# Patient Record
Sex: Female | Born: 1995 | Race: Black or African American | Hispanic: No | Marital: Single | Smoking: Never smoker
Health system: Southern US, Community
[De-identification: ages and names within clinical notes are randomized; demographics above are authoritative.]

## PROBLEM LIST (undated history)

## (undated) DIAGNOSIS — Z3201 Encounter for pregnancy test, result positive: Secondary | ICD-10-CM

## (undated) DIAGNOSIS — E282 Polycystic ovarian syndrome: Secondary | ICD-10-CM

## (undated) DIAGNOSIS — A6000 Herpesviral infection of urogenital system, unspecified: Secondary | ICD-10-CM

## (undated) DIAGNOSIS — R102 Pelvic and perineal pain: Secondary | ICD-10-CM

## (undated) DIAGNOSIS — Z3009 Encounter for other general counseling and advice on contraception: Principal | ICD-10-CM

## (undated) DIAGNOSIS — K219 Gastro-esophageal reflux disease without esophagitis: Secondary | ICD-10-CM

## (undated) DIAGNOSIS — R109 Unspecified abdominal pain: Secondary | ICD-10-CM

## (undated) DIAGNOSIS — N912 Amenorrhea, unspecified: Principal | ICD-10-CM

## (undated) DIAGNOSIS — A749 Chlamydial infection, unspecified: Secondary | ICD-10-CM

## (undated) HISTORY — DX: Unspecified abdominal pain: R10.9

## (undated) HISTORY — DX: Amenorrhea, unspecified: N91.2

## (undated) HISTORY — DX: Encounter for pregnancy test, result positive: Z32.01

## (undated) HISTORY — DX: Pelvic and perineal pain: R10.2

## (undated) HISTORY — DX: Gastro-esophageal reflux disease without esophagitis: K21.9

## (undated) HISTORY — PX: DILATION AND CURETTAGE OF UTERUS: SHX78

## (undated) HISTORY — DX: Encounter for other general counseling and advice on contraception: Z30.09

## (undated) HISTORY — DX: Polycystic ovarian syndrome: E28.2

---

## 2013-10-17 ENCOUNTER — Emergency Department (HOSPITAL_COMMUNITY)
Admission: EM | Admit: 2013-10-17 | Discharge: 2013-10-17 | Disposition: A | Discharge status: Home / Self Care | Payer: Medicaid Other | Attending: Emergency Medicine | Admitting: Emergency Medicine

## 2013-10-17 ENCOUNTER — Encounter (HOSPITAL_COMMUNITY): Payer: Self-pay | Admitting: Emergency Medicine

## 2013-10-17 DIAGNOSIS — N9089 Other specified noninflammatory disorders of vulva and perineum: Secondary | ICD-10-CM | POA: Insufficient documentation

## 2013-10-17 DIAGNOSIS — N907 Vulvar cyst: Secondary | ICD-10-CM

## 2013-10-17 DIAGNOSIS — F17200 Nicotine dependence, unspecified, uncomplicated: Secondary | ICD-10-CM | POA: Insufficient documentation

## 2013-10-17 DIAGNOSIS — Z8619 Personal history of other infectious and parasitic diseases: Secondary | ICD-10-CM | POA: Insufficient documentation

## 2013-10-17 DIAGNOSIS — Z9889 Other specified postprocedural states: Secondary | ICD-10-CM | POA: Insufficient documentation

## 2013-10-17 HISTORY — DX: Chlamydial infection, unspecified: A74.9

## 2013-10-17 LAB — WET PREP, GENITAL
Clue Cells Wet Prep HPF POC: NONE SEEN
Trich, Wet Prep: NONE SEEN
WBC WET PREP: NONE SEEN
Yeast Wet Prep HPF POC: NONE SEEN

## 2013-10-17 MED ORDER — CEPHALEXIN 500 MG PO CAPS: 500.0000 mg | ORAL_CAPSULE | Freq: Three times a day (TID) | ORAL | Status: DC

## 2013-10-17 NOTE — Discharge Instructions (Signed)
Your exam today shows that you have a small cystic area of the right labia. We are starting you on antibiotics to cover for possible infection. You will need to follow up with Dr. Emelda FearFerguson for your pap smear and yearly physical and recheck of the cystic area. Return here as needed for problems.

## 2013-10-17 NOTE — ED Notes (Signed)
Pt noticed "sore" on right inner labia yesterday. No pain unlesspalpated. Denies drainage. Admits to unprotected intercourse

## 2013-10-17 NOTE — ED Notes (Signed)
Pt with "bump on rt labia/vagina". States she had unprotected sex last week. Painful only to touch.

## 2013-10-17 NOTE — ED Provider Notes (Signed)
CSN: [id number]     Arrival date & time 10/17/13  1025 History  This chart was scribed for non-physician practitioner, Kerrie BuffaloHope Fatumata Kashani, FNP,working with Joya Gaskinsonald W Wickline, MD, by Karle PlumberJennifer Tensley, ED Scribe.  This patient was seen in room APFT23/APFT23 and the patient's care was started at 11:36 AM.  Chief Complaint  Patient presents with  . Abscess   The history is provided by the patient. No language interpreter was used.   HPI Comments:  Courtney Zhang is a 18 y.o. female who presents to the Emergency Department complaining of an abscess on the inside of her right labia onset yesterday. She reports associated pain with palpation of the area. Pt reports that she feels as if she has to urinate but cannot. She also reports a slight vaginal discharge. She states her last pap smear was 2 years ago and was diagnosed with chlamydia in which she was treated. She reports one pregnancy which resulted in a still birth at this same time. Pt reports a new sexual partner within the past month and states they have had unprotected sex. She denies dysuria, fever, chills, abdominal pain, back pain, nausea, vomiting, HA, dizziness, vaginal bleeding, otalgia, visual changes, swelling of hands or feet. She states her LMP was 09/25/13.   Past Medical History  Diagnosis Date  . Chlamydia    Past Surgical History  Procedure Laterality Date  . Dilation and curettage of uterus     History reviewed. No pertinent family history. History  Substance Use Topics  . Smoking status: Current Every Day Smoker    Types: Cigars  . Smokeless tobacco: Not on file  . Alcohol Use: No   OB History   Grav Para Term Preterm Abortions TAB SAB Ect Mult Living   1 0             Review of Systems  Constitutional: Negative for fever and chills.  HENT: Negative for ear pain.   Eyes: Negative for visual disturbance.  Gastrointestinal: Negative for nausea, vomiting and abdominal pain.  Genitourinary: Positive for urgency, vaginal  discharge and genital sores (abscess on inner labia). Negative for dysuria and vaginal bleeding.  Musculoskeletal: Negative for back pain.  Neurological: Negative for dizziness and headaches.  All other systems reviewed and are negative.   Allergies  Review of patient's allergies indicates no known allergies.  Home Medications   Prior to Admission medications   Not on File   Triage Vitals: BP 135/78  Pulse 80  Temp(Src) 97.9 F (36.6 C) (Oral)  Resp 19  SpO2 100%  LMP 09/23/2013 Physical Exam  Nursing note and vitals reviewed. Constitutional: She is oriented to person, place, and time. She appears well-developed and well-nourished.  Eyes: EOM are normal.  Neck: Neck supple.  Pulmonary/Chest: Effort normal.  Abdominal: Soft. There is no tenderness.  Genitourinary:  A BB sized cystic area in right labia. External genitalia without lesions. White discharge in vaginal vault. No CMT. No adnexal tenderness. No mass palpated. Uterus without palpated enlargement.  Musculoskeletal: Normal range of motion.  Neurological: She is alert and oriented to person, place, and time. No cranial nerve deficit.  Skin: Skin is warm and dry.    ED Course  Procedures (including critical care time) DIAGNOSTIC STUDIES: Oxygen Saturation is 100% on RA, normal by my interpretation.   COORDINATION OF CARE: 11:45 AM- Will perform pelvic exam. Pt verbalizes understanding and agrees to plan.  Medications - No data to display Results for orders placed during the hospital encounter  of 10/17/13 (from the past 24 hour(s))  WET PREP, GENITAL     Status: None   Collection Time    10/17/13 12:15 PM      Result Value Ref Range   Yeast Wet Prep HPF POC NONE SEEN  NONE SEEN   Trich, Wet Prep NONE SEEN  NONE SEEN   Clue Cells Wet Prep HPF POC NONE SEEN  NONE SEEN   WBC, Wet Prep HPF POC NONE SEEN  NONE SEEN     MDM  18 y.o. female with tenderness and swelling to the right labia x 24 hours. Will start  antibiotics and she will use sitz baths. She will follow up with Dr. Emelda FearFerguson for her pap smear and re check of the area. She will return here as needed for problems. I have reviewed this patient's vital signs, nurses notes, appropriate labs and discussed findings and plan of care with the patient. She voices understanding and agrees to plan.    Medication List         cephALEXin 500 MG capsule  Commonly known as:  KEFLEX  Take 1 capsule (500 mg total) by mouth 3 (three) times daily.        I personally performed the services described in this documentation, which was scribed in my presence. The recorded information has been reviewed and is accurate.    [facility] Orlene OchM Jovanni Eckhart, [place]NP 10/17/13 [phone]

## 2013-10-18 LAB — GC/CHLAMYDIA PROBE AMP
CT PROBE, AMP APTIMA: NEGATIVE
GC PROBE AMP APTIMA: NEGATIVE

## 2013-10-18 NOTE — ED Provider Notes (Signed)
Medical screening examination/treatment/procedure(s) were performed by non-physician practitioner and as supervising physician I was immediately available for consultation/collaboration.   EKG Interpretation None        Eliu Batch W Micholas Drumwright, MD 10/18/13 1243 

## 2013-10-27 ENCOUNTER — Encounter: Payer: Self-pay | Admitting: Adult Health

## 2013-11-07 ENCOUNTER — Encounter: Payer: Self-pay | Admitting: Adult Health

## 2013-11-07 ENCOUNTER — Ambulatory Visit (INDEPENDENT_AMBULATORY_CARE_PROVIDER_SITE_OTHER): Payer: Medicaid Other | Admitting: Adult Health

## 2013-11-07 VITALS — BP 120/86 | Ht 68.0 in | Wt 250.0 lb

## 2013-11-07 DIAGNOSIS — Z3009 Encounter for other general counseling and advice on contraception: Secondary | ICD-10-CM

## 2013-11-07 HISTORY — DX: Encounter for other general counseling and advice on contraception: Z30.09

## 2013-11-07 NOTE — Patient Instructions (Signed)
Etonogestrel implant What is this medicine? ETONOGESTREL (et oh noe JES trel) is a contraceptive (birth control) device. It is used to prevent pregnancy. It can be used for up to 3 years. This medicine may be used for other purposes; ask your health care provider or pharmacist if you have questions. COMMON BRAND NAME(S): Implanon, Nexplanon  What should I tell my health care provider before I take this medicine? They need to know if you have any of these conditions: -abnormal vaginal bleeding -blood vessel disease or blood clots -cancer of the breast, cervix, or liver -depression -diabetes -gallbladder disease -headaches -heart disease or recent heart attack -high blood pressure -high cholesterol -kidney disease -liver disease -renal disease -seizures -tobacco smoker -an unusual or allergic reaction to etonogestrel, other hormones, anesthetics or antiseptics, medicines, foods, dyes, or preservatives -pregnant or trying to get pregnant -breast-feeding How should I use this medicine? This device is inserted just under the skin on the inner side of your upper arm by a health care professional. Talk to your pediatrician regarding the use of this medicine in children. Special care may be needed. Overdosage: If you think you've taken too much of this medicine contact a poison control center or emergency room at once. Overdosage: If you think you have taken too much of this medicine contact a poison control center or emergency room at once. NOTE: This medicine is only for you. Do not share this medicine with others. What if I miss a dose? This does not apply. What may interact with this medicine? Do not take this medicine with any of the following medications: -amprenavir -bosentan -fosamprenavir This medicine may also interact with the following medications: -barbiturate medicines for inducing sleep or treating seizures -certain medicines for fungal infections like ketoconazole and  itraconazole -griseofulvin -medicines to treat seizures like carbamazepine, felbamate, oxcarbazepine, phenytoin, topiramate -modafinil -phenylbutazone -rifampin -some medicines to treat HIV infection like atazanavir, indinavir, lopinavir, nelfinavir, tipranavir, ritonavir -St. John's wort This list may not describe all possible interactions. Give your health care provider a list of all the medicines, herbs, non-prescription drugs, or dietary supplements you use. Also tell them if you smoke, drink alcohol, or use illegal drugs. Some items may interact with your medicine. What should I watch for while using this medicine? This product does not protect you against HIV infection (AIDS) or other sexually transmitted diseases. You should be able to feel the implant by pressing your fingertips over the skin where it was inserted. Tell your doctor if you cannot feel the implant. What side effects may I notice from receiving this medicine? Side effects that you should report to your doctor or health care professional as soon as possible: -allergic reactions like skin rash, itching or hives, swelling of the face, lips, or tongue -breast lumps -changes in vision -confusion, trouble speaking or understanding -dark urine -depressed mood -general ill feeling or flu-like symptoms -light-colored stools -loss of appetite, nausea -right upper belly pain -severe headaches -severe pain, swelling, or tenderness in the abdomen -shortness of breath, chest pain, swelling in a leg -signs of pregnancy -sudden numbness or weakness of the face, arm or leg -trouble walking, dizziness, loss of balance or coordination -unusual vaginal bleeding, discharge -unusually weak or tired -yellowing of the eyes or skin Side effects that usually do not require medical attention (Report these to your doctor or health care professional if they continue or are bothersome.): -acne -breast pain -changes in  weight -cough -fever or chills -headache -irregular menstrual bleeding -itching, burning,   and vaginal discharge -pain or difficulty passing urine -sore throat This list may not describe all possible side effects. Call your doctor for medical advice about side effects. You may report side effects to FDA at 1-800-FDA-1088. Where should I keep my medicine? This drug is given in a hospital or clinic and will not be stored at home. NOTE: This sheet is a summary. It may not cover all possible information. If you have questions about this medicine, talk to your doctor, pharmacist, or health care provider.  2014, Elsevier/Gold Standard. (2011-12-01 15:37:45) No sex x 2 weeks before nexplanon Return in 4 weeks for stat [facility] in am and insertion in pm

## 2013-11-07 NOTE — Progress Notes (Signed)
Subjective:     Patient ID: Courtney Zhang, female   DOB: [birth year], 18 y.o.   MRN: [id number]  HPI  Courtney Zhang is a 18 year old black female in to discuss birth control, she is G1P0A1, had at 16 weeks and had D&C.She is not currently having sex, just moved to area.  Review of Systems See HPI Reviewed past medical,surgical, social and family history. Reviewed medications and allergies.     Objective:   Physical Exam BP 120/86  Ht 5\' 8"  (1.727 m)  Wt 250 lb (113.399 kg)  BMI 38.02 kg/m2  LMP 10/28/2013   Talk only, wants birth control, but not sure which one,discussed OCs, depo, ring, nexplanon and IUD and she wants nexplanon.Discussed always using condoms for STD prevention, she said she had chlamydia when she was pregnant.   Assessment:     Contraceptive counseling    Plan:    Order nexplanon Return in 4 weeks for stat [facility] in am and insertion in pm No sex x 2 weeks before insertion if not on period   Review handout on nexplanon

## 2013-12-05 ENCOUNTER — Encounter: Payer: Medicaid Other | Admitting: Adult Health

## 2013-12-05 ENCOUNTER — Other Ambulatory Visit: Payer: Medicaid Other

## 2013-12-08 ENCOUNTER — Encounter: Payer: Medicaid Other | Admitting: Adult Health

## 2013-12-08 ENCOUNTER — Other Ambulatory Visit: Payer: Medicaid Other

## 2014-01-02 ENCOUNTER — Encounter: Payer: Medicaid Other | Admitting: Adult Health

## 2014-01-02 ENCOUNTER — Other Ambulatory Visit: Payer: Medicaid Other

## 2014-02-02 ENCOUNTER — Emergency Department (HOSPITAL_COMMUNITY)
Admission: EM | Admit: 2014-02-02 | Discharge: 2014-02-02 | Disposition: A | Discharge status: Home / Self Care | Payer: Medicaid Other | Attending: Emergency Medicine | Admitting: Emergency Medicine

## 2014-02-02 ENCOUNTER — Encounter (HOSPITAL_COMMUNITY): Payer: Self-pay | Admitting: Emergency Medicine

## 2014-02-02 DIAGNOSIS — Z3200 Encounter for pregnancy test, result unknown: Secondary | ICD-10-CM | POA: Diagnosis present

## 2014-02-02 DIAGNOSIS — Z8619 Personal history of other infectious and parasitic diseases: Secondary | ICD-10-CM | POA: Insufficient documentation

## 2014-02-02 DIAGNOSIS — Z3202 Encounter for pregnancy test, result negative: Secondary | ICD-10-CM | POA: Diagnosis not present

## 2014-02-02 DIAGNOSIS — N926 Irregular menstruation, unspecified: Secondary | ICD-10-CM | POA: Insufficient documentation

## 2014-02-02 DIAGNOSIS — Z87891 Personal history of nicotine dependence: Secondary | ICD-10-CM | POA: Diagnosis not present

## 2014-02-02 DIAGNOSIS — N390 Urinary tract infection, site not specified: Secondary | ICD-10-CM | POA: Diagnosis not present

## 2014-02-02 DIAGNOSIS — R42 Dizziness and giddiness: Secondary | ICD-10-CM | POA: Insufficient documentation

## 2014-02-02 DIAGNOSIS — N939 Abnormal uterine and vaginal bleeding, unspecified: Secondary | ICD-10-CM | POA: Insufficient documentation

## 2014-02-02 LAB — URINALYSIS, ROUTINE W REFLEX MICROSCOPIC
Bilirubin Urine: NEGATIVE
Glucose, UA: NEGATIVE mg/dL
HGB URINE DIPSTICK: NEGATIVE
Ketones, ur: NEGATIVE mg/dL
Nitrite: NEGATIVE
SPECIFIC GRAVITY, URINE: 1.02 (ref 1.005–1.030)
UROBILINOGEN UA: 1 mg/dL (ref 0.0–1.0)
pH: 7 (ref 5.0–8.0)

## 2014-02-02 LAB — URINE MICROSCOPIC-ADD ON

## 2014-02-02 LAB — POC URINE PREG, ED: Preg Test, Ur: NEGATIVE

## 2014-02-02 MED ORDER — CEPHALEXIN 500 MG PO CAPS: 500.0000 mg | ORAL_CAPSULE | Freq: Four times a day (QID) | ORAL | Status: DC

## 2014-02-02 MED ORDER — CEPHALEXIN 500 MG PO CAPS
500.0000 mg | ORAL_CAPSULE | Freq: Once | ORAL | Status: AC
  Administered 2014-02-02: 500 mg via ORAL
  Filled 2014-02-02: qty 1

## 2014-02-02 NOTE — Discharge Instructions (Signed)

## 2014-02-02 NOTE — ED Notes (Signed)
Pt ready for d/c when seen by me.  Alert, NAD 

## 2014-02-02 NOTE — ED Notes (Addendum)
Pt states she started feeling dizzy yesterday with nausea, pt states has had pain in her abdomen as well that comes and goes, denies pain at the moment. Pt has not had a period in 2 months, states she thinks she is pregnant.

## 2014-02-04 NOTE — ED Provider Notes (Signed)
CSN: [id number]     Arrival date & time 02/02/14  1548 History   First MD Initiated Contact with Patient 02/02/14 1730     Chief Complaint  Patient presents with  . Possible Pregnancy   Courtney Zhang is a 18 y.o. female who presents to the Emergency Department complaining of intermittent abdominal cramping for several days and also reports having developed dizziness and nausea one day prior to ED arrival.  She states that she has missed her period for two months and is concerned that she may be pregnant and is requesting a pregnancy test.  She denies pain at this time, recent fever, vomiting, vaginal discharge, urinary frequency, or back pain   (Consider location/radiation/quality/duration/timing/severity/associated sxs/prior Treatment) Patient is a 18 y.o. female presenting with pregnancy problem.  Possible Pregnancy  Primary symptoms include abdominal pain. Patient reports no vaginal bleeding and no vaginal discharge.  Associated symptoms include nausea.  Associated symptoms include no dysuria, no fever, no shortness of breath, no vomiting and no weakness.    Past Medical History  Diagnosis Date  . Chlamydia   . Contraceptive education 11/07/2013   Past Surgical History  Procedure Laterality Date  . Dilation and curettage of uterus     Family History  Problem Relation Age of Onset  . Asthma Brother   . Diabetes Maternal Grandmother   . Hypertension Maternal Grandmother   . Diabetes Maternal Grandfather   . Diabetes Paternal Grandmother   . Asthma Brother   . Asthma Brother   . Asthma Brother    History  Substance Use Topics  . Smoking status: Former Smoker    Types: Cigars  . Smokeless tobacco: Never Used  . Alcohol Use: No   OB History   Grav Para Term Preterm Abortions TAB SAB Ect Mult Living   1 0        0     Review of Systems  Constitutional: Negative for fever, chills and fatigue.  HENT: Negative for sore throat and trouble swallowing.   Respiratory:  Negative for cough, shortness of breath and wheezing.   Cardiovascular: Negative for chest pain and palpitations.  Gastrointestinal: Positive for nausea and abdominal pain. Negative for vomiting and blood in stool.  Endocrine: Negative for polydipsia and polyuria.  Genitourinary: Positive for menstrual problem. Negative for dysuria, urgency, hematuria, flank pain, vaginal bleeding, vaginal discharge, difficulty urinating and pelvic pain.  Musculoskeletal: Negative for arthralgias, back pain, myalgias, neck pain and neck stiffness.  Skin: Negative for rash.  Neurological: Positive for dizziness. Negative for weakness and numbness.  Hematological: Does not bruise/bleed easily.      Allergies  Review of patient's allergies indicates no known allergies.  Home Medications   Prior to Admission medications   Medication Sig Start Date End Date Taking? Authorizing Provider  cephALEXin (KEFLEX) 500 MG capsule Take 1 capsule (500 mg total) by mouth 4 (four) times daily. For 7 days 02/02/14   Kaisley Stiverson L. Romie Tay, PA-C   BP 125/89  Pulse 85  Temp(Src) 98.7 F (37.1 C) (Oral)  Resp 17  Ht  (1.727 m)  Wt 246 lb (111.585 kg)  BMI 37.41 kg/m2  SpO2 100%  LMP 11/26/2013 Physical Exam  Nursing note and vitals reviewed. Constitutional: She is oriented to person, place, and time. She appears well-developed and well-nourished. No distress.  HENT:  Head: Normocephalic and atraumatic.  Neck: Normal range of motion. Neck supple.  Cardiovascular: Normal rate, regular rhythm, normal heart sounds and intact distal pulses.  No murmur heard. Pulmonary/Chest: Effort normal and breath sounds normal. No respiratory distress.  Abdominal: Soft. She exhibits no distension and no mass. There is no tenderness. There is no rebound and no guarding.  Musculoskeletal: Normal range of motion. She exhibits no tenderness.  Lymphadenopathy:    She has no cervical adenopathy.  Neurological: She is alert and  oriented to person, place, and time. She exhibits normal muscle tone. Coordination normal.  Skin: Skin is warm and dry. No rash noted.  Psychiatric: She has a normal mood and affect. Her behavior is normal. Thought content normal.    ED Course  Procedures (including critical care time) Labs Review Labs Reviewed  URINALYSIS, ROUTINE W REFLEX MICROSCOPIC - Abnormal; Notable for the following:    Protein, ur TRACE (*)    Leukocytes, UA TRACE (*)    All other components within normal limits  URINE MICROSCOPIC-ADD ON - Abnormal; Notable for the following:    Squamous Epithelial / LPF MANY (*)    Bacteria, UA FEW (*)    All other components within normal limits  URINE CULTURE  POC URINE PREG, ED    Imaging Review No results found.   EKG Interpretation None       Urine culture pending MDM   Final diagnoses:  Urinary tract infection, acute     Pt is well appearing, non-toxic.  No CVA tenderness, fever or vomiting to suggest pyelonephritis.  No abdominal pain on exam.  Pt came to ED for pregnancy test .  Sx's are likely related to the UTI.  She agrees to antibiotic, plenty of fluids and close PMD f/u and also given restrict return precautions.  She appears stable for d/c   Nayan Proch L. Trisha Mangle, PA-C 02/04/14 2232

## 2014-02-05 LAB — URINE CULTURE

## 2014-02-06 ENCOUNTER — Encounter (HOSPITAL_COMMUNITY): Payer: Self-pay | Admitting: Emergency Medicine

## 2014-02-06 ENCOUNTER — Emergency Department (HOSPITAL_COMMUNITY)
Admission: EM | Admit: 2014-02-06 | Discharge: 2014-02-06 | Disposition: A | Discharge status: Home / Self Care | Payer: Medicaid Other | Attending: Emergency Medicine | Admitting: Emergency Medicine

## 2014-02-06 DIAGNOSIS — R079 Chest pain, unspecified: Secondary | ICD-10-CM | POA: Insufficient documentation

## 2014-02-06 DIAGNOSIS — R1110 Vomiting, unspecified: Secondary | ICD-10-CM | POA: Diagnosis present

## 2014-02-06 DIAGNOSIS — Z3202 Encounter for pregnancy test, result negative: Secondary | ICD-10-CM | POA: Diagnosis not present

## 2014-02-06 DIAGNOSIS — B9789 Other viral agents as the cause of diseases classified elsewhere: Secondary | ICD-10-CM | POA: Diagnosis not present

## 2014-02-06 DIAGNOSIS — B349 Viral infection, unspecified: Secondary | ICD-10-CM

## 2014-02-06 DIAGNOSIS — Z87891 Personal history of nicotine dependence: Secondary | ICD-10-CM | POA: Diagnosis not present

## 2014-02-06 LAB — URINALYSIS, ROUTINE W REFLEX MICROSCOPIC
Bilirubin Urine: NEGATIVE
Glucose, UA: NEGATIVE mg/dL
Hgb urine dipstick: NEGATIVE
Ketones, ur: NEGATIVE mg/dL
Leukocytes, UA: NEGATIVE
NITRITE: NEGATIVE
Protein, ur: NEGATIVE mg/dL
Specific Gravity, Urine: 1.015 (ref 1.005–1.030)
UROBILINOGEN UA: 1 mg/dL (ref 0.0–1.0)
pH: 6.5 (ref 5.0–8.0)

## 2014-02-06 LAB — PREGNANCY, URINE: PREG TEST UR: NEGATIVE

## 2014-02-06 MED ORDER — IBUPROFEN 400 MG PO TABS
600.0000 mg | ORAL_TABLET | Freq: Once | ORAL | Status: AC
  Administered 2014-02-06: 600 mg via ORAL
  Filled 2014-02-06: qty 2

## 2014-02-06 MED ORDER — ONDANSETRON 4 MG PO TBDP
4.0000 mg | ORAL_TABLET | Freq: Once | ORAL | Status: AC
  Administered 2014-02-06: 4 mg via ORAL
  Filled 2014-02-06: qty 1

## 2014-02-06 MED ORDER — ONDANSETRON HCL 4 MG PO TABS: 4.0000 mg | ORAL_TABLET | Freq: Four times a day (QID) | ORAL | Status: DC | PRN

## 2014-02-06 NOTE — ED Provider Notes (Addendum)
TIME SEEN: 6:50 PM  CHIEF COMPLAINT: Cough, congestion, chest pain with coughing, vomiting, body aches  HPI: Pt is a 18 y.o. F with no significant past medical history who presents to the emergency department with complaints of 3 days of cough, congestion, myalgias, vomiting. She is having some chest pain with coughing. No shortness of breath. No sick contacts. No diarrhea. No abdominal pain. Denies any documented fevers or chills. States her last period was June 20 but she does have an irregular menstrual cycle. Denies dysuria or hematuria. No recent travel.  ROS: See HPI Constitutional: no fever  Eyes: no drainage  ENT: no runny nose   Cardiovascular:  no chest pain  Resp: no SOB  GI: no vomiting GU: no dysuria Integumentary: no rash  Allergy: no hives  Musculoskeletal: no leg swelling  Neurological: no slurred speech ROS otherwise negative  PAST MEDICAL HISTORY/PAST SURGICAL HISTORY:  Past Medical History  Diagnosis Date  . Chlamydia   . Contraceptive education 11/07/2013    MEDICATIONS:  Prior to Admission medications   Medication Sig Start Date End Date Taking? Authorizing Provider  cephALEXin (KEFLEX) 500 MG capsule Take 1 capsule (500 mg total) by mouth 4 (four) times daily. For 7 days 02/02/14  Yes Tammy L. Triplett, PA-C    ALLERGIES:  No Known Allergies  SOCIAL HISTORY:  History  Substance Use Topics  . Smoking status: Former Smoker    Types: Cigars  . Smokeless tobacco: Never Used  . Alcohol Use: No    FAMILY HISTORY: Family History  Problem Relation Age of Onset  . Asthma Brother   . Diabetes Maternal Grandmother   . Hypertension Maternal Grandmother   . Diabetes Maternal Grandfather   . Diabetes Paternal Grandmother   . Asthma Brother   . Asthma Brother   . Asthma Brother     EXAM: BP 125/85  Pulse 88  Temp(Src) 99.3 F (37.4 C) (Oral)  Resp 20  Ht  (1.727 m)  Wt 246 lb (111.585 kg)  BMI 37.41 kg/m2  SpO2 100%  LMP  11/26/2013 CONSTITUTIONAL: Alert and oriented and responds appropriately to questions. Well-appearing; well-nourished, pleasant, smiling, nontoxic HEAD: Normocephalic EYES: Conjunctivae clear, PERRL ENT: normal nose; no rhinorrhea; moist mucous membranes; pharynx without lesions noted NECK: Supple, no meningismus, no LAD  CARD: RRR; S1 and S2 appreciated; no murmurs, no clicks, no rubs, no gallops RESP: Normal chest excursion without splinting or tachypnea; breath sounds clear and equal bilaterally; no wheezes, no rhonchi, no rales, no respiratory distress or hypoxia ABD/GI: Normal bowel sounds; non-distended; soft, non-tender, no rebound, no guarding BACK:  The back appears normal and is non-tender to palpation, there is no CVA tenderness EXT: Normal ROM in all joints; non-tender to palpation; no edema; normal capillary refill; no cyanosis    SKIN: Normal color for age and race; warm NEURO: Moves all extremities equally PSYCH: The patient's mood and manner are appropriate. Grooming and personal hygiene are appropriate.  MEDICAL DECISION MAKING: Patient here with likely viral syndrome. She is very well-appearing on exam. She is hemodynamically stable. She does not appear dehydrated. There is no respiratory distress or hypoxia. I do not feel she needs labs or imaging at this time as I do not feel he would change my management for her disposition. Will give ibuprofen and Zofran. I feel she is safe to be discharged home. Have discussed with her supportive care instructions and return precautions. She verbalizes understanding is comfortable with plan. Urine sent in triage is  no sign of infection. Urine pregnancy test negative.  Doubt pulmonary embolus as the cause of her chest pain she is PERC negative.  She has no risk factors for ACS. Doubt dissection as she is well-appearing with no back or abdominal pain, equal pulses in all of her extremities.         Layla Maw Sameka Bagent, DO 02/06/14  1859  Layla Maw Dennard Vezina, DO 02/06/14 1901

## 2014-02-06 NOTE — ED Provider Notes (Signed)
Medical screening examination/treatment/procedure(s) were performed by non-physician practitioner and as supervising physician I was immediately available for consultation/collaboration.   EKG Interpretation None        Lesleyanne Politte N Kimbely Whiteaker, DO 02/06/14 1540 

## 2014-02-06 NOTE — ED Notes (Signed)
Pt reports cough and congestion x 3 days.  Reports started vomiting yesterday.  C/O chest pain with deep breaths and coughing.

## 2014-02-06 NOTE — Discharge Instructions (Signed)
You may alternate between ibuprofen 800 mg every hours as needed for pain and Tylenol 1000 mg every 6 hours as needed for pain. With these medications are found over-the-counter.   Viral Infections A viral infection can be caused by different types of viruses.Most viral infections are not serious and resolve on their own. However, some infections may cause severe symptoms and may lead to further complications. SYMPTOMS Viruses can frequently cause:  Minor sore throat.  Aches and pains.  Headaches.  Runny nose.  Different types of rashes.  Watery eyes.  Tiredness.  Cough.  Loss of appetite.  Gastrointestinal infections, resulting in nausea, vomiting, and diarrhea. These symptoms do not respond to antibiotics because the infection is not caused by bacteria. However, you might catch a bacterial infection following the viral infection. This is sometimes called a "superinfection." Symptoms of such a bacterial infection may include:  Worsening sore throat with pus and difficulty swallowing.  Swollen neck glands.  Chills and a high or persistent fever.  Severe headache.  Tenderness over the sinuses.  Persistent overall ill feeling (malaise), muscle aches, and tiredness (fatigue).  Persistent cough.  Yellow, green, or brown mucus production with coughing. HOME CARE INSTRUCTIONS   Only take over-the-counter or prescription medicines for pain, discomfort, diarrhea, or fever as directed by your caregiver.  Drink enough water and fluids to keep your urine clear or pale yellow. Sports drinks can provide valuable electrolytes, sugars, and hydration.  Get plenty of rest and maintain proper nutrition. Soups and broths with crackers or rice are fine. SEEK IMMEDIATE MEDICAL CARE IF:   You have severe headaches, shortness of breath, chest pain, neck pain, or an unusual rash.  You have uncontrolled vomiting, diarrhea, or you are unable to keep down fluids.  You or your child has  an oral temperature above 102 F (38.9 C), not controlled by medicine.  Your baby is older than 3 months with a rectal temperature of 102 F (38.9 C) or higher.  Your baby is 8 months old or younger with a rectal temperature of 100.4 F (38 C) or higher. MAKE SURE YOU:   Understand these instructions.  Will watch your condition.  Will get help right away if you are not doing well or get worse. Document Released: 03/05/2005 Document Revised: 08/18/2011 Document Reviewed: 09/30/2010 [facility] Patient Information 2015 [place], [place]. This information is not intended to replace advice given to you by your health care provider. Make sure you discuss any questions you have with your health care provider.

## 2014-02-15 ENCOUNTER — Encounter (HOSPITAL_COMMUNITY): Payer: Self-pay | Admitting: Emergency Medicine

## 2014-02-15 ENCOUNTER — Emergency Department (HOSPITAL_COMMUNITY)
Admission: EM | Admit: 2014-02-15 | Discharge: 2014-02-15 | Disposition: A | Discharge status: Home / Self Care | Payer: Medicaid Other | Attending: Emergency Medicine | Admitting: Emergency Medicine

## 2014-02-15 DIAGNOSIS — N72 Inflammatory disease of cervix uteri: Secondary | ICD-10-CM | POA: Diagnosis not present

## 2014-02-15 DIAGNOSIS — B3731 Acute candidiasis of vulva and vagina: Secondary | ICD-10-CM | POA: Insufficient documentation

## 2014-02-15 DIAGNOSIS — B9689 Other specified bacterial agents as the cause of diseases classified elsewhere: Secondary | ICD-10-CM | POA: Insufficient documentation

## 2014-02-15 DIAGNOSIS — B373 Candidiasis of vulva and vagina: Secondary | ICD-10-CM | POA: Insufficient documentation

## 2014-02-15 DIAGNOSIS — Z792 Long term (current) use of antibiotics: Secondary | ICD-10-CM | POA: Insufficient documentation

## 2014-02-15 DIAGNOSIS — Z87891 Personal history of nicotine dependence: Secondary | ICD-10-CM | POA: Insufficient documentation

## 2014-02-15 DIAGNOSIS — A499 Bacterial infection, unspecified: Secondary | ICD-10-CM | POA: Insufficient documentation

## 2014-02-15 DIAGNOSIS — Z79899 Other long term (current) drug therapy: Secondary | ICD-10-CM | POA: Diagnosis not present

## 2014-02-15 DIAGNOSIS — N760 Acute vaginitis: Secondary | ICD-10-CM | POA: Insufficient documentation

## 2014-02-15 DIAGNOSIS — Z3202 Encounter for pregnancy test, result negative: Secondary | ICD-10-CM | POA: Diagnosis not present

## 2014-02-15 DIAGNOSIS — N898 Other specified noninflammatory disorders of vagina: Secondary | ICD-10-CM | POA: Diagnosis present

## 2014-02-15 LAB — URINALYSIS, ROUTINE W REFLEX MICROSCOPIC
BILIRUBIN URINE: NEGATIVE
Glucose, UA: NEGATIVE mg/dL
HGB URINE DIPSTICK: NEGATIVE
Ketones, ur: NEGATIVE mg/dL
Leukocytes, UA: NEGATIVE
Nitrite: NEGATIVE
PH: 6 (ref 5.0–8.0)
Protein, ur: NEGATIVE mg/dL
SPECIFIC GRAVITY, URINE: 1.015 (ref 1.005–1.030)
Urobilinogen, UA: 0.2 mg/dL (ref 0.0–1.0)

## 2014-02-15 LAB — WET PREP, GENITAL
Trich, Wet Prep: NONE SEEN
WBC, Wet Prep HPF POC: NONE SEEN
Yeast Wet Prep HPF POC: NONE SEEN

## 2014-02-15 LAB — POC URINE PREG, ED: PREG TEST UR: NEGATIVE

## 2014-02-15 MED ORDER — NYSTATIN-TRIAMCINOLONE 100000-0.1 UNIT/GM-% EX CREA
TOPICAL_CREAM | CUTANEOUS | Status: DC
Start: 2014-02-15 — End: 2014-07-14

## 2014-02-15 MED ORDER — AZITHROMYCIN 250 MG PO TABS
1000.0000 mg | ORAL_TABLET | Freq: Once | ORAL | Status: AC
  Administered 2014-02-15: 1000 mg via ORAL
  Filled 2014-02-15: qty 4

## 2014-02-15 MED ORDER — METRONIDAZOLE 500 MG PO TABS: 500.0000 mg | ORAL_TABLET | Freq: Two times a day (BID) | ORAL | Status: DC

## 2014-02-15 MED ORDER — FLUCONAZOLE 100 MG PO TABS
150.0000 mg | ORAL_TABLET | Freq: Once | ORAL | Status: AC
  Administered 2014-02-15: 150 mg via ORAL
  Filled 2014-02-15: qty 2

## 2014-02-15 NOTE — ED Provider Notes (Signed)
CSN: [id number]     Arrival date & time 02/15/14  1116 History   First MD Initiated Contact with Patient 02/15/14 1157     Chief Complaint  Patient presents with  . Vaginal Discharge     (Consider location/radiation/quality/duration/timing/severity/associated sxs/prior Treatment) Patient is a 18 y.o. female presenting with vaginal discharge. The history is provided by the patient.  Vaginal Discharge Quality:  White Severity:  Moderate Onset quality:  Gradual Duration:  1 day Timing:  Constant Progression:  Worsening Chronicity:  New Context: spontaneously   Relieved by:  Nothing Worsened by:  Nothing tried Ineffective treatments:  None tried Associated symptoms: abdominal pain, dyspareunia, dysuria and vaginal itching   Associated symptoms: no fever, no nausea, no urinary incontinence and no vomiting    Courtney Zhang is a 18 y.o. female who presents to the ED with vaginal discharge and spotting. She states that the spotting started 2 days ago followed by a white discharge yesterday. She reports pain with intercourse. LMP 11/26/13, no birth control. Last pap smear over 2 years ago and was normal. Hx of Chlamydia 2 years ago. Current sex partner x 2 months.  Past Medical History  Diagnosis Date  . Chlamydia   . Contraceptive education 11/07/2013   Past Surgical History  Procedure Laterality Date  . Dilation and curettage of uterus     Family History  Problem Relation Age of Onset  . Asthma Brother   . Diabetes Maternal Grandmother   . Hypertension Maternal Grandmother   . Diabetes Maternal Grandfather   . Diabetes Paternal Grandmother   . Asthma Brother   . Asthma Brother   . Asthma Brother    History  Substance Use Topics  . Smoking status: Former Smoker    Types: Cigars  . Smokeless tobacco: Never Used  . Alcohol Use: No   OB History   Grav Para Term Preterm Abortions TAB SAB Ect Mult Living   1 0        0     Review of Systems  Constitutional: Negative for  fever.  Gastrointestinal: Positive for abdominal pain. Negative for nausea and vomiting.  Genitourinary: Positive for dysuria, vaginal discharge and dyspareunia. Negative for bladder incontinence.      Allergies  Review of patient's allergies indicates no known allergies.  Home Medications   Prior to Admission medications   Medication Sig Start Date End Date Taking? Authorizing Provider  ondansetron (ZOFRAN) 4 MG tablet Take 1 tablet (4 mg total) by mouth every 6 (six) hours as needed for nausea or vomiting. 02/06/14  Yes Kristen N Ward, DO  cephALEXin (KEFLEX) 500 MG capsule Take 1 capsule (500 mg total) by mouth 4 (four) times daily. For 7 days 02/02/14   Tammy L. Triplett, PA-C  metroNIDAZOLE (FLAGYL) 500 MG tablet Take 1 tablet (500 mg total) by mouth 2 (two) times daily. 02/15/14   Lotta Frankenfield Orlene Och, NP  nystatin-triamcinolone (MYCOLOG II) cream Apply to affected area daily 02/15/14   Tamie Minteer Orlene Och, NP   BP 127/91  Pulse 97  Temp(Src) 98.6 F (37 C) (Oral)  Resp 16  Ht  (1.727 m)  Wt 240 lb (108.863 kg)  BMI 36.50 kg/m2  SpO2 100%  LMP 11/26/2013 Physical Exam  Nursing note and vitals reviewed. Constitutional: She is oriented to person, place, and time. She appears well-developed and well-nourished.  HENT:  Head: Normocephalic.  Eyes: Conjunctivae and EOM are normal.  Neck: Normal range of motion. Neck supple.  Cardiovascular: Normal rate.  Pulmonary/Chest: Effort normal.  Abdominal: Soft. There is no tenderness.  Genitourinary:  External genitalia with dry rash area to labia major and to inner aspect of thighs. Mucous discharge vaginal vault, cervix inflamed, positive CMT, no adnexal tenderness, uterus without palpable enlargement.   Musculoskeletal: Normal range of motion.  Neurological: She is alert and oriented to person, place, and time. No cranial nerve deficit.  Skin: Skin is warm and dry.  Psychiatric: She has a normal mood and affect. Her behavior is normal.    Results for orders placed during the hospital encounter of 02/15/14 (from the past 24 hour(s))  URINALYSIS, ROUTINE W REFLEX MICROSCOPIC     Status: None   Collection Time    02/15/14 12:25 PM      Result Value Ref Range   Color, Urine YELLOW  YELLOW   APPearance CLEAR  CLEAR   Specific Gravity, Urine 1.015  1.005 - 1.030   pH 6.0  5.0 - 8.0   Glucose, UA NEGATIVE  NEGATIVE mg/dL   Hgb urine dipstick NEGATIVE  NEGATIVE   Bilirubin Urine NEGATIVE  NEGATIVE   Ketones, ur NEGATIVE  NEGATIVE mg/dL   Protein, ur NEGATIVE  NEGATIVE mg/dL   Urobilinogen, UA 0.2  0.0 - 1.0 mg/dL   Nitrite NEGATIVE  NEGATIVE   Leukocytes, UA NEGATIVE  NEGATIVE  POC URINE PREG, ED     Status: None   Collection Time    02/15/14 12:44 PM      Result Value Ref Range   Preg Test, Ur NEGATIVE  NEGATIVE  WET PREP, GENITAL     Status: Abnormal   Collection Time    02/15/14 12:45 PM      Result Value Ref Range   Yeast Wet Prep HPF POC NONE SEEN  NONE SEEN   Trich, Wet Prep NONE SEEN  NONE SEEN   Clue Cells Wet Prep HPF POC MANY (*) NONE SEEN   WBC, Wet Prep HPF POC NONE SEEN  NONE SEEN    ED Course  Procedures  Labs, Diflucan 150 mg. PO, Zithromax 1 gram PO  MDM  18 y.o. female with vaginal discharge and itching. Will treat for BV and monilia. She will follow up with her GYN. I have reviewed this patient's vital signs, nurses notes, appropriate labs and discussed findings and plan of care with the patient. She voices understanding and agrees with plan.    Medication List    TAKE these medications       metroNIDAZOLE 500 MG tablet  Commonly known as:  FLAGYL  Take 1 tablet (500 mg total) by mouth 2 (two) times daily.     nystatin-triamcinolone cream  Commonly known as:  MYCOLOG II  Apply to affected area daily      ASK your doctor about these medications       cephALEXin 500 MG capsule  Commonly known as:  KEFLEX  Take 1 capsule (500 mg total) by mouth 4 (four) times daily. For 7 days      ondansetron 4 MG tablet  Commonly known as:  ZOFRAN  Take 1 tablet (4 mg total) by mouth every 6 (six) hours as needed for nausea or vomiting.                [facility] Orlene Och, [place] 02/15/14 1650

## 2014-02-15 NOTE — Discharge Instructions (Signed)
We are giving you a cream to use on the outside of your vaginal area and on the inner aspect of your thighs where you have the rash. We are also giving you a pill to take for yeast. Follow up with the health department for your pap smear and a yearly check up. Your wet prep today shows that you have bacterial vaginosis we are treating you with an antibiotic.  Your cultures will not be back for a few days and we will only call if they are positive.

## 2014-02-15 NOTE — ED Notes (Addendum)
Pt reports abdominal pain and intermittent "pinkish"  bleeding after sex and with urination. Pt also reports odor and discharge that started yesterday. Pt denies any n/v/d. Pt reports LMP in June.

## 2014-02-16 LAB — GC/CHLAMYDIA PROBE AMP
CT PROBE, AMP APTIMA: NEGATIVE
GC Probe RNA: NEGATIVE

## 2014-02-16 LAB — HIV ANTIBODY (ROUTINE TESTING W REFLEX): HIV: NONREACTIVE

## 2014-02-16 LAB — RPR

## 2014-02-16 NOTE — ED Provider Notes (Signed)
Medical screening examination/treatment/procedure(s) were performed by non-physician practitioner and as supervising physician I was immediately available for consultation/collaboration.   EKG Interpretation None       Donnetta Hutching, MD 02/16/14 [phone]

## 2014-04-10 ENCOUNTER — Encounter (HOSPITAL_COMMUNITY): Payer: Self-pay | Admitting: Emergency Medicine

## 2014-04-22 ENCOUNTER — Emergency Department (HOSPITAL_COMMUNITY)
Admission: EM | Admit: 2014-04-22 | Discharge: 2014-04-22 | Disposition: A | Discharge status: Home / Self Care | Payer: Medicaid Other | Attending: Emergency Medicine | Admitting: Emergency Medicine

## 2014-04-22 ENCOUNTER — Encounter (HOSPITAL_COMMUNITY): Payer: Self-pay

## 2014-04-22 DIAGNOSIS — R000 Tachycardia, unspecified: Secondary | ICD-10-CM | POA: Diagnosis not present

## 2014-04-22 DIAGNOSIS — Z87891 Personal history of nicotine dependence: Secondary | ICD-10-CM | POA: Insufficient documentation

## 2014-04-22 DIAGNOSIS — N912 Amenorrhea, unspecified: Secondary | ICD-10-CM | POA: Diagnosis not present

## 2014-04-22 DIAGNOSIS — Z8619 Personal history of other infectious and parasitic diseases: Secondary | ICD-10-CM | POA: Diagnosis not present

## 2014-04-22 DIAGNOSIS — R109 Unspecified abdominal pain: Secondary | ICD-10-CM | POA: Diagnosis present

## 2014-04-22 LAB — CBC WITH DIFFERENTIAL/PLATELET
BASOS ABS: 0 10*3/uL (ref 0.0–0.1)
Basophils Relative: 0 % (ref 0–1)
Eosinophils Absolute: 0 10*3/uL (ref 0.0–0.7)
Eosinophils Relative: 0 % (ref 0–5)
HCT: 38.4 % (ref 36.0–46.0)
HEMOGLOBIN: 12.7 g/dL (ref 12.0–15.0)
LYMPHS PCT: 24 % (ref 12–46)
Lymphs Abs: 1.8 10*3/uL (ref 0.7–4.0)
MCH: 27 pg (ref 26.0–34.0)
MCHC: 33.1 g/dL (ref 30.0–36.0)
MCV: 81.5 fL (ref 78.0–100.0)
Monocytes Absolute: 0.3 10*3/uL (ref 0.1–1.0)
Monocytes Relative: 3 % (ref 3–12)
NEUTROS ABS: 5.5 10*3/uL (ref 1.7–7.7)
Neutrophils Relative %: 73 % (ref 43–77)
Platelets: 273 10*3/uL (ref 150–400)
RBC: 4.71 MIL/uL (ref 3.87–5.11)
RDW: 14.8 % (ref 11.5–15.5)
WBC: 7.5 10*3/uL (ref 4.0–10.5)

## 2014-04-22 LAB — RPR

## 2014-04-22 LAB — WET PREP, GENITAL
Clue Cells Wet Prep HPF POC: NONE SEEN
Trich, Wet Prep: NONE SEEN
WBC WET PREP: NONE SEEN
YEAST WET PREP: NONE SEEN

## 2014-04-22 LAB — HCG, QUANTITATIVE, PREGNANCY: hCG, Beta Chain, Quant, S: <1 m[IU]/mL (ref <5)

## 2014-04-22 LAB — URINALYSIS, ROUTINE W REFLEX MICROSCOPIC
Bilirubin Urine: NEGATIVE
Glucose, UA: NEGATIVE mg/dL
HGB URINE DIPSTICK: NEGATIVE
Ketones, ur: NEGATIVE mg/dL
Leukocytes, UA: NEGATIVE
NITRITE: NEGATIVE
PH: 6 (ref 5.0–8.0)
Protein, ur: NEGATIVE mg/dL
SPECIFIC GRAVITY, URINE: 1.02 (ref 1.005–1.030)
UROBILINOGEN UA: 0.2 mg/dL (ref 0.0–1.0)

## 2014-04-22 LAB — HIV ANTIBODY (ROUTINE TESTING W REFLEX): HIV 1&2 Ab, 4th Generation: NONREACTIVE

## 2014-04-22 NOTE — ED Provider Notes (Signed)
CSN: [id number]     Arrival date & time 04/22/14  1155 History   First MD Initiated Contact with Patient 04/22/14 1218     Chief Complaint  Patient presents with  . Abdominal Cramping     (Consider location/radiation/quality/duration/timing/severity/associated sxs/prior Treatment) Patient is a 18 y.o. female presenting with cramps. The history is provided by the patient.  Abdominal Cramping This is a new problem. The current episode started yesterday. The problem occurs constantly. The problem has been gradually worsening. Associated symptoms comments: Amenorrhea .   Courtney Zhang is a 18 y.o. G1P0 who presents to the ED with abdominal cramping that started 2 days. She has not had a period in 5 months and is using no birth control and has had unprotected sex. She did take a home pregnancy test that was negative. Hx of Chlamydia. Last pap smear was due last month but she did not go for her appointment at the health department so they rescheduled for the first of the year. She has been with her current sex partner x 4 months.    Past Medical History  Diagnosis Date  . Chlamydia   . Contraceptive education 11/07/2013   Past Surgical History  Procedure Laterality Date  . Dilation and curettage of uterus     Family History  Problem Relation Age of Onset  . Asthma Brother   . Diabetes Maternal Grandmother   . Hypertension Maternal Grandmother   . Diabetes Maternal Grandfather   . Diabetes Paternal Grandmother   . Asthma Brother   . Asthma Brother   . Asthma Brother    History  Substance Use Topics  . Smoking status: Former Smoker    Types: Cigars  . Smokeless tobacco: Never Used  . Alcohol Use: No   OB History    Gravida Para Term Preterm AB TAB SAB Ectopic Multiple Living   1 0        0     Review of Systems Negative except as stated in HPI   Allergies  Review of patient's allergies indicates no known allergies.  Home Medications   Prior to Admission medications    Medication Sig Start Date End Date Taking? Authorizing Provider  cephALEXin (KEFLEX) 500 MG capsule Take 1 capsule (500 mg total) by mouth 4 (four) times daily. For 7 days Patient not taking: Reported on 04/22/2014 02/02/14   Tammy L. Triplett, PA-C  metroNIDAZOLE (FLAGYL) 500 MG tablet Take 1 tablet (500 mg total) by mouth 2 (two) times daily. Patient not taking: Reported on 04/22/2014 02/15/14   Janne NapoleonHope M Crystalynn Mcinerney, NP  nystatin-triamcinolone [facility](MYCOLOG II) cream Apply to affected area daily Patient not taking: Reported on 04/22/2014 02/15/14   Janne NapoleonHope M Daymen Hassebrock, NP  ondansetron (ZOFRAN) 4 MG tablet Take 1 tablet (4 mg total) by mouth every 6 (six) hours as needed for nausea or vomiting. Patient not taking: Reported on 04/22/2014 02/06/14   Kristen N Ward, DO   BP 128/78 mmHg  Pulse 108  Temp(Src) 98.9 F (37.2 C) (Oral)  Resp 16  Ht 5\' 8"  (1.727 m)  Wt 252 lb (114.306 kg)  BMI 38.33 kg/m2  SpO2 100%  LMP 11/20/2013 Physical Exam  Constitutional: She is oriented to person, place, and time. She appears well-developed and well-nourished.  HENT:  Head: Normocephalic.  Eyes: EOM are normal.  Neck: Neck supple.  Cardiovascular: Tachycardia present.   Pulmonary/Chest: Effort normal.  Abdominal: Soft. There is no tenderness.  Genitourinary:  External genitalia without lesions, white discharge vaginal vault.  No CMT, no adnexal tenderness, uterus without palpable enlargement.   Musculoskeletal: Normal range of motion.  Neurological: She is alert and oriented to person, place, and time. No cranial nerve deficit.  Skin: Skin is warm and dry.  Psychiatric: She has a normal mood and affect. Her behavior is normal.  Nursing note and vitals reviewed.   ED Course  Procedures (including critical care time) Labs Review Results for orders placed or performed during the hospital encounter of 04/22/14 (from the past 24 hour(s))  Urinalysis, Routine w reflex microscopic     Status: Abnormal   Collection Time:  04/22/14 12:20 PM  Result Value Ref Range   Color, Urine YELLOW YELLOW   APPearance HAZY (A) CLEAR   Specific Gravity, Urine 1.020 1.005 - 1.030   pH 6.0 5.0 - 8.0   Glucose, UA NEGATIVE NEGATIVE mg/dL   Hgb urine dipstick NEGATIVE NEGATIVE   Bilirubin Urine NEGATIVE NEGATIVE   Ketones, ur NEGATIVE NEGATIVE mg/dL   Protein, ur NEGATIVE NEGATIVE mg/dL   Urobilinogen, UA 0.2 0.0 - 1.0 mg/dL   Nitrite NEGATIVE NEGATIVE   Leukocytes, UA NEGATIVE NEGATIVE  CBC with Differential     Status: None   Collection Time: 04/22/14 12:42 PM  Result Value Ref Range   WBC 7.5 4.0 - 10.5 K/uL   RBC 4.71 3.87 - 5.11 MIL/uL   Hemoglobin 12.7 12.0 - 15.0 g/dL   HCT 19.138.4 47.836.0 - 29.546.0 %   MCV 81.5 78.0 - 100.0 fL   MCH 27.0 26.0 - 34.0 pg   MCHC 33.1 30.0 - 36.0 g/dL   RDW 62.114.8 30.811.5 - 65.715.5 %   Platelets 273 150 - 400 K/uL   Neutrophils Relative % 73 43 - 77 %   Neutro Abs 5.5 1.7 - 7.7 K/uL   Lymphocytes Relative 24 12 - 46 %   Lymphs Abs 1.8 0.7 - 4.0 K/uL   Monocytes Relative 3 3 - 12 %   Monocytes Absolute 0.3 0.1 - 1.0 K/uL   Eosinophils Relative 0 0 - 5 %   Eosinophils Absolute 0.0 0.0 - 0.7 K/uL   Basophils Relative 0 0 - 1 %   Basophils Absolute 0.0 0.0 - 0.1 K/uL  hCG, quantitative, pregnancy     Status: None   Collection Time: 04/22/14 12:42 PM  Result Value Ref Range   hCG, Beta Chain, Quant, S <1 <5 mIU/mL  Wet prep, genital     Status: None   Collection Time: 04/22/14  2:29 PM  Result Value Ref Range   Yeast Wet Prep HPF POC NONE SEEN NONE SEEN   Trich, Wet Prep NONE SEEN NONE SEEN   Clue Cells Wet Prep HPF POC NONE SEEN NONE SEEN   WBC, Wet Prep HPF POC NONE SEEN NONE SEEN    MDM  18 y.o. female with amenorrhea. Stable for discharge without abdominal pain on palpation. Normal labs and Bhcg <1. Patient to keep her appointment with the health department for her pap and follow up. She will return here as needed for problems. Discussed with the patient and all questioned fully  answered. Ibuprofen as needed for pain.    [address]Hope BrownfieldsM Jaxon Flatt, NP 04/22/14 1456  Benny LennertJoseph L Zammit, MD 04/22/14 [phone]

## 2014-04-22 NOTE — Discharge Instructions (Signed)
Take ibuprofen as needed for cramping.   Secondary Amenorrhea  Secondary amenorrhea is the stopping of menstrual flow for 3-6 months in a female who has previously had periods. There are many possible causes. Most of these causes are not serious. Usually, treating the underlying problem causing the loss of menses will return your periods to normal. CAUSES  Some common and uncommon causes of not menstruating include:  Malnutrition.  Low blood sugar (hypoglycemia).  Polycystic ovary disease.  Stress or fear.  Breastfeeding.  Hormone imbalance.  Ovarian failure.  Medicines.  Extreme obesity.  Cystic fibrosis.  Low body weight or drastic weight reduction from any cause.  Early menopause.  Removal of ovaries or uterus.  Contraceptives.  Illness.  Long-term (chronic) illnesses.  Cushing syndrome.  Thyroid problems.  Birth control pills, patches, or vaginal rings for birth control. RISK FACTORS You may be at greater risk of secondary amenorrhea if:  You have a family history of this condition.  You have an eating disorder.  You do athletic training. DIAGNOSIS  A diagnosis is made by your health care provider taking a medical history and doing a physical exam. This will include a pelvic exam to check for problems with your reproductive organs. Pregnancy must be ruled out. Often, numerous blood tests are done to measure different hormones in the body. Urine testing may be done. Specialized exams (ultrasound, CT scan, MRI, or hysteroscopy) may have to be done as well as measuring the body mass index (BMI). TREATMENT  Treatment depends on the cause of the amenorrhea. If an eating disorder is present, this can be treated with an adequate diet and therapy. Chronic illnesses may improve with treatment of the illness. Amenorrhea may be corrected with medicines, lifestyle changes, or surgery. If the amenorrhea cannot be corrected, it is sometimes possible to create a false  menstruation with medicines. HOME CARE INSTRUCTIONS  Maintain a healthy diet.  Manage weight problems.  Exercise regularly but not excessively.  Get adequate sleep.  Manage stress.  Be aware of changes in your menstrual cycle. Keep a record of when your periods occur. Note the date your period starts, how long it lasts, and any problems. SEEK MEDICAL CARE IF: Your symptoms do not get better with treatment. Document Released: 07/07/2006 Document Revised: 01/26/2013 Document Reviewed: 11/11/2012 [facility] Patient Information 2015 [place], [place]. This information is not intended to replace advice given to you by your health care provider. Make sure you discuss any questions you have with your health care provider.

## 2014-04-22 NOTE — ED Notes (Signed)
Patient c/o of lower mid abdominal cramping last two days. Patient states she has not had a period in 5 months, took a home pregnancy test last month which was negative. No birthcontrol, admits to unprotected intercourse.

## 2014-04-24 LAB — GC/CHLAMYDIA PROBE AMP
CT Probe RNA: NEGATIVE
GC PROBE AMP APTIMA: NEGATIVE

## 2014-07-14 ENCOUNTER — Encounter: Payer: Self-pay | Admitting: Adult Health

## 2014-07-14 ENCOUNTER — Ambulatory Visit (INDEPENDENT_AMBULATORY_CARE_PROVIDER_SITE_OTHER): Payer: Medicaid Other | Admitting: Adult Health

## 2014-07-14 VITALS — BP 130/82 | Ht 68.5 in | Wt 239.0 lb

## 2014-07-14 DIAGNOSIS — R109 Unspecified abdominal pain: Secondary | ICD-10-CM

## 2014-07-14 DIAGNOSIS — N912 Amenorrhea, unspecified: Secondary | ICD-10-CM

## 2014-07-14 DIAGNOSIS — R102 Pelvic and perineal pain: Secondary | ICD-10-CM | POA: Insufficient documentation

## 2014-07-14 DIAGNOSIS — Z3202 Encounter for pregnancy test, result negative: Secondary | ICD-10-CM

## 2014-07-14 HISTORY — DX: Amenorrhea, unspecified: N91.2

## 2014-07-14 HISTORY — DX: Pelvic and perineal pain: R10.2

## 2014-07-14 HISTORY — DX: Unspecified abdominal pain: R10.9

## 2014-07-14 LAB — POCT URINE PREGNANCY: Preg Test, Ur: NEGATIVE

## 2014-07-14 NOTE — Progress Notes (Signed)
Subjective:     Patient ID: Courtney Zhang, female   DOB: 1996-03-02, 18 y.o.   MRN: [id number]  HPI Courtney Zhang is a 19 year old black female in complaining of no period since 11/26/13 and cramps and bloating like going to start and low pelvic pain at times, Has had some constipation at times.Sometimes breast sore too, like before a period.Had BM this am.  Review of Systems Patient denies any headaches, hearing loss, fatigue, blurred vision, shortness of breath, chest pain, problems with urination, or intercourse. No joint pain or mood swings.See HPI for positives.  Reviewed past medical,surgical, social and family history. Reviewed medications and allergies.     Objective:   Physical Exam BP 130/82 mmHg  Ht 5' 8.5" (1.74 m)  Wt 239 lb (108.41 kg)  BMI 35.81 kg/m2  LMP 11/26/2013   UPT negative,Skin warm and dry.Pelvic: external genitalia is normal in appearance no lesions, vagina: pink with good moisture and rugae,urethra has no lesions or masses noted, cervix:smooth with no CMT, uterus: normal size, shape and contour, mildly tender, no masses felt, adnexa: no masses,some tenderness noted. Bladder is non tender and no masses felt. Discussed will get US to assess ovaries and uterus, and if normal can give provera to start a period.Declines any pain meds,has used midol,helps some. GC/CHL obtained.   Assessment:     Amenorrhea  Abdominal cramps Pelvic pain    Plan:     Return in 1 week for gyn US  GC/CHL sent Review handout on pelvic pain and amenorrhea

## 2014-07-14 NOTE — Patient Instructions (Signed)
Secondary Amenorrhea  Secondary amenorrhea is the stopping of menstrual flow for 3-6 months in a female who has previously had periods. There are many possible causes. Most of these causes are not serious. Usually, treating the underlying problem causing the loss of menses will return your periods to normal. CAUSES  Some common and uncommon causes of not menstruating include:  Malnutrition.  Low blood sugar (hypoglycemia).  Polycystic ovary disease.  Stress or fear.  Breastfeeding.  Hormone imbalance.  Ovarian failure.  Medicines.  Extreme obesity.  Cystic fibrosis.  Low body weight or drastic weight reduction from any cause.  Early menopause.  Removal of ovaries or uterus.  Contraceptives.  Illness.  Long-term (chronic) illnesses.  Cushing syndrome.  Thyroid problems.  Birth control pills, patches, or vaginal rings for birth control. RISK FACTORS You may be at greater risk of secondary amenorrhea if:  You have a family history of this condition.  You have an eating disorder.  You do athletic training. DIAGNOSIS  A diagnosis is made by your health care provider taking a medical history and doing a physical exam. This will include a pelvic exam to check for problems with your reproductive organs. Pregnancy must be ruled out. Often, numerous blood tests are done to measure different hormones in the body. Urine testing may be done. Specialized exams (ultrasound, CT scan, MRI, or hysteroscopy) may have to be done as well as measuring the body mass index (BMI). TREATMENT  Treatment depends on the cause of the amenorrhea. If an eating disorder is present, this can be treated with an adequate diet and therapy. Chronic illnesses may improve with treatment of the illness. Amenorrhea may be corrected with medicines, lifestyle changes, or surgery. If the amenorrhea cannot be corrected, it is sometimes possible to create a false menstruation with medicines. HOME CARE  INSTRUCTIONS  Maintain a healthy diet.  Manage weight problems.  Exercise regularly but not excessively.  Get adequate sleep.  Manage stress.  Be aware of changes in your menstrual cycle. Keep a record of when your periods occur. Note the date your period starts, how long it lasts, and any problems. SEEK MEDICAL CARE IF: Your symptoms do not get better with treatment. Document Released: 07/07/2006 Document Revised: 01/26/2013 Document Reviewed: 11/11/2012 [facility] Patient Information 2015 [place], [place]. This information is not intended to replace advice given to you by your health care provider. Make sure you discuss any questions you have with your health care provider. Pelvic Pain Female pelvic pain can be caused by many different things and start from a variety of places. Pelvic pain refers to pain that is located in the lower half of the abdomen and between your hips. The pain may occur over a short period of time (acute) or may be reoccurring (chronic). The cause of pelvic pain may be related to disorders affecting the female reproductive organs (gynecologic), but it may also be related to the bladder, kidney stones, an intestinal complication, or muscle or skeletal problems. Getting help right away for pelvic pain is important, especially if there has been severe, sharp, or a sudden onset of unusual pain. It is also important to get help right away because some types of pelvic pain can be life threatening.  CAUSES  Below are only some of the causes of pelvic pain. The causes of pelvic pain can be in one of several categories.   Gynecologic.  Pelvic inflammatory disease.  Sexually transmitted infection.  Ovarian cyst or a twisted ovarian ligament (ovarian torsion).  Uterine lining that grows outside the uterus (endometriosis).  Fibroids, cysts, or tumors.  Ovulation.  Pregnancy.  Pregnancy that occurs outside the uterus (ectopic  pregnancy).  Miscarriage.  Labor.  Abruption of the placenta or ruptured uterus.  Infection.  Uterine infection (endometritis).  Bladder infection.  Diverticulitis.  Miscarriage related to a uterine infection (septic abortion).  Bladder.  Inflammation of the bladder (cystitis).  Kidney stone(s).  Gastrointestinal.  Constipation.  Diverticulitis.  Neurologic.  Trauma.  Feeling pelvic pain because of mental or emotional causes (psychosomatic).  Cancers of the bowel or pelvis. EVALUATION  Your caregiver will want to take a careful history of your concerns. This includes recent changes in your health, a careful gynecologic history of your periods (menses), and a sexual history. Obtaining your family history and medical history is also important. Your caregiver may suggest a pelvic exam. A pelvic exam will help identify the location and severity of the pain. It also helps in the evaluation of which organ system may be involved. In order to identify the cause of the pelvic pain and be properly treated, your caregiver may order tests. These tests may include:   A pregnancy test.  Pelvic ultrasonography.  An X-ray exam of the abdomen.  A urinalysis or evaluation of vaginal discharge.  Blood tests. HOME CARE INSTRUCTIONS   Only take over-the-counter or prescription medicines for pain, discomfort, or fever as directed by your caregiver.   Rest as directed by your caregiver.   Eat a balanced diet.   Drink enough fluids to make your urine clear or pale yellow, or as directed.   Avoid sexual intercourse if it causes pain.   Apply warm or cold compresses to the lower abdomen depending on which one helps the pain.   Avoid stressful situations.   Keep a journal of your pelvic pain. Write down when it started, where the pain is located, and if there are things that seem to be associated with the pain, such as food or your menstrual cycle.  Follow up with your  caregiver as directed.  SEEK MEDICAL CARE IF:  Your medicine does not help your pain.  You have abnormal vaginal discharge. SEEK IMMEDIATE MEDICAL CARE IF:   You have heavy bleeding from the vagina.   Your pelvic pain increases.   You feel light-headed or faint.   You have chills.   You have pain with urination or blood in your urine.   You have uncontrolled diarrhea or vomiting.   You have a fever or persistent symptoms for more than 3 days.  You have a fever and your symptoms suddenly get worse.   You are being physically or sexually abused.  MAKE SURE YOU:  Understand these instructions.  Will watch your condition.  Will get help if you are not doing well or get worse. Document Released: 04/22/2004 Document Revised: 10/10/2013 Document Reviewed: 09/15/2011 [facility] Patient Information 2015 [place], [place]. This information is not intended to replace advice given to you by your health care provider. Make sure you discuss any questions you have with your health care provider. Return for [place]

## 2014-07-18 LAB — GC/CHLAMYDIA PROBE AMP
CHLAMYDIA, DNA PROBE: NEGATIVE
Neisseria gonorrhoeae by PCR: NEGATIVE

## 2014-07-24 ENCOUNTER — Other Ambulatory Visit: Payer: Medicaid Other

## 2014-07-27 ENCOUNTER — Ambulatory Visit: Payer: Medicaid Other | Admitting: Adult Health

## 2014-07-31 ENCOUNTER — Ambulatory Visit (INDEPENDENT_AMBULATORY_CARE_PROVIDER_SITE_OTHER): Payer: Medicaid Other

## 2014-07-31 DIAGNOSIS — R102 Pelvic and perineal pain: Secondary | ICD-10-CM

## 2014-07-31 DIAGNOSIS — N912 Amenorrhea, unspecified: Secondary | ICD-10-CM

## 2014-07-31 DIAGNOSIS — R109 Unspecified abdominal pain: Secondary | ICD-10-CM

## 2014-08-02 ENCOUNTER — Ambulatory Visit (INDEPENDENT_AMBULATORY_CARE_PROVIDER_SITE_OTHER): Payer: Medicaid Other | Admitting: Adult Health

## 2014-08-02 ENCOUNTER — Encounter: Payer: Self-pay | Admitting: Adult Health

## 2014-08-02 VITALS — BP 138/82 | HR 92 | Ht 68.0 in | Wt 244.0 lb

## 2014-08-02 DIAGNOSIS — N912 Amenorrhea, unspecified: Secondary | ICD-10-CM

## 2014-08-02 DIAGNOSIS — N926 Irregular menstruation, unspecified: Secondary | ICD-10-CM

## 2014-08-02 DIAGNOSIS — Z3201 Encounter for pregnancy test, result positive: Secondary | ICD-10-CM | POA: Insufficient documentation

## 2014-08-02 DIAGNOSIS — E282 Polycystic ovarian syndrome: Secondary | ICD-10-CM

## 2014-08-02 HISTORY — DX: Polycystic ovarian syndrome: E28.2

## 2014-08-02 HISTORY — DX: Encounter for pregnancy test, result positive: Z32.01

## 2014-08-02 LAB — POCT URINE PREGNANCY: Preg Test, Ur: POSITIVE

## 2014-08-02 MED ORDER — PRENATAL PLUS 27-1 MG PO TABS: 1.0000 | ORAL_TABLET | Freq: Every day | ORAL | Status: DC

## 2014-08-02 NOTE — Patient Instructions (Signed)
Take prenatal vitamins Return in 2 days for stat blood work First Trimester of Pregnancy The first trimester of pregnancy is from week 1 until the end of week 12 (months 1 through 3). A week after a sperm fertilizes an egg, the egg will implant on the wall of the uterus. This embryo will begin to develop into a baby. Genes from you and your partner are forming the baby. The female genes determine whether the baby is a boy or a girl. At 6-8 weeks, the eyes and face are formed, and the heartbeat can be seen on ultrasound. At the end of 12 weeks, all the baby's organs are formed.  Now that you are pregnant, you will want to do everything you can to have a healthy baby. Two of the most important things are to get good prenatal care and to follow your health care provider's instructions. Prenatal care is all the medical care you receive before the baby's birth. This care will help prevent, find, and treat any problems during the pregnancy and childbirth. BODY CHANGES Your body goes through many changes during pregnancy. The changes vary from woman to woman.   You may gain or lose a couple of pounds at first.  You may feel sick to your stomach (nauseous) and throw up (vomit). If the vomiting is uncontrollable, call your health care provider.  You may tire easily.  You may develop headaches that can be relieved by medicines approved by your health care provider.  You may urinate more often. Painful urination may mean you have a bladder infection.  You may develop heartburn as a result of your pregnancy.  You may develop constipation because certain hormones are causing the muscles that push waste through your intestines to slow down.  You may develop hemorrhoids or swollen, bulging veins (varicose veins).  Your breasts may begin to grow larger and become tender. Your nipples may stick out more, and the tissue that surrounds them (areola) may become darker.  Your gums may bleed and may be sensitive  to brushing and flossing.  Dark spots or blotches (chloasma, mask of pregnancy) may develop on your face. This will likely fade after the baby is born.  Your menstrual periods will stop.  You may have a loss of appetite.  You may develop cravings for certain kinds of food.  You may have changes in your emotions from day to day, such as being excited to be pregnant or being concerned that something may go wrong with the pregnancy and baby.  You may have more vivid and strange dreams.  You may have changes in your hair. These can include thickening of your hair, rapid growth, and changes in texture. Some women also have hair loss during or after pregnancy, or hair that feels dry or thin. Your hair will most likely return to normal after your baby is born. WHAT TO EXPECT AT YOUR PRENATAL VISITS During a routine prenatal visit:  You will be weighed to make sure you and the baby are growing normally.  Your blood pressure will be taken.  Your abdomen will be measured to track your baby's growth.  The fetal heartbeat will be listened to starting around week 10 or 12 of your pregnancy.  Test results from any previous visits will be discussed. Your health care provider may ask you:  How you are feeling.  If you are feeling the baby move.  If you have had any abnormal symptoms, such as leaking fluid, bleeding, severe headaches,  or abdominal cramping.  If you have any questions. Other tests that may be performed during your first trimester include:  Blood tests to find your blood type and to check for the presence of any previous infections. They will also be used to check for low iron levels (anemia) and Rh antibodies. Later in the pregnancy, blood tests for diabetes will be done along with other tests if problems develop.  Urine tests to check for infections, diabetes, or protein in the urine.  An ultrasound to confirm the proper growth and development of the baby.  An  amniocentesis to check for possible genetic problems.  Fetal screens for spina bifida and Down syndrome.  You may need other tests to make sure you and the baby are doing well. HOME CARE INSTRUCTIONS  Medicines  Follow your health care provider's instructions regarding medicine use. Specific medicines may be either safe or unsafe to take during pregnancy.  Take your prenatal vitamins as directed.  If you develop constipation, try taking a stool softener if your health care provider approves. Diet  Eat regular, well-balanced meals. Choose a variety of foods, such as meat or vegetable-based protein, fish, milk and low-fat dairy products, vegetables, fruits, and whole grain breads and cereals. Your health care provider will help you determine the amount of weight gain that is right for you.  Avoid raw meat and uncooked cheese. These carry germs that can cause birth defects in the baby.  Eating four or five small meals rather than three large meals a day may help relieve nausea and vomiting. If you start to feel nauseous, eating a few soda crackers can be helpful. Drinking liquids between meals instead of during meals also seems to help nausea and vomiting.  If you develop constipation, eat more high-fiber foods, such as fresh vegetables or fruit and whole grains. Drink enough fluids to keep your urine clear or pale yellow. Activity and Exercise  Exercise only as directed by your health care provider. Exercising will help you:  Control your weight.  Stay in shape.  Be prepared for labor and delivery.  Experiencing pain or cramping in the lower abdomen or low back is a good sign that you should stop exercising. Check with your health care provider before continuing normal exercises.  Try to avoid standing for long periods of time. Move your legs often if you must stand in one place for a long time.  Avoid heavy lifting.  Wear low-heeled shoes, and practice good posture.  You may  continue to have sex unless your health care provider directs you otherwise. Relief of Pain or Discomfort  Wear a good support bra for breast tenderness.   Take warm sitz baths to soothe any pain or discomfort caused by hemorrhoids. Use hemorrhoid cream if your health care provider approves.   Rest with your legs elevated if you have leg cramps or low back pain.  If you develop varicose veins in your legs, wear support hose. Elevate your feet for 15 minutes, 3-4 times a day. Limit salt in your diet. Prenatal Care  Schedule your prenatal visits by the twelfth week of pregnancy. They are usually scheduled monthly at first, then more often in the last 2 months before delivery.  Write down your questions. Take them to your prenatal visits.  Keep all your prenatal visits as directed by your health care provider. Safety  Wear your seat belt at all times when driving.  Make a list of emergency phone numbers, including numbers for  family, friends, the hospital, and police and fire departments. General Tips  Ask your health care provider for a referral to a local prenatal education class. Begin classes no later than at the beginning of month 6 of your pregnancy.  Ask for help if you have counseling or nutritional needs during pregnancy. Your health care provider can offer advice or refer you to specialists for help with various needs.  Do not use hot tubs, steam rooms, or saunas.  Do not douche or use tampons or scented sanitary pads.  Do not cross your legs for long periods of time.  Avoid cat litter boxes and soil used by cats. These carry germs that can cause birth defects in the baby and possibly loss of the fetus by miscarriage or stillbirth.  Avoid all smoking, herbs, alcohol, and medicines not prescribed by your health care provider. Chemicals in these affect the formation and growth of the baby.  Schedule a dentist appointment. At home, brush your teeth with a soft toothbrush  and be gentle when you floss. SEEK MEDICAL CARE IF:   You have dizziness.  You have mild pelvic cramps, pelvic pressure, or nagging pain in the abdominal area.  You have persistent nausea, vomiting, or diarrhea.  You have a bad smelling vaginal discharge.  You have pain with urination.  You notice increased swelling in your face, hands, legs, or ankles. SEEK IMMEDIATE MEDICAL CARE IF:   You have a fever.  You are leaking fluid from your vagina.  You have spotting or bleeding from your vagina.  You have severe abdominal cramping or pain.  You have rapid weight gain or loss.  You vomit blood or material that looks like coffee grounds.  You are exposed to [place] measles and have never had them.  You are exposed to fifth disease or chickenpox.  You develop a severe headache.  You have shortness of breath.  You have any kind of trauma, such as from a fall or a car accident. Document Released: 05/20/2001 Document Revised: 10/10/2013 Document Reviewed: 04/05/2013 [facility] Patient Information 2015 [place], [place]. This information is not intended to replace advice given to you by your health care provider. Make sure you discuss any questions you have with your health care provider.

## 2014-08-02 NOTE — Progress Notes (Signed)
Subjective:     Patient ID: Courtney Zhang, female   DOB: Aug 07, 1995, 19 y.o.   MRN: [id number]  HPI Courtney LericheShataysha is 19 year old black female in for amenorrhea, no period since June 2015.She had US 07/31/14 that showed PCO bilaterally,was going to Rx provera to start period but when check UPT it POSITIVE.  Review of Systems + amenorrhea, +UPT, all other systems negative Reviewed past medical,surgical, social and family history. Reviewed medications and allergies.     Objective:   Physical Exam BP 138/82 mmHg  Pulse 92  Ht 5\' 8"  (1.727 m)  Wt 244 lb (110.678 kg)  BMI 37.11 kg/m2  LMP 11/26/2013 UPT+, reviewed US with pt.    Uterus 8.1 x 4.7 x 4.0 cm, anteverted   Endometrium 8.8 mm, symmetrical,   Right ovary 4.3 x 3.3 x 2.5 cm, PCO appearance  Left ovary 3.6 x 2.4 x 2.2 cm, PCO appearance  No free fluid or adnexal masses noted within the pelvis  Technician Comments:  Anteverted uterus, Endometrium-8.8 symmetrical, bilateral PCO appearance, no free fluid or adnexal masses noted Pt aware +UPT, was negative 2 weeks ago, [facility] Rx PNV [facility] and [facility] in 48 hours. Face time 15 minutes with pt discussing surprise +UPT and PCO and need for follow up labs, she is OK but surprised,as was I.   Assessment:     Amenorrhea PCO  +UPT    Plan:     Rx prenatal plus #30 take 1 daily with 11 refills Check QHCG to day and then [facility] in 2 days [facility]   Review handout on first trimester

## 2014-08-03 ENCOUNTER — Telehealth: Payer: Self-pay | Admitting: Adult Health

## 2014-08-03 LAB — HCG, QUANTITATIVE, PREGNANCY: HCG QUANT: 2279 m[IU]/mL

## 2014-08-03 NOTE — Telephone Encounter (Signed)
[facility] keep appt tomorrow for repeat

## 2014-08-04 ENCOUNTER — Telehealth: Payer: Self-pay | Admitting: Adult Health

## 2014-08-04 ENCOUNTER — Other Ambulatory Visit: Payer: Self-pay | Admitting: Adult Health

## 2014-08-04 ENCOUNTER — Telehealth: Payer: Self-pay | Admitting: *Deleted

## 2014-08-04 ENCOUNTER — Other Ambulatory Visit: Payer: Medicaid Other

## 2014-08-04 DIAGNOSIS — Z3201 Encounter for pregnancy test, result positive: Secondary | ICD-10-CM

## 2014-08-04 LAB — HCG, QUANTITATIVE, PREGNANCY: hCG Quant: 4048 m[IU]/mL

## 2014-08-04 NOTE — Telephone Encounter (Signed)
Pt aware QHCG 4048 which is doubling call Monday and get US in about 10 days

## 2014-08-04 NOTE — Telephone Encounter (Signed)
[facility], Cyril MourningJennifer Griffin, NP notified of results.

## 2014-08-11 ENCOUNTER — Other Ambulatory Visit: Payer: Self-pay | Admitting: Adult Health

## 2014-08-11 DIAGNOSIS — O3680X0 Pregnancy with inconclusive fetal viability, not applicable or unspecified: Secondary | ICD-10-CM

## 2014-08-14 ENCOUNTER — Ambulatory Visit (INDEPENDENT_AMBULATORY_CARE_PROVIDER_SITE_OTHER): Payer: Medicaid Other

## 2014-08-14 DIAGNOSIS — O3680X0 Pregnancy with inconclusive fetal viability, not applicable or unspecified: Secondary | ICD-10-CM

## 2014-08-14 NOTE — Progress Notes (Signed)
U/S-single IUP with +FCA noted, FHR-111 bpm, CRL c/w 6+2 wks EDD 04/07/2015, cx appears closed, bilateral adnexa appears WNL with C.L. noted on the Rt

## 2014-08-18 ENCOUNTER — Encounter (HOSPITAL_COMMUNITY): Payer: Self-pay | Admitting: *Deleted

## 2014-08-18 ENCOUNTER — Emergency Department (HOSPITAL_COMMUNITY)
Admission: EM | Admit: 2014-08-18 | Discharge: 2014-08-18 | Discharge status: Against Medical Advice | Payer: Medicaid Other | Attending: Emergency Medicine | Admitting: Emergency Medicine

## 2014-08-18 DIAGNOSIS — Z3A01 Less than 8 weeks gestation of pregnancy: Secondary | ICD-10-CM | POA: Diagnosis not present

## 2014-08-18 DIAGNOSIS — O219 Vomiting of pregnancy, unspecified: Secondary | ICD-10-CM | POA: Diagnosis not present

## 2014-08-18 LAB — CBC WITH DIFFERENTIAL/PLATELET
BASOS ABS: 0 10*3/uL (ref 0.0–0.1)
BASOS PCT: 0 % (ref 0–1)
Eosinophils Absolute: 0 10*3/uL (ref 0.0–0.7)
Eosinophils Relative: 0 % (ref 0–5)
HEMATOCRIT: 37.2 % (ref 36.0–46.0)
Hemoglobin: 12.3 g/dL (ref 12.0–15.0)
Lymphocytes Relative: 17 % (ref 12–46)
Lymphs Abs: 2 10*3/uL (ref 0.7–4.0)
MCH: 27 pg (ref 26.0–34.0)
MCHC: 33.1 g/dL (ref 30.0–36.0)
MCV: 81.6 fL (ref 78.0–100.0)
Monocytes Absolute: 0.4 10*3/uL (ref 0.1–1.0)
Monocytes Relative: 4 % (ref 3–12)
NEUTROS PCT: 79 % — AB (ref 43–77)
Neutro Abs: 9.2 10*3/uL — ABNORMAL HIGH (ref 1.7–7.7)
Platelets: 312 10*3/uL (ref 150–400)
RBC: 4.56 MIL/uL (ref 3.87–5.11)
RDW: 14.9 % (ref 11.5–15.5)
WBC: 11.6 10*3/uL — AB (ref 4.0–10.5)

## 2014-08-18 LAB — COMPREHENSIVE METABOLIC PANEL
ALK PHOS: 73 U/L (ref 39–117)
ALT: 20 U/L (ref 0–35)
AST: 20 U/L (ref 0–37)
Albumin: 4.5 g/dL (ref 3.5–5.2)
Anion gap: 8 (ref 5–15)
BILIRUBIN TOTAL: 0.6 mg/dL (ref 0.3–1.2)
BUN: 8 mg/dL (ref 6–23)
CALCIUM: 9.6 mg/dL (ref 8.4–10.5)
CO2: 25 mmol/L (ref 19–32)
CREATININE: 0.58 mg/dL (ref 0.50–1.10)
Chloride: 105 mmol/L (ref 96–112)
GFR calc non Af Amer: >90 mL/min (ref >90)
Glucose, Bld: 83 mg/dL (ref 70–99)
Potassium: 3.4 mmol/L — ABNORMAL LOW (ref 3.5–5.1)
SODIUM: 138 mmol/L (ref 135–145)
TOTAL PROTEIN: 8.3 g/dL (ref 6.0–8.3)

## 2014-08-18 NOTE — ED Notes (Signed)
Pt is [redacted] weeks pregnant. Pt states severe vomiting x 2 days, unable to keep anything down. First appt with OB is in 2 weeks.

## 2014-08-29 ENCOUNTER — Encounter: Payer: Self-pay | Admitting: Advanced Practice Midwife

## 2014-08-29 ENCOUNTER — Ambulatory Visit (INDEPENDENT_AMBULATORY_CARE_PROVIDER_SITE_OTHER): Payer: Medicaid Other | Admitting: Advanced Practice Midwife

## 2014-08-29 VITALS — BP 120/78 | HR 84 | Wt 238.0 lb

## 2014-08-29 DIAGNOSIS — Q8789 Other specified congenital malformation syndromes, not elsewhere classified: Secondary | ICD-10-CM

## 2014-08-29 DIAGNOSIS — Z1389 Encounter for screening for other disorder: Secondary | ICD-10-CM

## 2014-08-29 DIAGNOSIS — Z369 Encounter for antenatal screening, unspecified: Secondary | ICD-10-CM

## 2014-08-29 DIAGNOSIS — Z3490 Encounter for supervision of normal pregnancy, unspecified, unspecified trimester: Secondary | ICD-10-CM | POA: Insufficient documentation

## 2014-08-29 DIAGNOSIS — Z3682 Encounter for antenatal screening for nuchal translucency: Secondary | ICD-10-CM

## 2014-08-29 DIAGNOSIS — Z331 Pregnant state, incidental: Secondary | ICD-10-CM

## 2014-08-29 DIAGNOSIS — Z3491 Encounter for supervision of normal pregnancy, unspecified, first trimester: Secondary | ICD-10-CM

## 2014-08-29 LAB — POCT URINALYSIS DIPSTICK
Blood, UA: NEGATIVE
Glucose, UA: NEGATIVE
Ketones, UA: NEGATIVE
Leukocytes, UA: NEGATIVE
NITRITE UA: NEGATIVE
PROTEIN UA: NEGATIVE

## 2014-08-29 NOTE — Progress Notes (Signed)
Pt given [facility] form and lab consents to read over and sign. Pt states that she has been having really bad headaches.

## 2014-08-29 NOTE — Patient Instructions (Signed)
.Safe Medications in Pregnancy   Acne: Benzoyl Peroxide Salicylic Acid  Backache/Headache: Tylenol: 2 regular strength every 4 hours OR              2 Extra strength every 6 hours  Colds/Coughs/Allergies: Benadryl (alcohol free) 25 mg every 6 hours as needed Breath right strips Claritin Cepacol throat lozenges Chloraseptic throat spray Cold-Eeze- up to three times per day Cough drops, alcohol free Flonase (by prescription only) Guaifenesin Mucinex Robitussin DM (plain only, alcohol free) Saline nasal spray/drops Sudafed (pseudoephedrine) & Actifed ** use only after [redacted] weeks gestation and if you do not have high blood pressure Tylenol Vicks Vaporub Zinc lozenges Zyrtec   Constipation: Colace Ducolax suppositories Fleet enema Glycerin suppositories Metamucil Milk of magnesia Miralax Senokot Smooth move tea  Diarrhea: Kaopectate Imodium A-D  *NO pepto Bismol  Hemorrhoids: Anusol Anusol HC Preparation H Tucks  Indigestion: Tums Maalox Mylanta Zantac  Pepcid  Insomnia: Benadryl (alcohol free) 25mg every 6 hours as needed Tylenol PM Unisom, no Gelcaps  Leg Cramps: Tums MagGel  Nausea/Vomiting:  Bonine Dramamine Emetrol Ginger extract Sea bands Meclizine  Nausea medication to take during pregnancy:  Unisom (doxylamine succinate 25 mg tablets) Take one tablet daily at bedtime. If symptoms are not adequately controlled, the dose can be increased to a maximum recommended dose of two tablets daily (1/2 tablet in the morning, 1/2 tablet mid-afternoon and one at bedtime). Vitamin B6 100mg tablets. Take one tablet twice a day (up to 200 mg per day).  Skin Rashes: Aveeno products Benadryl cream or 25mg every 6 hours as needed Calamine Lotion 1% cortisone cream  Yeast infection: Gyne-lotrimin 7 Monistat 7   **If taking multiple medications, please check labels to avoid duplicating the same active ingredients **take medication as directed on  the label ** Do not exceed 4000 mg of tylenol in 24 hours **Do not take medications that contain aspirin or ibuprofen    How a Baby Grows During Pregnancy Pregnancy begins when the female's sperm enters the female's egg. This happens in the fallopian tube and is called fertilization. The fertilized egg is called an embryo until it reaches 9 weeks from the time of fertilization. From 9 weeks until birth it is called a fetus. The fertilized egg moves down the tube into the uterus and attaches to the inside lining of the uterus.  The pregnant woman is responsible for the growth of the embryo/fetus by supplying nourishment and oxygen through the blood stream and placenta to the developing fetus. The uterus becomes larger and pops out from the abdomen more and more as the fetus develops and grows. A normal pregnancy lasts 280 days, with a range of 259 to 294 days, or 40 weeks. The pregnancy is divided up into three trimesters:  First trimester - 0 to 13 weeks.  Second trimester - 14 to 27 weeks.  Third trimester - 28 to 40 weeks. The day your baby is supposed to be born is called estimated date of confinement (EDC) or estimated date of delivery (EDD). GROWTH OF THE BABY MONTH BY MONTH 1. First Month: The fertilized egg attaches to the inside of the uterus and certain cells will form the placenta and others will develop into the fetus. The arms, legs, brain, spinal cord, lungs, and heart begin to develop. At the end of the first month the heart begins to beat. The embryo weighs less than an ounce and is  inch long. 2. Second Month: The bones can be seen, the inner   ear, eye lids, hands and feet form and genitals develop. By the end of 8 weeks, all of the major organs are developing. The fetus now weighs less than an ounce and is one inch (2.54 cm) long. 3. Third Month: Teeth buds appear, all the internal organs are forming, bones and muscles begin to grow, the spine can flex and the skin is transparent.  Finger and toe nails begin to form, the hands develop faster than the feet and the arms are longer than the legs at this point. The fetus weighs a little more than an ounce (0.03 kg) and is 3 inches (8.89cm) long. 4. Fourth Month: The placenta is completely formed. The external sex organs, neck, outer ear, eyebrows, eyelids and fingernails are formed. The fetus can hear, swallow, flex its arms and legs and the kidney begins to produce urine. The skin is covered with a white waxy coating (vernix) and very thin hair (lanugo) is present. The fetus weighs 5 ounces (0.14kg) and is 6 to 7 inches (16.51cm) long. 5. Fifth Month: The fetus moves around more and can be felt for the first time (called quickening), sleeps and wakes up at times, may begin to suck its finger and the nails grow to the end of the fingers. The gallbladder is now functioning and helps to digest the nutrients, eggs are formed in the female and the testicles begin to drop down from the abdomen to the scrotum in the female. The fetus weighs  to 1 pound (0.45kg) and is 10 inches (25.4cm) long. 6. Sixth Month: The lungs are formed but the fetus does not breath yet. The eyes open, the brain develops more quickly at this time, one can detect finger and toe prints and thicker hair grows. The fetus weighs 1 to 1 pounds (0.68kg) and is 12 inches (30.48cm) long. 7. Seventh Month: The fetus can hear and respond to sounds, kicks and stretches and can sense changes in light. The fetus weighs 2 to 2 pounds (1.13kg) and is 14 inches (35.56cm) long. 8. Eight Month: All organs and body systems are fully developed and functioning. The bones get harder, taste buds develop and can taste sweet and sour flavors and the fetus may hiccup now. Different parts of the brain are developing and the skull remains soft for the brain to grow. The fetus weighs 5 pounds (2.27kg) and is 18 inches (45.75cm) long. 9. Ninth Month: The fetus gains about a half a pound a week, the  lungs are fully developed, patterns of sleep develop and the head moves down into the bottom of the uterus called vertex. If the buttocks moves into the bottom of the uterus, it is called a breech. The fetus weighs 6 to 9 pounds (2.72 to 4.08kg) and is 20 inches (50.8cm) long. You should be informed about your pregnancy, yourself and how the baby is developing as much as possible. Being informed helps you to enjoy this experience. It also gives you the sense to feel if something is not going right and when to ask questions. Talk to your caregiver when you have questions about your baby or your own body. Document Released: 11/12/2007 Document Revised: 08/18/2011 Document Reviewed: 11/12/2007 Exit[employer] Patient Information 2015 [employer]. This information is not intended to replace advice given to you by your health care provider. Make sure you discuss any questions you have with your health care provider.  

## 2014-08-29 NOTE — Progress Notes (Signed)
  Subjective:    Courtney Zhang is a G2P0010 6132w3d being seen today for her first obstetrical visit.  Her obstetrical history is significant for being her first pregnancy.  Pregnancy history fully reviewed.  Patient reports HA 2-3 times a week.  Has taken Ibuprofen a few times  Filed Vitals:   08/29/14 1034  BP: 120/78  Pulse: 84  Weight: 238 lb (107.956 kg)    HISTORY: OB History  Gravida Para Term Preterm AB SAB TAB Ectopic Multiple Living  2 0   1     0    # Outcome Date GA Lbr Len/2nd Weight Sex Delivery Anes PTL Lv  2 Current           1 AB         FD     Past Medical History  Diagnosis Date  . Chlamydia   . Contraceptive education 11/07/2013  . Abdominal cramping 07/14/2014  . Pelvic pain in female 07/14/2014  . Amenorrhea 07/14/2014  . PCO (polycystic ovaries) 08/02/2014  . Positive pregnancy test 08/02/2014   Past Surgical History  Procedure Laterality Date  . Dilation and curettage of uterus  12/13/2011?   Family History  Problem Relation Age of Onset  . Asthma Brother   . Diabetes Maternal Grandmother   . Hypertension Maternal Grandmother   . Diabetes Maternal Grandfather   . Diabetes Paternal Grandmother   . Asthma Brother   . Asthma Brother   . Asthma Brother      Exam                                           Skin: normal coloration and turgor, no rashes    Neurologic: oriented, normal, normal mood   Extremities: normal strength, tone, and muscle mass   HEENT PERRLA   Mouth/Teeth mucous membranes moist, normal dentition   Neck supple and no masses   Cardiovascular: regular rate and rhythm   Respiratory:  appears well, vitals normal, no respiratory distress, acyanotic   Abdomen: soft, non-tender;  FHR: 160 us          Assessment:    Pregnancy: G2P0010 Patient Active Problem List   Diagnosis Date Noted  . Supervision of normal pregnancy 08/29/2014  . PCO (polycystic ovaries) 08/02/2014  . Positive pregnancy test 08/02/2014  .  Abdominal cramping 07/14/2014  . Pelvic pain in female 07/14/2014  . Amenorrhea 07/14/2014  . Contraceptive education 11/07/2013        Plan:     Initial labs drawn. Continue prenatal vitamins  Tylenol only for HA if necessary.  Try ice/biofreeze/non-medicinal if possible for the next 4 weeks Problem list reviewed and updated  Reviewed n/v relief measures and warning s/s to report  Reviewed recommended weight gain based on pre-gravid BMI  Encouraged well-balanced diet Genetic Screening discussed Integrated Screen: requested.  Ultrasound discussed; fetal survey: requested.  Follow up in 4 weeks for NT/IT.  CRESENZO-DISHMAN,Neeka Urista 08/29/2014

## 2014-08-30 LAB — URINE CULTURE

## 2014-08-31 LAB — GC/CHLAMYDIA PROBE AMP
CHLAMYDIA, DNA PROBE: NEGATIVE
Neisseria gonorrhoeae by PCR: NEGATIVE

## 2014-09-04 LAB — URINALYSIS, ROUTINE W REFLEX MICROSCOPIC
Bilirubin, UA: NEGATIVE
GLUCOSE, UA: NEGATIVE
Ketones, UA: NEGATIVE
Leukocytes, UA: NEGATIVE
Nitrite, UA: NEGATIVE
RBC UA: NEGATIVE
Specific Gravity, UA: >=1.03 — AB (ref 1.005–1.030)
UUROB: 0.2 mg/dL (ref 0.2–1.0)
pH, UA: 6.5 (ref 5.0–7.5)

## 2014-09-04 LAB — CBC
HCT: 38 % (ref 34.0–46.6)
Hemoglobin: 12.3 g/dL (ref 11.1–15.9)
MCH: 26.7 pg (ref 26.6–33.0)
MCHC: 32.4 g/dL (ref 31.5–35.7)
MCV: 83 fL (ref 79–97)
Platelets: 334 10*3/uL (ref 150–379)
RBC: 4.6 x10E6/uL (ref 3.77–5.28)
RDW: 15 % (ref 12.3–15.4)
WBC: 8.1 10*3/uL (ref 3.4–10.8)

## 2014-09-04 LAB — PMP SCREEN PROFILE (10S), URINE
AMPHETAMINE SCRN UR: NEGATIVE ng/mL
Barbiturate Screen, Ur: NEGATIVE ng/mL
Benzodiazepine Screen, Urine: NEGATIVE ng/mL
CANNABINOIDS UR QL SCN: POSITIVE ng/mL
Cocaine(Metab.)Screen, Urine: NEGATIVE ng/mL
Creatinine(Crt), U: 238.6 mg/dL (ref 20.0–300.0)
METHADONE SCREEN, URINE: NEGATIVE ng/mL
OXYCODONE+OXYMORPHONE UR QL SCN: NEGATIVE ng/mL
Opiate Scrn, Ur: NEGATIVE ng/mL
PCP SCRN UR: NEGATIVE ng/mL
PH UR, DRUG SCRN: 6.3 (ref 4.5–8.9)
Propoxyphene, Screen: NEGATIVE ng/mL

## 2014-09-04 LAB — ABO/RH: Rh Factor: POSITIVE

## 2014-09-04 LAB — ANTIBODY SCREEN: ANTIBODY SCREEN: NEGATIVE

## 2014-09-04 LAB — RPR: RPR Ser Ql: NONREACTIVE

## 2014-09-04 LAB — HEPATITIS B SURFACE ANTIGEN: Hepatitis B Surface Ag: NEGATIVE

## 2014-09-04 LAB — CYSTIC FIBROSIS MUTATION 97: Interpretation: NOT DETECTED

## 2014-09-04 LAB — RUBELLA SCREEN: Rubella Antibodies, IGG: 0.98 index — ABNORMAL LOW (ref >0.99)

## 2014-09-04 LAB — HIV ANTIBODY (ROUTINE TESTING W REFLEX): HIV SCREEN 4TH GENERATION: NONREACTIVE

## 2014-09-04 LAB — SICKLE CELL SCREEN: Sickle Cell Screen: NEGATIVE

## 2014-09-08 ENCOUNTER — Telehealth: Payer: Self-pay | Admitting: *Deleted

## 2014-09-08 ENCOUNTER — Encounter: Payer: Self-pay | Admitting: Advanced Practice Midwife

## 2014-09-08 NOTE — Telephone Encounter (Signed)
Pt concerned suddenly stopped having symptoms of breast tenderness, nausea, headaches and doesn't know why. No c/o vaginal bleeding and cramping. Pt informed initial ultrasound on 08/13/2013 WNL. Pt scheduled for ultrasound for NTIT on 09/21/2014 and to see Drenda FreezeFran. Pt informed to call office back if any changes but reassured pt U/S from 08/14/2014 WNL. Pt verbalized understanding.

## 2014-09-13 ENCOUNTER — Telehealth: Payer: Self-pay | Admitting: Women's Health

## 2014-09-13 NOTE — Telephone Encounter (Signed)
Pt states she has a sty on her eye lid that is a little bothersome and she also has had a boil on her thigh X 3 days.  She states she has an appointment with us next Thursday 04/14 and wants to know if she needs to be sooner.  I advised her to use warm compresses on the boil tonight and see how it looks in the morning or if it going to drain on its own, advised her if it does not look any better or drains to call us back in the morning and we could make her an appointment to be seen, our front office staff was already gone for the day so I can not schedule her an appointment at this time.  I advised her that we could evaluate the stye at her next visit unless it starts bothering her more or if she comes in for the boil they could have a look at it then.  Pt verbalized understanding.

## 2014-09-21 ENCOUNTER — Ambulatory Visit (INDEPENDENT_AMBULATORY_CARE_PROVIDER_SITE_OTHER): Payer: Medicaid Other

## 2014-09-21 ENCOUNTER — Encounter: Payer: Self-pay | Admitting: Advanced Practice Midwife

## 2014-09-21 ENCOUNTER — Ambulatory Visit (INDEPENDENT_AMBULATORY_CARE_PROVIDER_SITE_OTHER): Payer: Medicaid Other | Admitting: Advanced Practice Midwife

## 2014-09-21 VITALS — BP 120/70 | HR 84 | Wt 238.0 lb

## 2014-09-21 DIAGNOSIS — Z3491 Encounter for supervision of normal pregnancy, unspecified, first trimester: Secondary | ICD-10-CM

## 2014-09-21 DIAGNOSIS — Z331 Pregnant state, incidental: Secondary | ICD-10-CM

## 2014-09-21 DIAGNOSIS — Z1389 Encounter for screening for other disorder: Secondary | ICD-10-CM

## 2014-09-21 DIAGNOSIS — Z3682 Encounter for antenatal screening for nuchal translucency: Secondary | ICD-10-CM

## 2014-09-21 DIAGNOSIS — Z369 Encounter for antenatal screening, unspecified: Secondary | ICD-10-CM

## 2014-09-21 DIAGNOSIS — Z36 Encounter for antenatal screening of mother: Secondary | ICD-10-CM

## 2014-09-21 LAB — POCT URINALYSIS DIPSTICK
Blood, UA: NEGATIVE
Glucose, UA: NEGATIVE
KETONES UA: NEGATIVE
Leukocytes, UA: NEGATIVE
Nitrite, UA: NEGATIVE
PROTEIN UA: NEGATIVE

## 2014-09-21 NOTE — Progress Notes (Signed)
NT/NB US completed today at 11+[redacted] weeks GA.  CRL measures 60.663mm which is consistent with dating.  NT measures 1.341mm and nasal bone is present.  FHR is 153 bpm.  Bilateral ovaries appear normal.

## 2014-09-21 NOTE — Progress Notes (Signed)
Pt states that she had a sty on her right eye that went down, and she has a cyst down there.

## 2014-09-21 NOTE — Progress Notes (Signed)
G2P0010 1384w5d Estimated Date of Delivery: 04/07/15  Last menstrual period 11/26/2013.   BP weight and urine results all reviewed and noted.  Please refer to the obstetrical flow sheet for the fundal height and fetal heart rate documentation: NT/IT today:  NT/NB US completed today at 11+[redacted] weeks GA. CRL measures 60.463mm which is consistent with dating. NT measures 1.171mm and nasal bone is present. FHR is 153 bpm. Bilateral ovaries appear normal  Patient denies any bleeding and no rupture of membranes symptoms or regular contractions. Patient is without complaints. All questions were answered.  Plan:  Continued routine obstetrical care,   Follow up in 4 weeks for OB appointment, 2nd IT

## 2014-09-23 LAB — MATERNAL SCREEN, INTEGRATED #1
Crown Rump Length: 60.3 mm
Gest. Age on Collection Date: 12.4 weeks
Maternal Age at EDD: 21.3 years
NUCHAL TRANSLUCENCY (NT): 1.1 mm
NUMBER OF FETUSES: 1
PAPP-A VALUE: 996.5 ng/mL
WEIGHT: 238 [lb_av]

## 2014-10-19 ENCOUNTER — Ambulatory Visit (INDEPENDENT_AMBULATORY_CARE_PROVIDER_SITE_OTHER): Payer: Medicaid Other | Admitting: Advanced Practice Midwife

## 2014-10-19 ENCOUNTER — Encounter: Payer: Self-pay | Admitting: Advanced Practice Midwife

## 2014-10-19 VITALS — BP 112/70 | HR 64 | Wt 238.0 lb

## 2014-10-19 DIAGNOSIS — Z331 Pregnant state, incidental: Secondary | ICD-10-CM

## 2014-10-19 DIAGNOSIS — Z3682 Encounter for antenatal screening for nuchal translucency: Secondary | ICD-10-CM

## 2014-10-19 DIAGNOSIS — Z363 Encounter for antenatal screening for malformations: Secondary | ICD-10-CM

## 2014-10-19 DIAGNOSIS — Z3492 Encounter for supervision of normal pregnancy, unspecified, second trimester: Secondary | ICD-10-CM

## 2014-10-19 DIAGNOSIS — Z1389 Encounter for screening for other disorder: Secondary | ICD-10-CM

## 2014-10-19 LAB — POCT URINALYSIS DIPSTICK
Blood, UA: NEGATIVE
GLUCOSE UA: NEGATIVE
Ketones, UA: NEGATIVE
Leukocytes, UA: NEGATIVE
NITRITE UA: NEGATIVE
Protein, UA: NEGATIVE

## 2014-10-19 NOTE — Progress Notes (Signed)
G2P0010 2615w5d Estimated Date of Delivery: 04/07/15  Blood pressure 112/70, pulse 64, weight 238 lb (107.956 kg), last menstrual period 11/26/2013.   BP weight and urine results all reviewed and noted.  Please refer to the obstetrical flow sheet for the fundal height and fetal heart rate documentation:  Patient reports denies any bleeding and no rupture of membranes symptoms or regular contractions. Patient is without complaints. All questions were answered.  Plan:  Continued routine obstetrical care, 2nd IT  Follow up in 4 weeks for OB appointment, anatomy scan

## 2014-10-21 LAB — MATERNAL SCREEN, INTEGRATED #2
AFP MARKER: 24.8 ng/mL
AFP MoM: 0.99
CROWN RUMP LENGTH: 60.3 mm
DIA MOM: 1.24
DIA VALUE: 172.5 pg/mL
Estriol, Unconjugated: 0.75 ng/mL
GESTATIONAL AGE: 16.4 wk
Gest. Age on Collection Date: 12.4 weeks
MATERNAL AGE AT EDD: 21.3 a
NUMBER OF FETUSES: 1
Nuchal Translucency (NT): 1.1 mm
Nuchal Translucency MoM: 0.84
PAPP-A MoM: 1.83
PAPP-A Value: 996.5 ng/mL
Test Results:: NEGATIVE
WEIGHT: 238 [lb_av]
Weight: 238 [lb_av]
hCG MoM: 1.3
hCG Value: 29.3 IU/mL
uE3 MoM: 0.94

## 2014-11-16 ENCOUNTER — Ambulatory Visit (INDEPENDENT_AMBULATORY_CARE_PROVIDER_SITE_OTHER): Payer: Medicaid Other | Admitting: Advanced Practice Midwife

## 2014-11-16 ENCOUNTER — Encounter: Payer: Self-pay | Admitting: Advanced Practice Midwife

## 2014-11-16 ENCOUNTER — Ambulatory Visit (INDEPENDENT_AMBULATORY_CARE_PROVIDER_SITE_OTHER): Payer: Medicaid Other

## 2014-11-16 VITALS — BP 110/70 | HR 72 | Wt 248.0 lb

## 2014-11-16 DIAGNOSIS — Z3492 Encounter for supervision of normal pregnancy, unspecified, second trimester: Secondary | ICD-10-CM

## 2014-11-16 DIAGNOSIS — Z36 Encounter for antenatal screening of mother: Secondary | ICD-10-CM | POA: Diagnosis not present

## 2014-11-16 DIAGNOSIS — Z363 Encounter for antenatal screening for malformations: Secondary | ICD-10-CM

## 2014-11-16 DIAGNOSIS — Z1389 Encounter for screening for other disorder: Secondary | ICD-10-CM

## 2014-11-16 DIAGNOSIS — Z331 Pregnant state, incidental: Secondary | ICD-10-CM

## 2014-11-16 LAB — POCT URINALYSIS DIPSTICK
Glucose, UA: NEGATIVE
KETONES UA: NEGATIVE
Leukocytes, UA: NEGATIVE
NITRITE UA: NEGATIVE
PROTEIN UA: NEGATIVE
RBC UA: NEGATIVE

## 2014-11-16 NOTE — Progress Notes (Addendum)
[place] 19+5WKS,measurements c/w dates,limited view of heart,nose and lip,efw 347g,fht134bpm,svp of fluid 4cm,cx 3.8cm (tv),cephalic,normal ov's bilat,please have pt come back for additional images,no obvious abn seen

## 2014-11-16 NOTE — Patient Instructions (Signed)
Call [phone] to sign up for waterbirth class

## 2014-11-16 NOTE — Progress Notes (Signed)
G2P0010 [redacted]w[redacted]d Estimated Date of Delivery: 04/07/15  Last menstrual period 11/26/2013.   BP weight and urine results all reviewed and noted.  Please refer to the obstetrical flow sheet for the fundal height and fetal heart rate documentation:[place] 19+5WKS,measurements c/w dates,limited view of heart,efw 347g,fht134bpm,svp of fluid 4cm,cx 3.8cm (tv),cephalic,normal ov's bilat,please have pt come back for additional images,no obvious abn seen  Patient reports good fetal movement, denies any bleeding and no rupture of membranes symptoms or regular contractions. Patient is without complaints. All questions were answered.  Plan:  Continued routine obstetrical care,   Follow up in 4 weeks for OB appointment,

## 2014-11-30 ENCOUNTER — Encounter: Payer: Self-pay | Admitting: Advanced Practice Midwife

## 2014-11-30 ENCOUNTER — Ambulatory Visit (INDEPENDENT_AMBULATORY_CARE_PROVIDER_SITE_OTHER): Payer: Medicaid Other | Admitting: Advanced Practice Midwife

## 2014-11-30 VITALS — BP 120/74 | HR 104 | Wt 253.0 lb

## 2014-11-30 DIAGNOSIS — Z331 Pregnant state, incidental: Secondary | ICD-10-CM

## 2014-11-30 DIAGNOSIS — Z1389 Encounter for screening for other disorder: Secondary | ICD-10-CM

## 2014-11-30 DIAGNOSIS — Z3492 Encounter for supervision of normal pregnancy, unspecified, second trimester: Secondary | ICD-10-CM

## 2014-11-30 LAB — POCT URINALYSIS DIPSTICK
Blood, UA: NEGATIVE
Glucose, UA: NEGATIVE
Ketones, UA: NEGATIVE
Leukocytes, UA: NEGATIVE
Nitrite, UA: NEGATIVE

## 2014-11-30 NOTE — Progress Notes (Signed)
G2P0010 [redacted]w[redacted]d Estimated Date of Delivery: 04/07/15  Blood pressure 120/74, pulse 104, weight 253 lb (114.76 kg), last menstrual period 11/26/2013.   I think I accidentally clicked "2 week return visit" instead of 4 weeks, so she is early for her routine visit.  Mom is scaring her regarding waterbirth, but she is still considering it.  She will let us know when she makes up her mind  BP weight and urine results all reviewed and noted.  Please refer to the obstetrical flow sheet for the fundal height and fetal heart rate documentation:  Patient reports good fetal movement, denies any bleeding and no rupture of membranes symptoms or regular contractions. Patient is without complaints. All questions were answered.  Plan:  Continued routine obstetrical care,   Follow up in 4 weeks for OB appointment,

## 2014-11-30 NOTE — Patient Instructions (Signed)
Second Trimester of Pregnancy The second trimester is from week 13 through week 28, months 4 through 6. The second trimester is often a time when you feel your best. Your body has also adjusted to being pregnant, and you begin to feel better physically. Usually, morning sickness has lessened or quit completely, you may have more energy, and you may have an increase in appetite. The second trimester is also a time when the fetus is growing rapidly. At the end of the sixth month, the fetus is about 9 inches long and weighs about 1 pounds. You will likely begin to feel the baby move (quickening) between 18 and 20 weeks of the pregnancy. BODY CHANGES Your body goes through many changes during pregnancy. The changes vary from woman to woman.   Your weight will continue to increase. You will notice your lower abdomen bulging out.  You may begin to get stretch marks on your hips, abdomen, and breasts.  You may develop headaches that can be relieved by medicines approved by your health care provider.  You may urinate more often because the fetus is pressing on your bladder.  You may develop or continue to have heartburn as a result of your pregnancy.  You may develop constipation because certain hormones are causing the muscles that push waste through your intestines to slow down.  You may develop hemorrhoids or swollen, bulging veins (varicose veins).  You may have back pain because of the weight gain and pregnancy hormones relaxing your joints between the bones in your pelvis and as a result of a shift in weight and the muscles that support your balance.  Your breasts will continue to grow and be tender.  Your gums may bleed and may be sensitive to brushing and flossing.  Dark spots or blotches (chloasma, mask of pregnancy) may develop on your face. This will likely fade after the baby is born.  A dark line from your belly button to the pubic area (linea nigra) may appear. This will likely fade  after the baby is born.  You may have changes in your hair. These can include thickening of your hair, rapid growth, and changes in texture. Some women also have hair loss during or after pregnancy, or hair that feels dry or thin. Your hair will most likely return to normal after your baby is born. WHAT TO EXPECT AT YOUR PRENATAL VISITS During a routine prenatal visit:  You will be weighed to make sure you and the fetus are growing normally.  Your blood pressure will be taken.  Your abdomen will be measured to track your baby's growth.  The fetal heartbeat will be listened to.  Any test results from the previous visit will be discussed. Your health care provider may ask you:  How you are feeling.  If you are feeling the baby move.  If you have had any abnormal symptoms, such as leaking fluid, bleeding, severe headaches, or abdominal cramping.  If you have any questions. Other tests that may be performed during your second trimester include:  Blood tests that check for:  Low iron levels (anemia).  Gestational diabetes (between 24 and 28 weeks).  Rh antibodies.  Urine tests to check for infections, diabetes, or protein in the urine.  An ultrasound to confirm the proper growth and development of the baby.  An amniocentesis to check for possible genetic problems.  Fetal screens for spina bifida and Down syndrome. HOME CARE INSTRUCTIONS   Avoid all smoking, herbs, alcohol, and unprescribed   drugs. These chemicals affect the formation and growth of the baby.  Follow your health care provider's instructions regarding medicine use. There are medicines that are either safe or unsafe to take during pregnancy.  Exercise only as directed by your health care provider. Experiencing uterine cramps is a good sign to stop exercising.  Continue to eat regular, healthy meals.  Wear a good support bra for breast tenderness.  Do not use hot tubs, steam rooms, or saunas.  Wear your  seat belt at all times when driving.  Avoid raw meat, uncooked cheese, cat litter boxes, and soil used by cats. These carry germs that can cause birth defects in the baby.  Take your prenatal vitamins.  Try taking a stool softener (if your health care provider approves) if you develop constipation. Eat more high-fiber foods, such as fresh vegetables or fruit and whole grains. Drink plenty of fluids to keep your urine clear or pale yellow.  Take warm sitz baths to soothe any pain or discomfort caused by hemorrhoids. Use hemorrhoid cream if your health care provider approves.  If you develop varicose veins, wear support hose. Elevate your feet for 15 minutes, 3-4 times a day. Limit salt in your diet.  Avoid heavy lifting, wear low heel shoes, and practice good posture.  Rest with your legs elevated if you have leg cramps or low back pain.  Visit your dentist if you have not gone yet during your pregnancy. Use a soft toothbrush to brush your teeth and be gentle when you floss.  A sexual relationship may be continued unless your health care provider directs you otherwise.  Continue to go to all your prenatal visits as directed by your health care provider. SEEK MEDICAL CARE IF:   You have dizziness.  You have mild pelvic cramps, pelvic pressure, or nagging pain in the abdominal area.  You have persistent nausea, vomiting, or diarrhea.  You have a bad smelling vaginal discharge.  You have pain with urination. SEEK IMMEDIATE MEDICAL CARE IF:   You have a fever.  You are leaking fluid from your vagina.  You have spotting or bleeding from your vagina.  You have severe abdominal cramping or pain.  You have rapid weight gain or loss.  You have shortness of breath with chest pain.  You notice sudden or extreme swelling of your face, hands, ankles, feet, or legs.  You have not felt your baby move in over an hour.  You have severe headaches that do not go away with  medicine.  You have vision changes. Document Released: 05/20/2001 Document Revised: 05/31/2013 Document Reviewed: 07/27/2012 [employer] Patient Information 2015 [employer]. This information is not intended to replace advice given to you by your health care provider. Make sure you discuss any questions you have with your health care provider.  

## 2014-11-30 NOTE — Progress Notes (Signed)
Pt denies any problems or concerns at this time.  

## 2014-12-21 ENCOUNTER — Encounter: Payer: Self-pay | Admitting: Advanced Practice Midwife

## 2014-12-21 ENCOUNTER — Ambulatory Visit (INDEPENDENT_AMBULATORY_CARE_PROVIDER_SITE_OTHER): Payer: Self-pay | Admitting: Advanced Practice Midwife

## 2014-12-21 VITALS — BP 110/72 | HR 108 | Wt 250.0 lb

## 2014-12-21 DIAGNOSIS — Z363 Encounter for antenatal screening for malformations: Secondary | ICD-10-CM

## 2014-12-21 DIAGNOSIS — Z331 Pregnant state, incidental: Secondary | ICD-10-CM

## 2014-12-21 DIAGNOSIS — Z36 Encounter for antenatal screening of mother: Secondary | ICD-10-CM

## 2014-12-21 DIAGNOSIS — Z1389 Encounter for screening for other disorder: Secondary | ICD-10-CM

## 2014-12-21 DIAGNOSIS — Z3492 Encounter for supervision of normal pregnancy, unspecified, second trimester: Secondary | ICD-10-CM

## 2014-12-21 LAB — POCT URINALYSIS DIPSTICK
Glucose, UA: NEGATIVE
KETONES UA: NEGATIVE
Leukocytes, UA: NEGATIVE
NITRITE UA: NEGATIVE
PROTEIN UA: NEGATIVE

## 2014-12-21 NOTE — Progress Notes (Signed)
G2P0010 5311w5d Estimated Date of Delivery: 04/07/15  Blood pressure 110/72, pulse 108, weight 250 lb (113.399 kg), last menstrual period 11/26/2013.   BP weight and urine results all reviewed and noted.  Please refer to the obstetrical flow sheet for the fundal height and fetal heart rate documentation:  Patient reports good fetal movement, denies any bleeding and no rupture of membranes symptoms or regular contractions. Patient is without complaints. All questions were answered.  Plan:  Continued routine obstetrical care,   Follow up in 3 weeks for OB appointment, PN2, US to recheck anantomy

## 2014-12-21 NOTE — Patient Instructions (Addendum)
1. Before your test, do not eat or drink anything for 8-10 hours prior to your  appointment (a small amount of water is allowed and you may take any medicines you normally take). Be sure to drink lots of water the day before. 2. When you arrive, your blood will be drawn for a 'fasting' blood sugar level.  Then you will be given a sweetened carbonated beverage to drink. You should  complete drinking this beverage within five minutes. After finishing the  beverage, you will have your blood drawn exactly 1 and 2 hours later. Having  your blood drawn on time is an important part of this test. A total of three blood  samples will be done. 3. The test takes approximately 2  hours. During the test, do not have anything to  eat or drink. Do not smoke, chew gum (not even sugarless gum) or use breath mints.  4. During the test you should remain close by and seated as much as possible and  avoid walking around. You may want to bring a book or something else to  occupy your time.  5. After your test, you may eat and drink as normal. You may want to bring a snack  to eat after the test is finished. Your provider will advise you as to the results of  this test and any follow-up if necessary  You will also be retested for syphilis, HIV and blood levels (anemia):  You were already tested in the first trimester, but [facility] recommends retesting.  Additionally, you will be tested for Type 2 Herpes. MOST people do not know that they have genital herpes, as only around 15% of people have outbreaks.  However, it is still transmittable to other people, including the baby (but only during the birth).  If you test positive for Type 2 Herpes, we place you on a medicine called acyclovir the last 6 weeks of your pregnancy to prevent transmission of the virus to the baby during the birth.    If your sugar test is positive for gestational diabetes, you will be given an phone call and further instructions discussed.   We typically do not call patients with positive herpes results, but will discuss it at your next appointment.  If you wish to know all of your test results before your next appointment, feel free to call the office, or look up your test results on Mychart.  (The range that the lab uses for normal values of the sugar test are not necessarily the range that is used for pregnant women; if your results are within the range, they are definitely normal.  However, if a value is deemed "high" by the lab, it may not be too high for a pregnant woman.  We will need to discuss the normal range if your value(s) fall in the "high" category).     Tdap Vaccine  It is recommended that you get the Tdap vaccine during the third trimester of EACH pregnancy to help protect your baby from getting pertussis (whooping cough)  27-36 weeks is the BEST time to do this so that you can pass the protection on to your baby. During pregnancy is better than after pregnancy, but if you are unable to get it during pregnancy it will be offered at the hospital.  You can get this vaccine at the health department or your family doctor, as well as some pharmacies.  Everyone who will be around your baby should also be up-to-date on   their vaccines. Adults (who are not pregnant) only need 1 dose of Tdap during adulthood.     Round Ligament Pain During Pregnancy Round ligament pain is a sharp pain or jabbing feeling often felt in the lower belly or groin area on one or both sides. It is one of the most common complaints during pregnancy and is considered a normal part of pregnancy. It is most often felt during the second trimester.  Here is what you need to know about round ligament pain, including some tips to help you feel better.  Causes of Round Ligament Pain  Several thick ligaments surround and support your womb (uterus) as it grows during pregnancy. One of them is called the round ligament.  The round ligament connects the front  part of the womb to your groin, the area where your legs attach to your pelvis. The round ligament normally tightens and relaxes slowly.  As your baby and womb grow, the round ligament stretches. That makes it more likely to become strained.  Sudden movements can cause the ligament to tighten quickly, like a rubber band snapping. This causes a sudden and quick jabbing feeling.  Symptoms of Round Ligament Pain  Round ligament pain can be concerning and uncomfortable. But it is considered normal as your body changes during pregnancy.  The symptoms of round ligament pain include a sharp, sudden spasm in the belly. It usually affects the right side, but it may happen on both sides. The pain only lasts a few seconds.  Exercise may cause the pain, as will rapid movements such as:  sneezing coughing laughing rolling over in bed standing up too quickly  Treatment of Round Ligament Pain  Here are some tips that may help reduce your discomfort:  Pain relief. Take over-the-counter acetaminophen for pain, if necessary. Ask your doctor if this is OK.  Exercise. Get plenty of exercise to keep your stomach (core) muscles strong. Doing stretching exercises or prenatal yoga can be helpful. Ask your doctor which exercises are safe for you and your baby.  A helpful exercise involves putting your hands and knees on the floor, lowering your head, and pushing your backside into the air.  Avoid sudden movements. Change positions slowly (such as standing up or sitting down) to avoid sudden movements that may cause stretching and pain.  Flex your hips. Bend and flex your hips before you cough, sneeze, or laugh to avoid pulling on the ligaments.  Apply warmth. A heating pad or warm bath may be helpful. Ask your doctor if this is OK. Extreme heat can be dangerous to the baby.  You should try to modify your daily activity level and avoid positions that may worsen the condition.  When to Call the  Doctor/Midwife  Always tell your doctor or midwife about any type of pain you have during pregnancy. Round ligament pain is quick and doesn't last long.  Call your health care provider immediately if you have:  severe pain fever chills pain on urination difficulty walking  Belly pain during pregnancy can be due to many different causes. It is important for your doctor to rule out more serious conditions, including pregnancy complications such as placenta abruption or non-pregnancy illnesses such as:  inguinal hernia appendicitis stomach, liver, and kidney problems Preterm labor pains may sometimes be mistaken for round ligament pain.  

## 2014-12-22 ENCOUNTER — Telehealth: Payer: Self-pay | Admitting: *Deleted

## 2014-12-22 NOTE — Telephone Encounter (Signed)
Pt has several questions in regards to dipstick results from 12/21/2014. All questions answered to patients satisfaction.

## 2015-01-11 ENCOUNTER — Telehealth: Payer: Self-pay | Admitting: Obstetrics & Gynecology

## 2015-01-11 NOTE — Telephone Encounter (Signed)
Pt c/o pain with intercourse and after intercourse, no vaginal bleeding, + FM. Pt informed can have discomfort during pregnancy with intercourse. Pt to discuss with provider at her appt tomorrow with Dr. Emelda Fear.

## 2015-01-12 ENCOUNTER — Ambulatory Visit (INDEPENDENT_AMBULATORY_CARE_PROVIDER_SITE_OTHER): Payer: Medicaid Other | Admitting: Obstetrics and Gynecology

## 2015-01-12 ENCOUNTER — Ambulatory Visit (INDEPENDENT_AMBULATORY_CARE_PROVIDER_SITE_OTHER): Payer: Medicaid Other

## 2015-01-12 ENCOUNTER — Other Ambulatory Visit: Payer: Medicaid Other

## 2015-01-12 VITALS — BP 108/58 | HR 96 | Wt 246.0 lb

## 2015-01-12 DIAGNOSIS — Z363 Encounter for antenatal screening for malformations: Secondary | ICD-10-CM

## 2015-01-12 DIAGNOSIS — Z36 Encounter for antenatal screening of mother: Secondary | ICD-10-CM | POA: Diagnosis not present

## 2015-01-12 DIAGNOSIS — Z331 Pregnant state, incidental: Secondary | ICD-10-CM

## 2015-01-12 DIAGNOSIS — Z369 Encounter for antenatal screening, unspecified: Secondary | ICD-10-CM

## 2015-01-12 DIAGNOSIS — Z131 Encounter for screening for diabetes mellitus: Secondary | ICD-10-CM

## 2015-01-12 DIAGNOSIS — Z3493 Encounter for supervision of normal pregnancy, unspecified, third trimester: Secondary | ICD-10-CM

## 2015-01-12 DIAGNOSIS — Z1389 Encounter for screening for other disorder: Secondary | ICD-10-CM

## 2015-01-12 DIAGNOSIS — Z3492 Encounter for supervision of normal pregnancy, unspecified, second trimester: Secondary | ICD-10-CM

## 2015-01-12 DIAGNOSIS — Z3A27 27 weeks gestation of pregnancy: Secondary | ICD-10-CM

## 2015-01-12 LAB — POCT URINALYSIS DIPSTICK
Glucose, UA: NEGATIVE
Leukocytes, UA: NEGATIVE
Nitrite, UA: NEGATIVE

## 2015-01-12 NOTE — Progress Notes (Signed)
Patient ID: Courtney Zhang, female   DOB: 09-16-1995, 19 y.o.   MRN: [id number]  G2P0010 [redacted]w[redacted]d Estimated Date of Delivery: 04/07/15  Blood pressure 108/58, pulse 96, weight 246 lb (111.585 kg), last menstrual period 11/26/2013.   refer to the ob flow sheet for FH and FHR, also BP, Wt, Urine results:notable for trace blood, trace protein, large ketones  Patient reports  + good fetal movement, denies any bleeding and no rupture of membranes symptoms or regular contractions. Patient complaints: patient complains of dyspareunia, pain near the vaginal entrance, during and after intercourse. Patient states she had a GTT this morning. She denies vaginal itching or vaginal discharge.   Questions were answered. Assessment: Pregnancy [redacted]w[redacted]d, LROB, dyspareunia,    Plan:  Continued routine obstetrical care,   Recommended Corn Husker's lotion as lubricant  Encouraged childbirth classes at [facility], especially for FOB  F/u in 4 weeks for pnx care   This chart was SCRIBED for Christin Bach, MD by Ronney Lion, ED Scribe. This patient was seen in room 1, and the patient's care was started at 11:26 AM.  I personally performed the services described in this documentation, which was SCRIBED in my presence. The recorded information has been reviewed and considered accurate. It has been edited as necessary during review. Tilda Burrow, MD

## 2015-01-12 NOTE — Progress Notes (Signed)
[place] 27+6wks,measurements c/w dates,ant pl gr 3, efw 1358g,normal ov's bilat,afi 14.7,fht 130bpm,cx 3.6cm,anatomy complete,no obvious abn seen

## 2015-01-14 LAB — GLUCOSE TOLERANCE, 2 HOURS W/ 1HR
GLUCOSE, FASTING: 75 mg/dL (ref 65–91)
Glucose, 1 hour: 85 mg/dL (ref 65–179)
Glucose, 2 hour: 102 mg/dL (ref 65–152)

## 2015-01-14 LAB — ANTIBODY SCREEN: Antibody Screen: NEGATIVE

## 2015-01-14 LAB — CBC
HEMATOCRIT: 35.9 % (ref 34.0–46.6)
HEMOGLOBIN: 11.5 g/dL (ref 11.1–15.9)
MCH: 26.4 pg — AB (ref 26.6–33.0)
MCHC: 32 g/dL (ref 31.5–35.7)
MCV: 82 fL (ref 79–97)
PLATELETS: 285 10*3/uL (ref 150–379)
RBC: 4.36 x10E6/uL (ref 3.77–5.28)
RDW: 14.7 % (ref 12.3–15.4)
WBC: 7.2 10*3/uL (ref 3.4–10.8)

## 2015-01-14 LAB — HSV 2 ANTIBODY, IGG

## 2015-01-14 LAB — RPR: RPR: NONREACTIVE

## 2015-01-14 LAB — HIV ANTIBODY (ROUTINE TESTING W REFLEX): HIV Screen 4th Generation wRfx: NONREACTIVE

## 2015-02-08 ENCOUNTER — Encounter: Payer: Medicaid Other | Admitting: Advanced Practice Midwife

## 2015-06-19 ENCOUNTER — Encounter (HOSPITAL_COMMUNITY): Payer: Self-pay | Admitting: *Deleted

## 2016-06-09 NOTE — L&D Delivery Note (Signed)
Delivery Note At  a viable female was delivered via  (Presentation LOA  ).  Loose nuchal cord x 2, somersaulted through.   APGAR: 8,9 ; weight 3000 grams   .   Placenta status: delivered with gentle traction.  Cord: 3 VC with the following complications: none.   Cord pH: NA  Anesthesia:  Epidural Episiotomy:  none Lacerations:  1st degree labial, hemostatic Suture Repair: NA Est. Blood Loss (mL):  150  Mom to postpartum.  Baby to Couplet care / Skin to Skin.  Charlesetta Garibaldi Itzel Mckibbin 03/21/2017, 8:54 PM

## 2016-07-22 LAB — OB RESULTS CONSOLE ABO/RH: RH Type: POSITIVE

## 2016-07-22 LAB — OB RESULTS CONSOLE GC/CHLAMYDIA
CHLAMYDIA, DNA PROBE: NEGATIVE
Gonorrhea: NEGATIVE

## 2016-07-22 LAB — OB RESULTS CONSOLE RUBELLA ANTIBODY, IGM: Rubella: IMMUNE

## 2016-07-22 LAB — OB RESULTS CONSOLE RPR: RPR: NONREACTIVE

## 2016-07-22 LAB — OB RESULTS CONSOLE ANTIBODY SCREEN: ANTIBODY SCREEN: NEGATIVE

## 2016-07-22 LAB — OB RESULTS CONSOLE HEPATITIS B SURFACE ANTIGEN: HEP B S AG: NEGATIVE

## 2016-07-22 LAB — OB RESULTS CONSOLE HIV ANTIBODY (ROUTINE TESTING): HIV: NONREACTIVE

## 2016-10-01 HISTORY — PX: CHOLECYSTECTOMY: SHX55

## 2017-02-10 ENCOUNTER — Encounter: Payer: Self-pay | Admitting: *Deleted

## 2017-02-17 ENCOUNTER — Ambulatory Visit: Payer: Medicaid Other | Admitting: *Deleted

## 2017-02-17 ENCOUNTER — Ambulatory Visit (INDEPENDENT_AMBULATORY_CARE_PROVIDER_SITE_OTHER): Payer: Medicaid Other | Admitting: Advanced Practice Midwife

## 2017-02-17 ENCOUNTER — Encounter: Payer: Self-pay | Admitting: Advanced Practice Midwife

## 2017-02-17 VITALS — BP 110/70 | HR 72 | Wt 219.0 lb

## 2017-02-17 DIAGNOSIS — Z3490 Encounter for supervision of normal pregnancy, unspecified, unspecified trimester: Secondary | ICD-10-CM | POA: Insufficient documentation

## 2017-02-17 DIAGNOSIS — Z3A34 34 weeks gestation of pregnancy: Secondary | ICD-10-CM

## 2017-02-17 DIAGNOSIS — Z3483 Encounter for supervision of other normal pregnancy, third trimester: Secondary | ICD-10-CM

## 2017-02-17 DIAGNOSIS — Z1389 Encounter for screening for other disorder: Secondary | ICD-10-CM

## 2017-02-17 DIAGNOSIS — Z331 Pregnant state, incidental: Secondary | ICD-10-CM

## 2017-02-17 LAB — POCT URINALYSIS DIPSTICK
Glucose, UA: NEGATIVE
KETONES UA: NEGATIVE
LEUKOCYTES UA: NEGATIVE
Nitrite, UA: NEGATIVE
Protein, UA: NEGATIVE
RBC UA: NEGATIVE

## 2017-02-17 NOTE — Progress Notes (Addendum)
  Subjective:    Courtney Zhang is a G3P1011 1042w5d being seen today for her first obstetrical visit.  Her obstetrical history is significant for CS for FTP at 8 cms. elective IOL.  Pregnancy history fully reviewed. She has gotten [place], moved back to town. Nothing significant this pregnanct.   Patient reports no complaints. [place]'t [place] it.    Vitals:   02/17/17 1348  BP: 110/70  Pulse: 72  Weight: 219 lb (99.3 kg)    HISTORY: OB History  Gravida Para Term Preterm AB Living  3 1 1   1 1   SAB TAB Ectopic Multiple Live Births  1       1    # Outcome Date GA Lbr Len/2nd Weight Sex Delivery Anes PTL Lv  3 Current           2 Term 03/31/15 3082w0d  7 lb 7.2 oz (3.38 kg) M CS-LTranv EPI N LIV     Complications: Failure to Progress in First Stage     Birth Comments: GBS+  1 SAB 2013 2794w0d       FD     Past Medical History:  Diagnosis Date  . Abdominal cramping 07/14/2014  . Amenorrhea 07/14/2014  . Chlamydia   . Contraceptive education 11/07/2013  . GERD (gastroesophageal reflux disease)   . PCO (polycystic ovaries) 08/02/2014  . Pelvic pain in female 07/14/2014  . Positive pregnancy test 08/02/2014   Past Surgical History:  Procedure Laterality Date  . CESAREAN SECTION    . CHOLECYSTECTOMY  10/01/2016  . DILATION AND CURETTAGE OF UTERUS  12/13/2011?   Family History  Problem Relation Age of Onset  . Asthma Brother   . Diabetes Maternal Grandmother   . Hypertension Maternal Grandmother   . Diabetes Maternal Grandfather   . Diabetes Paternal Grandmother   . Asthma Brother   . Asthma Brother   . Asthma Brother      Exam                                      System:     Skin: normal coloration and turgor, no rashes    Neurologic: oriented, normal, normal mood   Extremities: normal strength, tone, and muscle mass   HEENT PERRLA   Mouth/Teeth mucous membranes moist, normal dentition   Neck supple and no masses   Cardiovascular: regular rate and rhythm   Respiratory:  appears well, vitals normal, no respiratory distress, acyanotic   Abdomen: soft, non-tender;  FHR: 150          Assessment:    Pregnancy: G3P1011 Patient Active Problem List   Diagnosis Date Noted  . Supervision of normal pregnancy 02/17/2017  . PCO (polycystic ovaries) 08/02/2014  . Pelvic pain in female 07/14/2014  . Amenorrhea 07/14/2014        Plan:     Continue prenatal vitamins  Rx macrobid Problem list reviewed and updated  Encouraged well-balanced diet .  Return in about 1 week (around 02/24/2017) for LROB wLHE.  CRESENZO-DISHMAN,Tyee Vandevoorde 02/18/2017

## 2017-02-17 NOTE — Patient Instructions (Signed)

## 2017-02-19 ENCOUNTER — Encounter: Payer: Self-pay | Admitting: Advanced Practice Midwife

## 2017-02-19 MED ORDER — NITROFURANTOIN MONOHYD MACRO 100 MG PO CAPS: 100.0000 mg | ORAL_CAPSULE | Freq: Two times a day (BID) | ORAL | 0 refills | Status: DC

## 2017-02-19 NOTE — Addendum Note (Signed)
Addended by: Jacklyn ShellRESENZO-DISHMON, Delainee Tramel on: 02/19/2017 09:15 AM   Modules accepted: Orders

## 2017-02-23 ENCOUNTER — Emergency Department (HOSPITAL_COMMUNITY)
Admission: EM | Admit: 2017-02-23 | Discharge: 2017-02-23 | Disposition: A | Discharge status: Home / Self Care | Payer: Medicaid Other | Attending: Emergency Medicine | Admitting: Emergency Medicine

## 2017-02-23 ENCOUNTER — Encounter (HOSPITAL_COMMUNITY): Payer: Self-pay | Admitting: Emergency Medicine

## 2017-02-23 DIAGNOSIS — R112 Nausea with vomiting, unspecified: Secondary | ICD-10-CM | POA: Diagnosis not present

## 2017-02-23 DIAGNOSIS — Z79899 Other long term (current) drug therapy: Secondary | ICD-10-CM | POA: Diagnosis not present

## 2017-02-23 DIAGNOSIS — R1084 Generalized abdominal pain: Secondary | ICD-10-CM | POA: Insufficient documentation

## 2017-02-23 DIAGNOSIS — Z87891 Personal history of nicotine dependence: Secondary | ICD-10-CM | POA: Diagnosis not present

## 2017-02-23 DIAGNOSIS — R109 Unspecified abdominal pain: Secondary | ICD-10-CM

## 2017-02-23 LAB — CBC
HCT: 33.8 % — ABNORMAL LOW (ref 36.0–46.0)
Hemoglobin: 11.3 g/dL — ABNORMAL LOW (ref 12.0–15.0)
MCH: 28 pg (ref 26.0–34.0)
MCHC: 33.4 g/dL (ref 30.0–36.0)
MCV: 83.7 fL (ref 78.0–100.0)
PLATELETS: 205 10*3/uL (ref 150–400)
RBC: 4.04 MIL/uL (ref 3.87–5.11)
RDW: 13.9 % (ref 11.5–15.5)
WBC: 9 10*3/uL (ref 4.0–10.5)

## 2017-02-23 LAB — COMPREHENSIVE METABOLIC PANEL
ALK PHOS: 134 U/L — AB (ref 38–126)
ALT: 9 U/L — AB (ref 14–54)
AST: 14 U/L — ABNORMAL LOW (ref 15–41)
Albumin: 3.2 g/dL — ABNORMAL LOW (ref 3.5–5.0)
Anion gap: 11 (ref 5–15)
BILIRUBIN TOTAL: 0.5 mg/dL (ref 0.3–1.2)
BUN: 5 mg/dL — ABNORMAL LOW (ref 6–20)
CALCIUM: 8.7 mg/dL — AB (ref 8.9–10.3)
CO2: 20 mmol/L — AB (ref 22–32)
Chloride: 103 mmol/L (ref 101–111)
Creatinine, Ser: 0.36 mg/dL — ABNORMAL LOW (ref 0.44–1.00)
GFR calc non Af Amer: >60 mL/min (ref >60)
Glucose, Bld: 82 mg/dL (ref 65–99)
Potassium: 3.6 mmol/L (ref 3.5–5.1)
SODIUM: 134 mmol/L — AB (ref 135–145)
Total Protein: 7 g/dL (ref 6.5–8.1)

## 2017-02-23 LAB — URINALYSIS, ROUTINE W REFLEX MICROSCOPIC
BILIRUBIN URINE: NEGATIVE
Glucose, UA: NEGATIVE mg/dL
HGB URINE DIPSTICK: NEGATIVE
Ketones, ur: 80 mg/dL — AB
Leukocytes, UA: NEGATIVE
Nitrite: NEGATIVE
PROTEIN: NEGATIVE mg/dL
Specific Gravity, Urine: 1.019 (ref 1.005–1.030)
pH: 6 (ref 5.0–8.0)

## 2017-02-23 LAB — PREGNANCY, URINE: Preg Test, Ur: POSITIVE — AB

## 2017-02-23 LAB — LIPASE, BLOOD: Lipase: 28 U/L (ref 11–51)

## 2017-02-23 MED ORDER — FAMOTIDINE 20 MG PO TABS: 20.0000 mg | ORAL_TABLET | Freq: Two times a day (BID) | ORAL | 0 refills | Status: DC

## 2017-02-23 MED ORDER — METOCLOPRAMIDE HCL 10 MG PO TABS: 10.0000 mg | ORAL_TABLET | Freq: Four times a day (QID) | ORAL | 0 refills | Status: DC | PRN

## 2017-02-23 MED ORDER — DICYCLOMINE HCL 10 MG PO CAPS
10.0000 mg | ORAL_CAPSULE | Freq: Once | ORAL | Status: AC
  Administered 2017-02-23: 10 mg via ORAL
  Filled 2017-02-23: qty 1

## 2017-02-23 MED ORDER — DICYCLOMINE HCL 20 MG PO TABS: 20.0000 mg | ORAL_TABLET | Freq: Two times a day (BID) | ORAL | 0 refills | Status: DC | PRN

## 2017-02-23 MED ORDER — SODIUM CHLORIDE 0.9 % IV BOLUS (SEPSIS)
1000.0000 mL | Freq: Once | INTRAVENOUS | Status: AC
  Administered 2017-02-23: 1000 mL via INTRAVENOUS

## 2017-02-23 NOTE — ED Notes (Signed)
[facility] Shawnie Pons has cleared patient from [facility] hospital.

## 2017-02-23 NOTE — Progress Notes (Addendum)
3086 Received call about this 21 yo G3P1 @ 35.[redacted] wks GA in with complaints of abdominal pain and vomiting X 3 days. Reports vaginal discharge but denies vaginal bleeding or LOF. 0851 FHR Category I, no UC's tracing.  Dr. Shawnie Pons notified of pt in ED and of above.  She states the pt can be OB Cleared.  [facility] ED RN Lauren notified.

## 2017-02-23 NOTE — ED Triage Notes (Signed)
Patient [redacted] weeks pregnant 3r pregnancy; due date October 18th. Patient states abdominal pain x 3 days. States nausea with 1 occurrence of vomiting. Denies diarrhea.  Patient states pain spikes every 10 minutes.

## 2017-02-23 NOTE — ED Provider Notes (Signed)
[facility]-EMER[facility]ENCY DEPT Provider Note   CSN: [id number] Arrival date & time: 02/23/17  0745     History   Chief Complaint Chief Complaint  Patient presents with  . Abdominal Pain    HPI Courtney Zhang is a 21 y.o. female.  HPI G3 P1 at 35 weeks presents with 3 days episodic abdominal cramping. Associated nausea and several bouts of emesis. Denies diarrhea or constipation. Denies vaginal bleeding or loss of fluids. Still having active fetal movements.denies dysuria, frequency or urgency. No fever or chills. Currently denies any abdominal pain. Prenatal care is up-to-date. Past Medical History:  Diagnosis Date  . Abdominal cramping 07/14/2014  . Amenorrhea 07/14/2014  . Chlamydia   . Contraceptive education 11/07/2013  . GERD (gastroesophageal reflux disease)   . PCO (polycystic ovaries) 08/02/2014  . Pelvic pain in female 07/14/2014  . Positive pregnancy test 08/02/2014    Patient Active Problem List   Diagnosis Date Noted  . Supervision of normal pregnancy 02/17/2017  . PCO (polycystic ovaries) 08/02/2014  . Pelvic pain in female 07/14/2014  . Amenorrhea 07/14/2014    Past Surgical History:  Procedure Laterality Date  . CESAREAN SECTION    . CHOLECYSTECTOMY  10/01/2016  . DILATION AND CURETTAGE OF UTERUS  12/13/2011?    OB History    Gravida Para Term Preterm AB Living   SAB TAB Ectopic Multiple Live Births   1       1       Home Medications    Prior to Admission medications   Medication Sig Start Date End Date Taking? Authorizing Provider  prenatal vitamin w/FE, FA (PRENATAL 1 + 1) 27-1 MG TABS tablet Take 1 tablet by mouth daily at 12 noon. 08/02/14  Yes Adline Potter, NP  dicyclomine (BENTYL) 20 MG tablet Take 1 tablet (20 mg total) by mouth 2 (two) times daily as needed for spasms. 02/23/17   Loren Racer, MD  famotidine (PEPCID) 20 MG tablet Take 1 tablet (20 mg total) by mouth 2 (two) times daily. 02/23/17   Loren Racer, MD    metoCLOPramide (REGLAN) 10 MG tablet Take 1 tablet (10 mg total) by mouth every 6 (six) hours as needed for nausea or vomiting. 02/23/17   Loren Racer, MD  nitrofurantoin, macrocrystal-monohydrate, (MACROBID) 100 MG capsule Take 1 capsule (100 mg total) by mouth 2 (two) times daily. Patient not taking: Reported on 02/23/2017 02/19/17   Jacklyn Shell, CNM    Family History Family History  Problem Relation Age of Onset  . Asthma Brother   . Diabetes Maternal Grandmother   . Hypertension Maternal Grandmother   . Diabetes Maternal Grandfather   . Diabetes Paternal Grandmother   . Asthma Brother   . Asthma Brother   . Asthma Brother     Social History Social History  Substance Use Topics  . Smoking status: Former Smoker    Types: Cigars, Cigarettes  . Smokeless tobacco: Never Used  . Alcohol use No     Allergies   Patient has no known allergies.   Review of Systems Review of Systems  Constitutional: Negative for chills and fever.  Respiratory: Negative for cough and shortness of breath.   Cardiovascular: Negative for chest pain, palpitations and leg swelling.  Gastrointestinal: Positive for abdominal pain, nausea and vomiting. Negative for constipation and diarrhea.  Genitourinary: Negative for dysuria, flank pain, frequency, hematuria, pelvic pain, vaginal bleeding and vaginal discharge.  Musculoskeletal: Negative for back pain,  myalgias, neck pain and neck stiffness.  Skin: Negative for rash and wound.  Neurological: Negative for dizziness, weakness, light-headedness, numbness and headaches.  All other systems reviewed and are negative.    Physical Exam Updated Vital Signs BP 107/76   Pulse 96   Temp 98.6 F (37 C) (Oral)   Resp 16   Ht  (1.727 m)   Wt 99.3 kg (219 lb)   LMP 06/19/2016   SpO2 100%   BMI 33.30 kg/m   Physical Exam  Constitutional: She is oriented to person, place, and time. She appears well-developed and well-nourished. No  distress.  HENT:  Head: Normocephalic and atraumatic.  Mouth/Throat: Oropharynx is clear and moist.  Eyes: Pupils are equal, round, and reactive to light. EOM are normal.  Neck: Normal range of motion. Neck supple.  Cardiovascular: Normal rate and regular rhythm.  Exam reveals no gallop and no friction rub.   No murmur heard. Pulmonary/Chest: Effort normal and breath sounds normal. No respiratory distress. She has no wheezes. She has no rales. She exhibits no tenderness.  Abdominal: Soft. Bowel sounds are normal. There is no tenderness. There is no rebound and no guarding.  Gravid nontender abdomen  Musculoskeletal: Normal range of motion. She exhibits no edema or tenderness.  No CVA tenderness bilaterally. No lower extremity swelling, asymmetry or tenderness.  Neurological: She is alert and oriented to person, place, and time.  Moving all extremities without focal deficit. Sensation intact.  Skin: Skin is warm and dry. No rash noted. She is not diaphoretic. No erythema.  Psychiatric: She has a normal mood and affect. Her behavior is normal.  Nursing note and vitals reviewed.    ED Treatments / Results  Labs (all labs ordered are listed, but only abnormal results are displayed) Labs Reviewed  COMPREHENSIVE METABOLIC PANEL - Abnormal; Notable for the following:       Result Value   Sodium 134 (*)    CO2 20 (*)    BUN 5 (*)    Creatinine, Ser 0.36 (*)    Calcium 8.7 (*)    Albumin 3.2 (*)    AST 14 (*)    ALT 9 (*)    Alkaline Phosphatase 134 (*)    All other components within normal limits  CBC - Abnormal; Notable for the following:    Hemoglobin 11.3 (*)    HCT 33.8 (*)    All other components within normal limits  URINALYSIS, ROUTINE W REFLEX MICROSCOPIC - Abnormal; Notable for the following:    APPearance HAZY (*)    Ketones, ur 80 (*)    All other components within normal limits  PREGNANCY, URINE - Abnormal; Notable for the following:    Preg Test, Ur POSITIVE (*)     All other components within normal limits  LIPASE, BLOOD    EKG  EKG Interpretation None       Radiology No results found.  Procedures Procedures (including critical care time)  Medications Ordered in ED Medications  sodium chloride 0.9 % bolus 1,000 mL (0 mLs Intravenous Stopped 02/23/17 0940)  dicyclomine (BENTYL) capsule 10 mg (10 mg Oral Given 02/23/17 0941)     Initial Impression / Assessment and Plan / ED Course  I have reviewed the triage vital signs and the nursing notes.  Pertinent labs & imaging results that were available during my care of the patient were reviewed by me and considered in my medical decision making (see chart for details).    Benign abdominal  exam. Fetal heart tones in the 130s 140s. Monitored on tocometry with no evidence of contractions. Cleared by OB/GYN. Will treat symptomatically and have given return precautions. Patient will follow-up with her OB/GYN.     Final Clinical Impressions(s) / ED Diagnoses   Final diagnoses:  Abdominal cramps    New Prescriptions New Prescriptions   DICYCLOMINE (BENTYL) 20 MG TABLET    Take 1 tablet (20 mg total) by mouth 2 (two) times daily as needed for spasms.   FAMOTIDINE (PEPCID) 20 MG TABLET    Take 1 tablet (20 mg total) by mouth 2 (two) times daily.   METOCLOPRAMIDE (REGLAN) 10 MG TABLET    Take 1 tablet (10 mg total) by mouth every 6 (six) hours as needed for nausea or vomiting.     Loren Racer, MD 02/23/17 1037

## 2017-02-26 ENCOUNTER — Encounter: Payer: Self-pay | Admitting: Obstetrics & Gynecology

## 2017-02-26 ENCOUNTER — Ambulatory Visit (INDEPENDENT_AMBULATORY_CARE_PROVIDER_SITE_OTHER): Payer: Medicaid Other | Admitting: Obstetrics & Gynecology

## 2017-02-26 VITALS — BP 110/68 | HR 100 | Wt 220.0 lb

## 2017-02-26 DIAGNOSIS — O34219 Maternal care for unspecified type scar from previous cesarean delivery: Secondary | ICD-10-CM

## 2017-02-26 DIAGNOSIS — Z331 Pregnant state, incidental: Secondary | ICD-10-CM

## 2017-02-26 DIAGNOSIS — Z1389 Encounter for screening for other disorder: Secondary | ICD-10-CM

## 2017-02-26 DIAGNOSIS — Z3A36 36 weeks gestation of pregnancy: Secondary | ICD-10-CM

## 2017-02-26 DIAGNOSIS — Z3483 Encounter for supervision of other normal pregnancy, third trimester: Secondary | ICD-10-CM

## 2017-02-26 LAB — POCT URINALYSIS DIPSTICK
GLUCOSE UA: NEGATIVE
KETONES UA: NEGATIVE
Leukocytes, UA: NEGATIVE
Nitrite, UA: NEGATIVE
Protein, UA: NEGATIVE
RBC UA: NEGATIVE

## 2017-02-26 NOTE — Progress Notes (Signed)
[id number] [redacted]w[redacted]d Estimated Date of Delivery: 03/26/17  Blood pressure 110/68, pulse 100, weight 220 lb (99.8 kg), last menstrual period 06/19/2016, unknown if currently breastfeeding.   BP weight and urine results all reviewed and noted.  Please refer to the obstetrical flow sheet for the fundal height and fetal heart rate documentation:  Patient reports good fetal movement, denies any bleeding and no rupture of membranes symptoms or regular contractions. Patient is without complaints. All questions were answered.  Orders Placed This Encounter  Procedures  . POCT urinalysis dipstick    Plan:  Continued routine obstetrical care,  Reviewed the scenario of her Caesarean section, was induced electivley at 39 weeks and cervix was 1 cm, got to 8.5 cm and did not change in 2 hours and decision made for a Caesarean section.  Told before she left she was "narrow" and should have a repeat C section  Exam today reveals an adequate pelvis and I have encouraged patient to consider having a vagina trial, she will consider it this week and let us know her decision next week  Return in about 1 week (around 03/05/2017) for LROB.

## 2017-03-06 ENCOUNTER — Encounter: Payer: Self-pay | Admitting: Obstetrics and Gynecology

## 2017-03-06 ENCOUNTER — Ambulatory Visit (INDEPENDENT_AMBULATORY_CARE_PROVIDER_SITE_OTHER): Payer: Medicaid Other | Admitting: Obstetrics and Gynecology

## 2017-03-06 VITALS — BP 108/60 | HR 93 | Wt 219.0 lb

## 2017-03-06 DIAGNOSIS — Z1389 Encounter for screening for other disorder: Secondary | ICD-10-CM

## 2017-03-06 DIAGNOSIS — O34219 Maternal care for unspecified type scar from previous cesarean delivery: Secondary | ICD-10-CM

## 2017-03-06 DIAGNOSIS — Z3483 Encounter for supervision of other normal pregnancy, third trimester: Secondary | ICD-10-CM

## 2017-03-06 DIAGNOSIS — Z98891 History of uterine scar from previous surgery: Secondary | ICD-10-CM | POA: Insufficient documentation

## 2017-03-06 DIAGNOSIS — Z3A37 37 weeks gestation of pregnancy: Secondary | ICD-10-CM

## 2017-03-06 DIAGNOSIS — Z331 Pregnant state, incidental: Secondary | ICD-10-CM

## 2017-03-06 LAB — POCT URINALYSIS DIPSTICK
Glucose, UA: NEGATIVE
Ketones, UA: NEGATIVE
LEUKOCYTES UA: NEGATIVE
NITRITE UA: NEGATIVE
PROTEIN UA: NEGATIVE

## 2017-03-06 NOTE — Progress Notes (Signed)
Patient ID: Donika Butner, female   DOB: 05-Mar-1996, 21 y.o.   MRN: [id number] [redacted]w[redacted]d Estimated Date of Delivery: 03/26/17 LROB  Patient reports good fetal movement, denies any bleeding and no rupture of membranes symptoms or regular contractions. Patient complaints: no complaints at this time. Pt reports that she came from [place] at 34 weeks, although she notes that she had her prenatal care with her son through the [facility]. She states that she would like BTL following this delivery, but she hasn't signed BTL consent paperwork as of yet. She also voices concern for if she could have a repeat C-section scheduled for 39 weeks, but notes that if she goes into labor prior then she would consider a VBAC. She reports that she has had braxton-hicks contractions recently.    Blood pressure 108/60, pulse 93, weight 219 lb (99.3 kg), last menstrual period 06/19/2016, unknown if currently breastfeeding. refer to the ob flow sheet for FH and FHR, also BP, Wt, Urine results:notable for small amount of blood, otherwise negative                          Physical Examination:  General appearance - alert, well appearing, and in no distress Abdomen - FH 36 cm                   -FHR 144 soft, nontender, nondistended, no masses or organomegaly CERVIX: normal appearing cervix without discharge or lesions, 0.5 cm dilated, high, long, -2 to -3 presenting part is ballottable   Discussion: 1.Discussed with pt risks and benefits of permanent sterilization at a young age. Advised pt of the 3 time frames in which she could have the procedure: in the hospital after delivery, 4 wks post partum prior to becoming sexually active and 6 months post partum after risk of crib death has passed. Discouraged pt from undergoing procedure in the hospital after delivery. Encouraged pt to wait for the 6 month mark after the risk of crib death has passed.  2. Discussed risks vs benefits of using clips only vs  bilateral salpingectomy. Advised pt that bilateral salpingectomy is a permanent procedure, but reduces the risk of cancer by 2/3. Pt informed that bilateral salpingectomy would be an option at 4 weeks and 6 months post partum, but not 1 day post partum.   At end of discussion, pt had opportunity to ask questions and has no further questions at this time.   Specific discussion of permanent sterilization as noted above. Greater than 50% was spent in counseling and coordination of care with the patient.   Total time greater than: 15 minutes in addition to patient care                                                Questions were answered. Assessment: LROB G3P1011 @ [redacted]w[redacted]d                           PREVIIOUS CESAREAN DECLINING TOLAC UNLESS SHE LABORS BEFORE SCHEDULED C/S, scheduled for 16 October at 1 PM Plan:  Continued routine obstetrical care  F/u in 1 week for LROB  By signing my name below, I, Soijett Blue, attest that this documentation has been prepared under the direction and in the presence of Christin Bach  V, MD. Electronically Signed: Soijett Blue, ED Scribe. 03/06/17. 12:01 PM.  I personally performed the services described in this documentation, which was SCRIBED in my presence. The recorded information has been reviewed and considered accurate. It has been edited as necessary during review. Tilda Burrow, MD

## 2017-03-09 LAB — GC/CHLAMYDIA PROBE AMP
Chlamydia trachomatis, NAA: NEGATIVE
Neisseria gonorrhoeae by PCR: NEGATIVE

## 2017-03-10 ENCOUNTER — Encounter (HOSPITAL_COMMUNITY): Payer: Self-pay

## 2017-03-11 ENCOUNTER — Other Ambulatory Visit: Payer: Self-pay | Admitting: Obstetrics and Gynecology

## 2017-03-11 LAB — OB RESULTS CONSOLE GBS: GBS: NEGATIVE

## 2017-03-11 LAB — CULTURE, BETA STREP (GROUP B ONLY): STREP GP B CULTURE: NEGATIVE

## 2017-03-11 NOTE — Progress Notes (Signed)
Subjective:    Patient is a 21 y.o. female scheduled for Repeat cesarean section on 03/24/2017 at 1 PM. Indications for procedure are pregnancy 39 weeks, prior cesarean section declining trial of labor. Prior OB history is of a cesarean section at term in [place] while in labor reaching 8 .5 cm and they informed she would need a cesarean section. Trial of labor after cesarean has been offered and discussed and encouraged to be considered the patient is unwilling to consider trial of labor, unless she goes into labor before her scheduled surgery on the 16th   Pertinent Gynecological History: Previous low transverse cervical cesarean section in [place] Menses:  Bleeding:  Contraception: Long-acting reversible planned DES exposure: unknown Blood transfusions: none Sexually transmitted diseases: no past history Preventive screening: GC chlamydia and group B strep negative Last mammogram: Date:  Last pap:  Date:   Discussed Blood/Blood Products: yes  Menstrual History: OB History    Gravida Para Term Preterm AB Living   SAB TAB Ectopic Multiple Live Births   1       1      Menarche age:  Patient's last menstrual period was 06/19/2016.       Review of Systems Pertinent items are noted in HPI.    Objective:    LMP 06/19/2016   General:   alert, cooperative and appears stated age  Skin:   normal  HEENT:  PERRLA  Lungs:   clear to auscultation bilaterally  Heart:   regular rate and rhythm  Breasts:     Abdomen:  Gravid uterus consistent with term pregnancy, vertex presentation well-healed surgical scar lower uterine and lower abdominal transverse incision  Pelvis:  Exam deferred.       FHT: 145 BPM  Uterine Size: size equals dates  Presentation: cephalic  Cervix: Closed             Dilation: Closed      Effacement: Long            Station:  -2    Consistency: firm           Position: posterior     Assessment:     pregnancy 39 9 weeks on 16  October Previous cesarean section declining trial of labor  Plan: Repeat cesarean section to be performed 1 PM 03/24/2017      Plan:    Counseling: Procedure, risks, reasons, benefits and complications (including injury to bowel, bladder, major blood vessel, ureter, bleeding, possibility of transfusion, infection, or fistula formation) reviewed in detail. Consent signed. Preop testing ordered. Instructions reviewed, including NPO after midnight.

## 2017-03-13 ENCOUNTER — Encounter: Payer: Self-pay | Admitting: Women's Health

## 2017-03-13 ENCOUNTER — Ambulatory Visit (INDEPENDENT_AMBULATORY_CARE_PROVIDER_SITE_OTHER): Payer: Medicaid Other | Admitting: Women's Health

## 2017-03-13 VITALS — BP 110/60 | HR 76 | Wt 220.3 lb

## 2017-03-13 DIAGNOSIS — Z23 Encounter for immunization: Secondary | ICD-10-CM | POA: Diagnosis not present

## 2017-03-13 DIAGNOSIS — Z3A38 38 weeks gestation of pregnancy: Secondary | ICD-10-CM

## 2017-03-13 DIAGNOSIS — Z1389 Encounter for screening for other disorder: Secondary | ICD-10-CM

## 2017-03-13 DIAGNOSIS — Z3483 Encounter for supervision of other normal pregnancy, third trimester: Secondary | ICD-10-CM | POA: Diagnosis not present

## 2017-03-13 DIAGNOSIS — Z331 Pregnant state, incidental: Secondary | ICD-10-CM

## 2017-03-13 LAB — POCT URINALYSIS DIPSTICK
GLUCOSE UA: NEGATIVE
Ketones, UA: NEGATIVE
Leukocytes, UA: NEGATIVE
Nitrite, UA: NEGATIVE
RBC UA: NEGATIVE

## 2017-03-13 NOTE — Patient Instructions (Signed)
Call the office ([phone]) or go to [facility] if:  You begin to have strong, frequent contractions  Your water breaks.  Sometimes it is a big gush of fluid, sometimes it is just a trickle that keeps getting your panties wet or running down your legs  You have vaginal bleeding.  It is normal to have a small amount of spotting if your cervix was checked.   You don't feel your baby moving like normal.  If you don't, get you something to eat and drink and lay down and focus on feeling your baby move.  You should feel at least 10 movements in 2 hours.  If you don't, you should call the office or go to [facility].     Braxton Hicks Contractions Contractions of the uterus can occur throughout pregnancy, but they are not always a sign that you are in labor. You may have practice contractions called Braxton Hicks contractions. These false labor contractions are sometimes confused with true labor. What are Braxton Hicks contractions? Braxton Hicks contractions are tightening movements that occur in the muscles of the uterus before labor. Unlike true labor contractions, these contractions do not result in opening (dilation) and thinning of the cervix. Toward the end of pregnancy (32-34 weeks), Braxton Hicks contractions can happen more often and may become stronger. These contractions are sometimes difficult to tell apart from true labor because they can be very uncomfortable. You should not feel embarrassed if you go to the hospital with false labor. Sometimes, the only way to tell if you are in true labor is for your health care provider to look for changes in the cervix. The health care provider will do a physical exam and may monitor your contractions. If you are not in true labor, the exam should show that your cervix is not dilating and your water has not broken. If there are no prenatal problems or other health problems associated with your pregnancy, it is completely safe for you to be sent  home with false labor. You may continue to have Braxton Hicks contractions until you go into true labor. How can I tell the difference between true labor and false labor?  Differences ? False labor ? Contractions last 30-70 seconds.: Contractions are usually shorter and not as strong as true labor contractions. ? Contractions become very regular.: Contractions are usually irregular. ? Discomfort is usually felt in the top of the uterus, and it spreads to the lower abdomen and low back.: Contractions are often felt in the front of the lower abdomen and in the groin. ? Contractions do not go away with walking.: Contractions may go away when you walk around or change positions while lying down. ? Contractions usually become more intense and increase in frequency.: Contractions get weaker and are shorter-lasting as time goes on. ? The cervix dilates and gets thinner.: The cervix usually does not dilate or become thin. Follow these instructions at home:  Take over-the-counter and prescription medicines only as told by your health care provider.  Keep up with your usual exercises and follow other instructions from your health care provider.  Eat and drink lightly if you think you are going into labor.  If Braxton Hicks contractions are making you uncomfortable: ? Change your position from lying down or resting to walking, or change from walking to resting. ? Sit and rest in a tub of warm water. ? Drink enough fluid to keep your urine clear or pale yellow. Dehydration may cause these contractions. ?   Do slow and deep breathing several times an hour.  Keep all follow-up prenatal visits as told by your health care provider. This is important. Contact a health care provider if:  You have a fever.  You have continuous pain in your abdomen. Get help right away if:  Your contractions become stronger, more regular, and closer together.  You have fluid leaking or gushing from your vagina.  You  pass blood-tinged mucus (bloody show).  You have bleeding from your vagina.  You have low back pain that you never had before.  You feel your baby's head pushing down and causing pelvic pressure.  Your baby is not moving inside you as much as it used to. Summary  Contractions that occur before labor are called Braxton Hicks contractions, false labor, or practice contractions.  Braxton Hicks contractions are usually shorter, weaker, farther apart, and less regular than true labor contractions. True labor contractions usually become progressively stronger and regular and they become more frequent.  Manage discomfort from Braxton Hicks contractions by changing position, resting in a warm bath, drinking plenty of water, or practicing deep breathing. This information is not intended to replace advice given to you by your health care provider. Make sure you discuss any questions you have with your health care provider. Document Released: 05/26/2005 Document Revised: 04/14/2016 Document Reviewed: 04/14/2016 Elsevier Interactive Patient Education  2017 [employer] Inc[employer]  

## 2017-03-13 NOTE — Progress Notes (Signed)
   [facility] Low-Risk Pregnancy Visit  Patient name: Courtney Zhang MRN [id number]  Date of birth: 26-Apr-1996 CC & HPI:  Courtney Zhang is a 21 y.o. G22P1011 female at [redacted]w[redacted]d with an Estimated Date of Delivery: 03/26/17 being seen today for ongoing management of a low-risk pregnancy.  Today she reports no complaints. Scheduled for RCS 10/16, states if she goes into labor prior, she is interested in [facility]. Interested in [place], but not certain.  Review of Systems:   Reports good fm.   Denies regular contractions, leakage of fluid, vaginal bleeding, abnormal vaginal discharge w/ itching/odor/irritation, headaches, visual changes, shortness of breath, chest pain, abdominal pain, severe nausea/vomiting, or problems with urination or bowel movements unless otherwise stated above. Pertinent History Reviewed:  Reviewed past medical,surgical, social and family history.  Reviewed problem list, medications and allergies. Objective Findings:   Vitals:   03/13/17 1257  BP: 110/60  Pulse: 76  Weight: 220 lb 4.8 oz (99.9 kg)    Body mass index is 33.5 kg/m.  Fundal height: 36 Fetal heart rate: 150 SVE per request: 1/th/-2, vtx  Results for orders placed or performed in visit on 03/13/17 (from the past 24 hour(s))  POCT urinalysis dipstick   Collection Time: 03/13/17  1:01 PM  Result Value Ref Range   Color, UA     Clarity, UA     Glucose, UA neg    Bilirubin, UA     Ketones, UA neg    Spec Grav, UA  1.010 - 1.025   Blood, UA neg    pH, UA  5.0 - 8.0   Protein, UA trace    Urobilinogen, UA  0.2 or 1.0 E.U./dL   Nitrite, UA neg    Leukocytes, UA Negative Negative    Assessment & Plan:  1) Low-risk pregnancy G3P1011 at [redacted]w[redacted]d with an Estimated Date of Delivery: 03/26/17  2) Prev c/s> scheduled for RCS 10/16, interested in TOLAC if labors before  Nexplanon brochure given, let us know if she decides for it, so can be ordered  Flu shot today  Reviewed: labor s/s, fkc. All  questions were answered Plan:  Continue routine obstetrical care   Return in about 1 week (around 03/20/2017) for LROB w/ JVF.  Orders Placed This Encounter  Procedures  . POCT urinalysis dipstick   Marge Duncans CNM, [facility] 03/13/2017 1:30 PM

## 2017-03-20 ENCOUNTER — Encounter: Payer: Medicaid Other | Admitting: Obstetrics and Gynecology

## 2017-03-20 NOTE — Patient Instructions (Signed)
Jamerica Snavely  03/20/2017   Your procedure is scheduled on:  03/24/2017  Enter through the Main Entrance of [facility] at 1100 AM.  Pick up the phone at the desk and dial [phone]  Call this number if you have problems the morning of surgery:[phone]  Remember:   Do not eat food:After Midnight.  Do not drink clear liquids: After Midnight.  Take these medicines the morning of surgery with A SIP OF WATER: none   Do not wear jewelry, make-up or nail polish.  Do not wear lotions, powders, or perfumes. Do not wear deodorant.  Do not shave 48 hours prior to surgery.  Do not bring valuables to the hospital.  [facility] is not   responsible for any belongings or valuables brought to the hospital.  Contacts, dentures or bridgework may not be worn into surgery.  Leave suitcase in the car. After surgery it may be brought to your room.  For patients admitted to the hospital, checkout time is 11:00 AM the day of              discharge.    N/A   Please read over the following fact sheets that you were given:   Surgical Site Infection Prevention

## 2017-03-21 ENCOUNTER — Encounter (HOSPITAL_COMMUNITY)
Admission: RE | Disposition: A | Discharge status: Home / Self Care | Payer: Self-pay | Source: Ambulatory Visit | Attending: Obstetrics and Gynecology

## 2017-03-21 ENCOUNTER — Encounter (HOSPITAL_COMMUNITY): Payer: Self-pay

## 2017-03-21 ENCOUNTER — Inpatient Hospital Stay (HOSPITAL_COMMUNITY): Payer: Medicaid Other | Admitting: Anesthesiology

## 2017-03-21 ENCOUNTER — Inpatient Hospital Stay (HOSPITAL_COMMUNITY)
Admission: RE | Admit: 2017-03-21 | Discharge: 2017-03-23 | DRG: 806 | Disposition: A | Discharge status: Home / Self Care | Payer: Medicaid Other | Source: Ambulatory Visit | Attending: Obstetrics and Gynecology | Admitting: Obstetrics and Gynecology

## 2017-03-21 DIAGNOSIS — O34211 Maternal care for low transverse scar from previous cesarean delivery: Principal | ICD-10-CM | POA: Diagnosis present

## 2017-03-21 DIAGNOSIS — F1290 Cannabis use, unspecified, uncomplicated: Secondary | ICD-10-CM | POA: Diagnosis present

## 2017-03-21 DIAGNOSIS — O4292 Full-term premature rupture of membranes, unspecified as to length of time between rupture and onset of labor: Secondary | ICD-10-CM | POA: Diagnosis present

## 2017-03-21 DIAGNOSIS — K219 Gastro-esophageal reflux disease without esophagitis: Secondary | ICD-10-CM | POA: Diagnosis present

## 2017-03-21 DIAGNOSIS — Z3A39 39 weeks gestation of pregnancy: Secondary | ICD-10-CM

## 2017-03-21 DIAGNOSIS — O9962 Diseases of the digestive system complicating childbirth: Secondary | ICD-10-CM | POA: Diagnosis present

## 2017-03-21 DIAGNOSIS — Z3483 Encounter for supervision of other normal pregnancy, third trimester: Secondary | ICD-10-CM

## 2017-03-21 DIAGNOSIS — O99324 Drug use complicating childbirth: Secondary | ICD-10-CM | POA: Diagnosis present

## 2017-03-21 DIAGNOSIS — O26893 Other specified pregnancy related conditions, third trimester: Secondary | ICD-10-CM | POA: Diagnosis present

## 2017-03-21 LAB — POCT FERN TEST: POCT Fern Test: POSITIVE

## 2017-03-21 LAB — CBC
HCT: 35.2 % — ABNORMAL LOW (ref 36.0–46.0)
Hemoglobin: 11.9 g/dL — ABNORMAL LOW (ref 12.0–15.0)
MCH: 27.5 pg (ref 26.0–34.0)
MCHC: 33.8 g/dL (ref 30.0–36.0)
MCV: 81.5 fL (ref 78.0–100.0)
PLATELETS: 201 10*3/uL (ref 150–400)
RBC: 4.32 MIL/uL (ref 3.87–5.11)
RDW: 14.1 % (ref 11.5–15.5)
WBC: 10.1 10*3/uL (ref 4.0–10.5)

## 2017-03-21 LAB — TYPE AND SCREEN
ABO/RH(D): O POS
Antibody Screen: NEGATIVE

## 2017-03-21 LAB — ABO/RH: ABO/RH(D): O POS

## 2017-03-21 SURGERY — Surgical Case
Anesthesia: Regional

## 2017-03-21 MED ORDER — COCONUT OIL OIL: 1.0000 "application " | TOPICAL_OIL | Status: DC | PRN

## 2017-03-21 MED ORDER — SIMETHICONE 80 MG PO CHEW: 80.0000 mg | CHEWABLE_TABLET | ORAL | Status: DC | PRN

## 2017-03-21 MED ORDER — DIBUCAINE 1 % RE OINT: 1.0000 "application " | TOPICAL_OINTMENT | RECTAL | Status: DC | PRN

## 2017-03-21 MED ORDER — OXYTOCIN 40 UNITS IN LACTATED RINGERS INFUSION - SIMPLE MED: 2.5000 [IU]/h | INTRAVENOUS | Status: DC

## 2017-03-21 MED ORDER — ONDANSETRON HCL 4 MG PO TABS: 4.0000 mg | ORAL_TABLET | ORAL | Status: DC | PRN

## 2017-03-21 MED ORDER — IBUPROFEN 600 MG PO TABS
600.0000 mg | ORAL_TABLET | Freq: Four times a day (QID) | ORAL | Status: DC
  Administered 2017-03-21 – 2017-03-23 (×6): 600 mg via ORAL
  Filled 2017-03-21 (×7): qty 1

## 2017-03-21 MED ORDER — LIDOCAINE HCL (PF) 1 % IJ SOLN
30.0000 mL | INTRAMUSCULAR | Status: DC | PRN
  Filled 2017-03-21: qty 30

## 2017-03-21 MED ORDER — FENTANYL CITRATE (PF) 100 MCG/2ML IJ SOLN
100.0000 ug | INTRAMUSCULAR | Status: DC | PRN
  Administered 2017-03-21: 100 ug via INTRAVENOUS
  Filled 2017-03-21: qty 2

## 2017-03-21 MED ORDER — TETANUS-DIPHTH-ACELL PERTUSSIS 5-2.5-18.5 LF-MCG/0.5 IM SUSP: 0.5000 mL | Freq: Once | INTRAMUSCULAR | Status: DC

## 2017-03-21 MED ORDER — FLEET ENEMA 7-19 GM/118ML RE ENEM: 1.0000 | ENEMA | RECTAL | Status: DC | PRN

## 2017-03-21 MED ORDER — LIDOCAINE HCL (PF) 1 % IJ SOLN
INTRAMUSCULAR | Status: DC | PRN
  Administered 2017-03-21 (×2): 4 mL

## 2017-03-21 MED ORDER — DIPHENHYDRAMINE HCL 25 MG PO CAPS: 25.0000 mg | ORAL_CAPSULE | Freq: Four times a day (QID) | ORAL | Status: DC | PRN

## 2017-03-21 MED ORDER — LACTATED RINGERS IV SOLN: 500.0000 mL | INTRAVENOUS | Status: DC | PRN

## 2017-03-21 MED ORDER — SOD CITRATE-CITRIC ACID 500-334 MG/5ML PO SOLN
30.0000 mL | ORAL | Status: DC | PRN
  Administered 2017-03-21: 30 mL via ORAL
  Filled 2017-03-21: qty 15

## 2017-03-21 MED ORDER — TERBUTALINE SULFATE 1 MG/ML IJ SOLN: 0.2500 mg | Freq: Once | INTRAMUSCULAR | Status: DC | PRN

## 2017-03-21 MED ORDER — ONDANSETRON HCL 4 MG/2ML IJ SOLN: 4.0000 mg | INTRAMUSCULAR | Status: DC | PRN

## 2017-03-21 MED ORDER — PHENYLEPHRINE 40 MCG/ML (10ML) SYRINGE FOR IV PUSH (FOR BLOOD PRESSURE SUPPORT)
80.0000 ug | PREFILLED_SYRINGE | INTRAVENOUS | Status: DC | PRN
  Filled 2017-03-21: qty 5

## 2017-03-21 MED ORDER — ONDANSETRON HCL 4 MG/2ML IJ SOLN
4.0000 mg | Freq: Four times a day (QID) | INTRAMUSCULAR | Status: DC | PRN
  Administered 2017-03-21: 4 mg via INTRAVENOUS
  Filled 2017-03-21: qty 2

## 2017-03-21 MED ORDER — DIPHENHYDRAMINE HCL 50 MG/ML IJ SOLN: 12.5000 mg | INTRAMUSCULAR | Status: DC | PRN

## 2017-03-21 MED ORDER — FENTANYL 2.5 MCG/ML BUPIVACAINE 1/10 % EPIDURAL INFUSION (WH - ANES)
14.0000 mL/h | INTRAMUSCULAR | Status: DC | PRN
Start: 2017-03-21 — End: 2017-03-21

## 2017-03-21 MED ORDER — OXYCODONE-ACETAMINOPHEN 5-325 MG PO TABS: 1.0000 | ORAL_TABLET | ORAL | Status: DC | PRN

## 2017-03-21 MED ORDER — PRENATAL MULTIVITAMIN CH
1.0000 | ORAL_TABLET | Freq: Every day | ORAL | Status: DC
  Administered 2017-03-22: 1 via ORAL
  Filled 2017-03-21: qty 1

## 2017-03-21 MED ORDER — LACTATED RINGERS IV SOLN
500.0000 mL | Freq: Once | INTRAVENOUS | Status: AC
  Administered 2017-03-21: 500 mL via INTRAVENOUS

## 2017-03-21 MED ORDER — EPHEDRINE 5 MG/ML INJ
10.0000 mg | INTRAVENOUS | Status: DC | PRN
  Filled 2017-03-21: qty 2

## 2017-03-21 MED ORDER — ZOLPIDEM TARTRATE 5 MG PO TABS: 5.0000 mg | ORAL_TABLET | Freq: Every evening | ORAL | Status: DC | PRN

## 2017-03-21 MED ORDER — PHENYLEPHRINE 40 MCG/ML (10ML) SYRINGE FOR IV PUSH (FOR BLOOD PRESSURE SUPPORT)
80.0000 ug | PREFILLED_SYRINGE | INTRAVENOUS | Status: DC | PRN
  Filled 2017-03-21: qty 10
  Filled 2017-03-21: qty 5

## 2017-03-21 MED ORDER — FENTANYL CITRATE (PF) 100 MCG/2ML IJ SOLN
INTRAMUSCULAR | Status: AC
  Administered 2017-03-21: 100 ug via INTRAVENOUS
  Filled 2017-03-21: qty 2

## 2017-03-21 MED ORDER — ACETAMINOPHEN 325 MG PO TABS: 650.0000 mg | ORAL_TABLET | ORAL | Status: DC | PRN

## 2017-03-21 MED ORDER — ACETAMINOPHEN 325 MG PO TABS
650.0000 mg | ORAL_TABLET | ORAL | Status: DC | PRN
  Administered 2017-03-22 (×2): 650 mg via ORAL
  Filled 2017-03-21 (×2): qty 2

## 2017-03-21 MED ORDER — OXYCODONE-ACETAMINOPHEN 5-325 MG PO TABS: 2.0000 | ORAL_TABLET | ORAL | Status: DC | PRN

## 2017-03-21 MED ORDER — OXYTOCIN BOLUS FROM INFUSION
500.0000 mL | Freq: Once | INTRAVENOUS | Status: AC
  Administered 2017-03-21: 500 mL via INTRAVENOUS

## 2017-03-21 MED ORDER — BENZOCAINE-MENTHOL 20-0.5 % EX AERO
1.0000 "application " | INHALATION_SPRAY | CUTANEOUS | Status: DC | PRN
  Administered 2017-03-23 (×2): 1 via TOPICAL
  Filled 2017-03-21: qty 56
  Filled 2017-03-21: qty 112

## 2017-03-21 MED ORDER — FENTANYL 2.5 MCG/ML BUPIVACAINE 1/10 % EPIDURAL INFUSION (WH - ANES)
14.0000 mL/h | INTRAMUSCULAR | Status: DC | PRN
  Administered 2017-03-21 (×2): 14 mL/h via EPIDURAL
  Filled 2017-03-21 (×2): qty 100

## 2017-03-21 MED ORDER — LACTATED RINGERS IV SOLN
INTRAVENOUS | Status: DC
  Administered 2017-03-21 (×3): via INTRAVENOUS

## 2017-03-21 MED ORDER — WITCH HAZEL-GLYCERIN EX PADS: 1.0000 "application " | MEDICATED_PAD | CUTANEOUS | Status: DC | PRN

## 2017-03-21 MED ORDER — OXYTOCIN 40 UNITS IN LACTATED RINGERS INFUSION - SIMPLE MED
1.0000 m[IU]/min | INTRAVENOUS | Status: DC
  Administered 2017-03-21: 1 m[IU]/min via INTRAVENOUS

## 2017-03-21 MED ORDER — FENTANYL 2.5 MCG/ML BUPIVACAINE 1/10 % EPIDURAL INFUSION (WH - ANES): 14.0000 mL/h | INTRAMUSCULAR | Status: DC | PRN

## 2017-03-21 MED ORDER — SENNOSIDES-DOCUSATE SODIUM 8.6-50 MG PO TABS
2.0000 | ORAL_TABLET | ORAL | Status: DC
  Administered 2017-03-21 – 2017-03-22 (×2): 2 via ORAL
  Filled 2017-03-21 (×2): qty 2

## 2017-03-21 NOTE — H&P (Signed)
LABOR AND DELIVERY ADMISSION HISTORY AND PHYSICAL NOTE  Courtney Zhang is a 21 y.o. female G3P1011 with IUP at [redacted]w[redacted]d by LMP presenting for SROM. She was at home and was asleep and noticed that her membranes ruptured. Presented to [facility] and had positive fern. Denies any CP, SOB, headaches, changes in vision or LE swelling. Directly admitted for SROM   She reports positive fetal movement. She denies leakage of fluid or vaginal bleeding.  Prenatal History/Complications:  Past Medical History: Past Medical History:  Diagnosis Date  . Abdominal cramping 07/14/2014  . Amenorrhea 07/14/2014  . Chlamydia   . Contraceptive education 11/07/2013  . GERD (gastroesophageal reflux disease)   . PCO (polycystic ovaries) 08/02/2014  . Pelvic pain in female 07/14/2014  . Positive pregnancy test 08/02/2014    Past Surgical History: Past Surgical History:  Procedure Laterality Date  . CESAREAN SECTION    . CHOLECYSTECTOMY  10/01/2016  . DILATION AND CURETTAGE OF UTERUS  12/13/2011?    Obstetrical History: OB History    Gravida Para Term Preterm AB Living   SAB TAB Ectopic Multiple Live Births   1       1      Social History: Social History   Social History  . Marital status: Single    Spouse name: N/A  . Number of children: N/A  . Years of education: N/A   Social History Main Topics  . Smoking status: Never Smoker  . Smokeless tobacco: Never Used  . Alcohol use No  . Drug use: Yes    Types: Marijuana     Comment: 2 times per week  . Sexual activity: Not Currently    Birth control/ protection: None   Other Topics Concern  . None   Social History Narrative  . None    Family History: Family History  Problem Relation Age of Onset  . Asthma Brother   . Diabetes Maternal Grandmother   . Hypertension Maternal Grandmother   . Diabetes Maternal Grandfather   . Diabetes Paternal Grandmother   . Asthma Brother   . Asthma Brother   . Asthma Brother     Allergies: No  Known Allergies  Prescriptions Prior to Admission  Medication Sig Dispense Refill Last Dose  . prenatal vitamin w/FE, FA (PRENATAL 1 + 1) 27-1 MG TABS tablet Take 1 tablet by mouth daily at 12 noon. 30 each 11 03/20/2017 at 1900  . dicyclomine (BENTYL) 20 MG tablet Take 1 tablet (20 mg total) by mouth 2 (two) times daily as needed for spasms. (Patient not taking: Reported on 02/26/2017) 20 tablet 0 Completed Course at Unknown time  . famotidine (PEPCID) 20 MG tablet Take 1 tablet (20 mg total) by mouth 2 (two) times daily. (Patient not taking: Reported on 03/06/2017) 30 tablet 0 Not Taking at Unknown time  . metoCLOPramide (REGLAN) 10 MG tablet Take 1 tablet (10 mg total) by mouth every 6 (six) hours as needed for nausea or vomiting. (Patient not taking: Reported on 03/06/2017) 10 tablet 0 Completed Course at Unknown time     Review of Systems   All systems reviewed and negative except as stated in HPI  Blood pressure 115/86, pulse 94, temperature 98.7 F (37.1 C), temperature source Axillary, resp. rate 16, height  (1.727 m), weight 98.4 kg (217 lb), last menstrual period 06/19/2016, unknown if currently breastfeeding. General appearance: alert Lungs: clear to auscultation bilaterally Heart: regular rate and rhythm Abdomen: soft,  non-tender; bowel sounds normal Extremities: No calf swelling or tenderness Presentation: cephalic vertex Fetal monitoring: 150 BPM. Mod variability, pos acccels, no decels Uterine activity: contractions q2-65min Dilation: Fingertip Effacement (%): Thick Station: Ballotable Exam by:: L.STUBBS, RN   Prenatal labs: ABO, Rh: --/--/O POS, O POS (10/13 0250) Antibody: NEG (10/13 0250) Rubella: Immune (02/13 0000) RPR: Nonreactive (02/13 0000)  HBsAg: Negative (02/13 0000)  HIV: Non-reactive (02/13 0000)  GBS: Negative (10/03 0000)  1 hr Glucola: 98 Genetic screening:  declined Anatomy US: normal  Prenatal Transfer Tool  Maternal Diabetes: No Genetic  Screening: Declined Maternal Ultrasounds/Referrals: Normal Fetal Ultrasounds or other Referrals:  None Maternal Substance Abuse:  Yes:  Type: Marijuana Significant Maternal Medications:  None Significant Maternal Lab Results: None  Results for orders placed or performed during the hospital encounter of 03/21/17 (from the past 24 hour(s))  Fern Test   Collection Time: 03/21/17  2:01 AM  Result Value Ref Range   POCT Fern Test Positive = ruptured amniotic membanes   CBC   Collection Time: 03/21/17  2:50 AM  Result Value Ref Range   WBC 10.1 4.0 - 10.5 K/uL   RBC 4.32 3.87 - 5.11 MIL/uL   Hemoglobin 11.9 (L) 12.0 - 15.0 g/dL   HCT 16.1 (L) 09.6 - 04.5 %   MCV 81.5 78.0 - 100.0 fL   MCH 27.5 26.0 - 34.0 pg   MCHC 33.8 30.0 - 36.0 g/dL   RDW 40.9 81.1 - 91.4 %   Platelets 201 150 - 400 K/uL  Type and screen [facility]   Collection Time: 03/21/17  2:50 AM  Result Value Ref Range   ABO/RH(D) O POS    Antibody Screen NEG    Sample Expiration 03/24/2017   ABO/Rh   Collection Time: 03/21/17  2:50 AM  Result Value Ref Range   ABO/RH(D) O POS     Patient Active Problem List   Diagnosis Date Noted  . Pregnancy 03/21/2017  . Previous cesarean delivery affecting pregnancy hesitant to TOLAC. 03/06/2017  . Supervision of normal pregnancy 02/17/2017  . PCO (polycystic ovaries) 08/02/2014  . Pelvic pain in female 07/14/2014  . Amenorrhea 07/14/2014    Assessment: Courtney Zhang is a 21 y.o. G3P1011 at [redacted]w[redacted]d here for SROM. Patient is currently comfortable. Admits to using marijuana last week. Will attempt TOLAC. Only 0.5cm, will hold off on pit and let patient try to progress on her own for 2-3 hours and will reassess.   #Labor: SROM, will continue to monitor for progress and attempt foley bulb when able #Pain: epidural #FWB: Cat 1 #ID: GBS neg #MOF: bottle #MOC: pills #Circ:  NA  Renne Musca, MD PGY-2 03/21/2017, 4:24 AM  CNM attestation:  I have  seen and examined this patient; I agree with above documentation in the resident's note.   Courtney Zhang is a 21 y.o. G3P1011 here for ROM/TOLAC  PE: BP 117/68   Pulse 88   Temp 98.7 F (37.1 C) (Axillary)   Resp 16   Ht  (1.727 m)   Wt 98.4 kg (217 lb)   LMP 06/19/2016   BMI 32.99 kg/m  Gen: calm comfortable, NAD Resp: normal effort, no distress Abd: gravid  ROS, labs, PMH reviewed  Plan: Admit to [employer] on TOLAC for now Will manage expectantly for now; may consider a cervical foley Anticipate VBAC  Shafiq Larch CNM 03/21/2017, 7:25 AM

## 2017-03-21 NOTE — Anesthesia Preprocedure Evaluation (Signed)
Anesthesia Evaluation  Patient identified by MRN, date of birth, ID band Patient awake    Reviewed: Allergy & Precautions, NPO status , Patient's Chart, lab work & pertinent test results  Airway Mallampati: II  TM Distance: >3 FB Neck ROM: Full    Dental no notable dental hx.    Pulmonary neg pulmonary ROS,    Pulmonary exam normal breath sounds clear to auscultation       Cardiovascular negative cardio ROS Normal cardiovascular exam Rhythm:Regular Rate:Normal     Neuro/Psych negative neurological ROS  negative psych ROS   GI/Hepatic Neg liver ROS, GERD  ,  Endo/Other  negative endocrine ROS  Renal/GU negative Renal ROS     Musculoskeletal negative musculoskeletal ROS (+)   Abdominal   Peds  Hematology negative hematology ROS (+)   Anesthesia Other Findings   Reproductive/Obstetrics (+) Pregnancy                             Anesthesia Physical Anesthesia Plan  ASA: II  Anesthesia Plan: Epidural   Post-op Pain Management:    Induction:   PONV Risk Score and Plan:   Airway Management Planned:   Additional Equipment:   Intra-op Plan:   Post-operative Plan:   Informed Consent: I have reviewed the patients History and Physical, chart, labs and discussed the procedure including the risks, benefits and alternatives for the proposed anesthesia with the patient or authorized representative who has indicated his/her understanding and acceptance.     Plan Discussed with:   Anesthesia Plan Comments:         Anesthesia Quick Evaluation

## 2017-03-21 NOTE — Anesthesia Procedure Notes (Signed)
Epidural Patient location during procedure: OB  Staffing Anesthesiologist: Lewie Loron Performed: anesthesiologist   Preanesthetic Checklist Completed: patient identified, pre-op evaluation, timeout performed, IV checked, risks and benefits discussed and monitors and equipment checked  Epidural Patient position: sitting Prep: site prepped and draped and DuraPrep Patient monitoring: heart rate Approach: midline Location: L3-L4 Injection technique: LOR air and LOR saline  Needle:  Needle type: Tuohy  Needle gauge: 17 G Needle length: 9 cm Needle insertion depth: 6 cm Catheter type: closed end flexible Catheter size: 19 Gauge Catheter at skin depth: 11 cm Test dose: negative  Assessment Sensory level: T8 Events: blood not aspirated, injection not painful, no injection resistance, negative IV test and no paresthesia  Additional Notes Reason for block:procedure for pain

## 2017-03-21 NOTE — Anesthesia Preprocedure Evaluation (Signed)
Anesthesia Evaluation  Patient identified by MRN, date of birth, ID band Patient awake    Reviewed: Allergy & Precautions, NPO status , Patient's Chart, lab work & pertinent test results  Airway Mallampati: II  TM Distance: >3 FB Neck ROM: Full    Dental no notable dental hx.    Pulmonary neg pulmonary ROS,    Pulmonary exam normal breath sounds clear to auscultation       Cardiovascular negative cardio ROS Normal cardiovascular exam Rhythm:Regular Rate:Normal     Neuro/Psych negative neurological ROS  negative psych ROS   GI/Hepatic Neg liver ROS, GERD  ,  Endo/Other  negative endocrine ROS  Renal/GU negative Renal ROS     Musculoskeletal negative musculoskeletal ROS (+)   Abdominal   Peds  Hematology negative hematology ROS (+)   Anesthesia Other Findings   Reproductive/Obstetrics (+) Pregnancy                             Anesthesia Physical Anesthesia Plan  ASA: II  Anesthesia Plan: Epidural   Post-op Pain Management:    Induction:   PONV Risk Score and Plan:   Airway Management Planned:   Additional Equipment:   Intra-op Plan:   Post-operative Plan:   Informed Consent: I have reviewed the patients History and Physical, chart, labs and discussed the procedure including the risks, benefits and alternatives for the proposed anesthesia with the patient or authorized representative who has indicated his/her understanding and acceptance.     Plan Discussed with:   Anesthesia Plan Comments:         Anesthesia Quick Evaluation  

## 2017-03-21 NOTE — Progress Notes (Signed)
POSTPARTUM PROGRESS NOTE  Post Partum Day 1 Subjective:  Courtney Zhang is a 21 y.o. [id number] [redacted]w[redacted]d s/p VBAC.  No acute events overnight.  Pt denies problems with ambulating, voiding or po intake.  She denies nausea or vomiting.  Pain is well controlled.  Lochia Small.   Objective: Blood pressure 128/78, pulse 84, temperature 98.6 F (37 C), temperature source Oral, resp. rate 18, height  (1.727 m), weight 217 lb (98.4 kg), last menstrual period 06/19/2016, SpO2 100 %, unknown if currently breastfeeding.  Physical Exam:  General: alert, cooperative and no distress Lochia:normal flow Chest: no respiratory distress Heart:regular rate, distal pulses intact Abdomen: soft, nontender,  Uterine Fundus: firm, appropriately tender DVT Evaluation: No calf swelling or tenderness Extremities:  edema   Recent Labs  03/21/17 0250 03/22/17 0558  HGB 11.9* 10.3*  HCT 35.2* 31.7*    Assessment/Plan:  ASSESSMENT: Courtney Zhang is a 21 y.o. J4N8295 [redacted]w[redacted]d s/p VBAC  Plan for discharge tomorrow   LOS: 1 day   Courtney Brossard MossMD 03/21/2017, 9:45 PM

## 2017-03-21 NOTE — Anesthesia Pain Management Evaluation Note (Signed)
  CRNA Pain Management Visit Note  Patient: Courtney Zhang, 21 y.o., female  "Hello I am a member of the anesthesia team at [facility]. We have an anesthesia team available at all times to provide care throughout the hospital, including epidural management and anesthesia for C-section. I don't know your plan for the delivery whether it a natural birth, water birth, IV sedation, nitrous supplementation, doula or epidural, but we want to meet your pain goals."   1.Was your pain managed to your expectations on prior hospitalizations?   Yes   2.What is your expectation for pain management during this hospitalization?     Epidural  3.How can we help you reach that goal? epidural  Record the patient's initial score and the patient's pain goal.   Pain: 0  Pain Goal: 5 [facility] wants you to be able to say your pain was always managed very well.  Antiono Ettinger 03/21/2017

## 2017-03-21 NOTE — Progress Notes (Signed)
   Courtney Zhang is a 21 y.o. G3P1011 at [redacted]w[redacted]d  admitted for induction of labor due to Spontaneous rupture of BOW.  Subjective: Patient coping well; comfortable with epidural.   Objective: Vitals:   03/21/17 1330 03/21/17 1400 03/21/17 1430 03/21/17 1500  BP: 115/75 111/79 112/78   Pulse: 77 83 89   Resp: Temp:      TempSrc:      SpO2:      Weight:      Height:       Total I/O In: 60 [P.O.:60] Out: -   FHT:  FHR: 140 bpm, variability: moderate,  accelerations:  Present,  decelerations:  Present variables UC:   irregular, every 2-3 minutes SVE:   Dilation: 6 Effacement (%): 80 Station: -2, -1 Exam by:: Samara Deist, CNM  Pitocin @ 22mu/min  Labs: Lab Results  Component Value Date   WBC 10.1 03/21/2017   HGB 11.9 (L) 03/21/2017   HCT 35.2 (L) 03/21/2017   MCV 81.5 03/21/2017   PLT 201 03/21/2017    Assessment / Plan: Induction of labor due to PROM,  progressing well on pitocin  Labor: Progressing normally Fetal Wellbeing:  Category II Pain Control:  Epidural Anticipated MOD:  NSVD  IUPC placed; patient repositioned to left side. Will consider amnioinfusion if variables become more severe or frequent.   Courtney Zhang 03/21/2017, 3:01 PM

## 2017-03-21 NOTE — MAU Note (Signed)
Water broke about 2400 Cont.to leak fld with some blood in it. Missed appt on Friday due to weather.

## 2017-03-21 NOTE — Progress Notes (Signed)
Patient ID: Courtney Zhang, female   DOB: 23-Mar-1996, 23 y.o.   MRN: [id number]  Sitting up in bed, not coping well w/ ctx; requesting epidural  BP 117/68, other VSS FHR 130s, +accels, occ variables, +LTV Ctx q 2 mins Cx 3/80/-2  IUP@term  SROM/latent labor TOLAC  Will have anesthesia place epidural Anticipate continued labor progression spont  Cam Hai CNM 03/21/2017 7:23 AM

## 2017-03-21 NOTE — Progress Notes (Signed)
   Courtney Zhang is a 21 y.o. G3P1011 at [redacted]w[redacted]d  admitted for induction of labor due to Spontaneous rupture of BOW.  Subjective: Patient doing well; feeling some contractions.   Objective: Vitals:   03/21/17 0835 03/21/17 0900 03/21/17 0930 03/21/17 1000  BP: 100/66 (!) 93/59 (!) 85/58 107/60  Pulse: (!) 109 83 77 81  Resp:  18  18  Temp:      TempSrc:      SpO2: 100%     Weight:      Height:       Total I/O In: 60 [P.O.:60] Out: -   FHT:  FHR: 135 bpm, variability: moderate,  accelerations:  Present,  decelerations:  Absent UC:   irregular, every 3-6 minutes SVE:   Dilation: 3.5 Effacement (%): 60 Station: -3 Exam by:: Marcelino Duster, RN  Pitocin @ 2 mu/min  Labs: Lab Results  Component Value Date   WBC 10.1 03/21/2017   HGB 11.9 (L) 03/21/2017   HCT 35.2 (L) 03/21/2017   MCV 81.5 03/21/2017   PLT 201 03/21/2017    Assessment / Plan: Augmentation of labor, progressing well  Labor: early labor; on low dose pitocin Fetal Wellbeing:  Category I Pain Control:  Epidural Anticipated MOD:  NSVD  Patient to sign vbac consent now; continue with low dose pitocin.  Charlesetta Garibaldi Kooistra 03/21/2017, 10:29 AM

## 2017-03-22 LAB — CBC
HEMATOCRIT: 31.7 % — AB (ref 36.0–46.0)
Hemoglobin: 10.3 g/dL — ABNORMAL LOW (ref 12.0–15.0)
MCH: 27 pg (ref 26.0–34.0)
MCHC: 32.5 g/dL (ref 30.0–36.0)
MCV: 83.2 fL (ref 78.0–100.0)
PLATELETS: 184 10*3/uL (ref 150–400)
RBC: 3.81 MIL/uL — ABNORMAL LOW (ref 3.87–5.11)
RDW: 14.2 % (ref 11.5–15.5)
WBC: 13.9 10*3/uL — ABNORMAL HIGH (ref 4.0–10.5)

## 2017-03-22 LAB — RPR: RPR: NONREACTIVE

## 2017-03-22 NOTE — Anesthesia Postprocedure Evaluation (Signed)
Anesthesia Post Note  Patient: Salam Chesterfield  Procedure(s) Performed: AN AD HOC LABOR EPIDURAL     Patient location during evaluation: Mother Baby Anesthesia Type: Epidural Level of consciousness: awake and alert and oriented Pain management: satisfactory to patient Vital Signs Assessment: post-procedure vital signs reviewed and stable Respiratory status: spontaneous breathing and nonlabored ventilation Cardiovascular status: stable Postop Assessment: no headache, no backache, no signs of nausea or vomiting, adequate PO intake and patient able to bend at knees (patient up walking) Anesthetic complications: no    Last Vitals:  Vitals:   03/22/17 0245 03/22/17 0416  BP: 122/64 132/85  Pulse: 74 79  Resp: 18 18  Temp: 36.9 C 36.7 C  SpO2: 99% 100%    Last Pain:  Vitals:   03/22/17 0743  TempSrc:   PainSc: 0-No pain   Pain Goal: Patients Stated Pain Goal: 0 (03/21/17 0805)               Madison Hickman

## 2017-03-22 NOTE — Discharge Instructions (Signed)

## 2017-03-22 NOTE — Clinical Social Work Maternal (Signed)
CLINICAL SOCIAL WORK MATERNAL/CHILD NOTE  Patient Details  Name: Courtney Zhang MRN: [id number] Date of Birth: 07/08/1995  Date:  03/22/2017  Clinical Social Worker Initiating Note:  Imajean Mcdermid Boyd-Gilyard Date/Time: Initiated:  03/22/17/1523     Child's Name:  Courtney Zhang   Biological Parents:  Mother (Per MOB, FOB will not be involved. )   Need for Interpreter:  None   Reason for Referral:  Current Substance Use/Substance Use During Pregnancy  (hx of marijuana use during pregnancy. )   Address:  [address] [place] [address]    Phone number:  [phone] (home)     Additional phone number:   Household Members/Support Persons (HM/SP):   Household Member/Support Person 1   HM/SP Name Relationship DOB or Age  HM/SP -1 [place] Wiley  son 03/31/15  HM/SP -2        HM/SP -3        HM/SP -4        HM/SP -5        HM/SP -6        HM/SP -7        HM/SP -8          Natural Supports (not living in the home):  Immediate Family, Parent, Extended Family   Professional Supports: None   Employment: Unemployed   Type of Work:     Education:  9 to 11 years   Homebound arranged:    Financial Resources:      Other Resources:  WIC, Food Stamps    Cultural/Religious Considerations Which May Impact Care:    Strengths:  Ability to meet basic needs , Home prepared for child    Psychotropic Medications:         Pediatrician:       Pediatrician List:   [place]    [place]    [place]    [place]    [place]    [place]      Pediatrician Fax Number:    Risk Factors/Current Problems:  Substance Use    Cognitive State:  Alert , Able to Concentrate , Linear Thinking , Insightful , Goal Oriented    Mood/Affect:  Bright , Calm , Happy , Comfortable , Interested    CSW Assessment: CSW met with MOB to complete an assessment for hx of substance use.  When CSW arrived, MOB was attaching and bonding with infant as  evident by MOB engaging in skin to skin.  MOB was inviting and interested in meeting with CSW.  CSW inquired about MOB's substance use and MOB acknowledged the use of marijuana during pregnancy.  MOB denied the use of all other illicit substance.  MOB reported MOB's last use of marijuana was 2 weeks ago.  MOB disclosed MOB smoke marijuana in effort to assist with increasing with MOB's appetite. CSW offered MOB SA resources and MOB declined.  CSW informed MOB of the hospital's policy and procedures regarding perinatal SA. MOB was made aware of the 2 drug screenings for the infant.  MOB was understanding and did not have any quesitons. CSW informed MOB that the infant's UDS is negative and CSW will continue to monitor infant's CDS.  MOB explained to MOB if infant's CDS is positive without and explanation, CSW are will make a report to [place] CPS. MOB denied having any questions about hospital's policy at this time.  MOB stated that MOB was familiar with CPS process because MOB smoked marijuana with MOB's oldest child.    CSW Plan/Description:  Sudden Infant Death Syndrome (SIDS) Education, No Further Intervention Required/No Barriers to Discharge, Hospital Drug Screen Policy Information, Other Patient/Family Education, Perinatal Mood and Anxiety Disorder (PMADs) Education, Other Information/Referral to Community Resources   Simcha Farrington Boyd-Gilyard, MSW, LCSW Clinical Social Work [phone]  Marijane Trower D BOYD-GILYARD, LCSW 03/22/2017, 3:30 PM 

## 2017-03-22 NOTE — Plan of Care (Signed)
Problem: Activity: Goal: Ability to tolerate increased activity will improve Outcome: Progressing Pt ambulates in room without difficulty, tolerates well.  Problem: Education: Goal: Knowledge of condition will improve Outcome: Progressing Verified per mom that she will be formula feeding.  Pt aware to keep bladder empty to help with vaginal bleeding.  Problem: Coping: Goal: Ability to identify and utilize available resources and services will improve Outcome: Progressing Pt met with social worker this shift & has no ques/concerns at this time.  Problem: Life Cycle: Goal: Risk for postpartum hemorrhage will decrease Outcome: Progressing Fundas has been firm, bleeding WNL & without clots.  Pt had one episode of intense cramping that she rated as 5 on 0-10 pain scale & states she felt a gush of blood.  Bleeding noted to be WNL, without clots at that time & fundas was firm at umbilicus.  Pt educated on how to palpate uterus & to notify RN if additional episodes.   Problem: Pain Management: Goal: General experience of comfort will improve and pain level will decrease Outcome: Progressing Pt pain has been managed adequately with scheduled Motrin.  Was given one does of Tylenol in which pain was improved upon reassessment.  Pt aware to notify RN if additional pain meds needed.

## 2017-03-22 NOTE — Discharge Summary (Signed)
OB Discharge Summary     Patient Name: Courtney Zhang DOB: 09/20/1995 MRN: [id number] Date of admission: 03/21/2017  Delivering MD: Marylene Land )  Date of discharge: 03/22/2017    Admitting diagnosis: pregnancy at [redacted] weeks gestation, SROM Intrauterine pregnancy: [redacted]w[redacted]d    Secondary diagnosis:  Active Problems:   Patient Active Problem List   Diagnosis Date Noted  . Pregnancy 03/21/2017  . Previous cesarean delivery affecting pregnancy hesitant to TOLAC. 03/06/2017  . Supervision of normal pregnancy 02/17/2017  . PCO (polycystic ovaries) 08/02/2014  . Pelvic pain in female 07/14/2014  . Amenorrhea 07/14/2014    Additional problems: none     Discharge diagnosis: Term Pregnancy Delivered and VBAC                                                                                                Post partum procedures:none  Augmentation: Pitocin  Complications: None  Hospital course:  Onset of Labor With Vaginal Delivery     21 y.o. yo [id number] at [redacted]w[redacted]d was admitted in Latent Labor on 03/21/2017. Patient had an uncomplicated labor course as follows:  Membrane Rupture Time/Date: 12:00 AM ,03/21/2017   Intrapartum Procedures: Episiotomy: None [1]                                         Lacerations:  1st degree [2];Labial [10];Periurethral [8]  Patient had a delivery of a Viable infant. 03/21/2017  Information for the patient's newborn:  Bellarose, Burtt [[id number]]  Delivery Method: Vag-Spont    Pateint had an uncomplicated postpartum course.  She is ambulating, tolerating a regular diet, passing flatus, and urinating well. Patient is discharged home in stable condition on 03/22/17.   Physical exam  Vitals:   03/22/17 1020 03/22/17 1805  BP: 108/65 118/68  Pulse: 93 79  Resp: 18 18  Temp: 98.1 F (36.7 C) 98.3 F (36.8 C)  SpO2:      General: alert, cooperative and no distress Lochia: appropriate Uterine Fundus: firm Incision: N/A DVT Evaluation:  No evidence of DVT seen on physical exam.  Labs: Results for orders placed or performed during the hospital encounter of 03/21/17 (from the past 24 hour(s))  CBC     Status: Abnormal   Collection Time: 03/22/17  5:58 AM  Result Value Ref Range   WBC 13.9 (H) 4.0 - 10.5 K/uL   RBC 3.81 (L) 3.87 - 5.11 MIL/uL   Hemoglobin 10.3 (L) 12.0 - 15.0 g/dL   HCT 96.2 (L) 95.2 - 84.1 %   MCV 83.2 78.0 - 100.0 fL   MCH 27.0 26.0 - 34.0 pg   MCHC 32.5 30.0 - 36.0 g/dL   RDW 32.4 40.1 - 02.7 %   Platelets 184 150 - 400 K/uL     Discharge instruction: per After Visit Summary and "Baby and Me Booklet".  After visit meds:  No Known Allergies  Allergies as of 03/23/2017   No Known Allergies     Medication List    STOP  taking these medications   dicyclomine 20 MG tablet Commonly known as:  BENTYL   famotidine 20 MG tablet Commonly known as:  PEPCID   metoCLOPramide 10 MG tablet Commonly known as:  REGLAN     TAKE these medications   ibuprofen 600 MG tablet Commonly known as:  ADVIL,MOTRIN Take 1 tablet (600 mg total) by mouth every 6 (six) hours.   prenatal vitamin w/FE, FA 27-1 MG Tabs tablet Take 1 tablet by mouth daily at 12 noon.        Diet: routine diet  Activity: Advance as tolerated. Pelvic rest for 6 weeks.   Outpatient follow up:4 weeks Future Appointments: Future Appointments Date Time Provider Department Center  03/23/2017 9:45 AM [facility] PAT 5 [facility] None    Postpartum contraception: Nexplanon  Newborn Data: APGAR (1 MIN): 8   APGAR (5 MINS): 9      Baby Feeding: Bottle Disposition:home with mother  Rolm Bookbinder, DO  03/22/2017

## 2017-03-23 ENCOUNTER — Encounter (HOSPITAL_COMMUNITY)
Admission: RE | Admit: 2017-03-23 | Discharge: 2017-03-23 | Disposition: A | Discharge status: Home / Self Care | Payer: Medicaid Other | Source: Ambulatory Visit | Attending: Obstetrics and Gynecology | Admitting: Obstetrics and Gynecology

## 2017-03-23 LAB — BIRTH TISSUE RECOVERY COLLECTION (PLACENTA DONATION)

## 2017-03-23 MED ORDER — IBUPROFEN 600 MG PO TABS: 600.0000 mg | ORAL_TABLET | Freq: Four times a day (QID) | ORAL | 0 refills | Status: DC

## 2017-04-03 ENCOUNTER — Telehealth: Payer: Self-pay | Admitting: Advanced Practice Midwife

## 2017-04-03 MED ORDER — PRENATAL PLUS 27-1 MG PO TABS: 1.0000 | ORAL_TABLET | Freq: Every day | ORAL | 11 refills | Status: DC

## 2017-04-03 NOTE — Telephone Encounter (Signed)
Informed patient prescription was refilled and sent to pharmacy.  

## 2017-05-06 ENCOUNTER — Encounter: Payer: Self-pay | Admitting: Advanced Practice Midwife

## 2017-05-06 ENCOUNTER — Ambulatory Visit (INDEPENDENT_AMBULATORY_CARE_PROVIDER_SITE_OTHER): Payer: Medicaid Other | Admitting: Advanced Practice Midwife

## 2017-05-06 MED ORDER — NORGESTIMATE-ETH ESTRADIOL 0.25-35 MG-MCG PO TABS: 1.0000 | ORAL_TABLET | Freq: Every day | ORAL | 11 refills | Status: DC

## 2017-05-06 NOTE — Patient Instructions (Signed)
Oral Contraception Information Oral contraceptive pills (OCPs) are medicines taken to prevent pregnancy. OCPs work by preventing the ovaries from releasing eggs. The hormones in OCPs also cause the cervical mucus to thicken, preventing the sperm from entering the uterus. The hormones also cause the uterine lining to become thin, not allowing a fertilized egg to attach to the inside of the uterus. OCPs are highly effective when taken exactly as prescribed. However, OCPs do not prevent sexually transmitted diseases (STDs). Safe sex practices, such as using condoms along with the pill, can help prevent STDs. Before taking the pill, you may have a physical exam and Pap test. Your health care provider may order blood tests. The health care provider will make sure you are a good candidate for oral contraception. Discuss with your health care provider the possible side effects of the OCP you may be prescribed. When starting an OCP, it can take 2 to 3 months for the body to adjust to the changes in hormone levels in your body. Types of oral contraception  The combination pill-This pill contains estrogen and progestin (synthetic progesterone) hormones. The combination pill comes in 21-day, 28-day, or 91-day packs. Some types of combination pills are meant to be taken continuously (365-day pills). With 21-day packs, you do not take pills for 7 days after the last pill. With 28-day packs, the pill is taken every day. The last 7 pills are without hormones. Certain types of pills have more than 21 hormone-containing pills. With 91-day packs, the first 84 pills contain both hormones, and the last 7 pills contain no hormones or contain estrogen only.  The minipill-This pill contains the progesterone hormone only. The pill is taken every day continuously. It is very important to take the pill at the same time each day. The minipill comes in packs of 28 pills. All 28 pills contain the hormone. Advantages of oral  contraceptive pills  Decreases premenstrual symptoms.  Treats menstrual period cramps.  Regulates the menstrual cycle.  Decreases a heavy menstrual flow.  May treatacne, depending on the type of pill.  Treats abnormal uterine bleeding.  Treats polycystic ovarian syndrome.  Treats endometriosis.  Can be used as emergency contraception. Things that can make oral contraceptive pills less effective OCPs can be less effective if:  You forget to take the pill at the same time every day.  You have a stomach or intestinal disease that lessens the absorption of the pill.  You take OCPs with other medicines that make OCPs less effective, such as antibiotics, certain HIV medicines, and some seizure medicines.  You take expired OCPs.  You forget to restart the pill on day 7, when using the packs of 21 pills.  Risks associated with oral contraceptive pills Oral contraceptive pills can sometimes cause side effects, such as:  Headache.  Nausea.  Breast tenderness.  Irregular bleeding or spotting.  Combination pills are also associated with a small increased risk of:  Blood clots.  Heart attack.  Stroke.  This information is not intended to replace advice given to you by your health care provider. Make sure you discuss any questions you have with your health care provider. Document Released: 08/16/2002 Document Revised: 11/01/2015 Document Reviewed: 11/14/2012 Elsevier Interactive Patient Education  2018 [employer] Inc[employer]  

## 2017-05-06 NOTE — Progress Notes (Signed)
Courtney Zhang is a 21 y.o. who presents for a postpartum visit. She is 6 weeks postpartum following a spontaneous vaginal delivery. I have fully reviewed the prenatal and intrapartum course. The delivery was at 39.2 gestational weeks.  Anesthesia: epidural. VBAC Postpartum course has been uneventful. Baby's course has been uneventful. Baby is feeding by bottle. Bleeding: no bleeding. Bowel function is normal. Bladder function is normal. Patient is sexually active. Contraception method is none. Postpartum depression screening: negative.   Current Outpatient Medications:  .  prenatal vitamin w/FE, FA (PRENATAL 1 + 1) 27-1 MG TABS tablet, Take 1 tablet by mouth daily at 12 noon., Disp: 30 each, Rfl: 11 .  ibuprofen (ADVIL,MOTRIN) 600 MG tablet, Take 1 tablet (600 mg total) by mouth every 6 (six) hours. (Patient not taking: Reported on 05/06/2017), Disp: 30 tablet, Rfl: 0  Review of Systems   Constitutional: Negative for fever and chills Eyes: Negative for visual disturbances Respiratory: Negative for shortness of breath, dyspnea Cardiovascular: Negative for chest pain or palpitations  Gastrointestinal: Negative for vomiting, diarrhea and constipation Genitourinary: Negative for dysuria and urgency Musculoskeletal: Negative for back pain, joint pain, myalgias  Neurological: Negative for dizziness and headaches    Objective:     Vitals:   05/06/17 1142  BP: 122/60  Pulse: 80   General:  alert, cooperative and no distress   Breasts:  negative  Lungs: clear to auscultation bilaterally  Heart:  regular rate and rhythm  Abdomen: Soft, nontender   Vulva:  normal  Vagina: normal vagina  Cervix:  closed  Corpus: Well involuted     Rectal Exam: no hemorrhoids        Assessment:    normal postpartum exam.  Plan:   1. Contraception: OCP (estrogen/progesterone) 2. Follow up in:   or as needed.  3. Take UPT in 2 weeks (had unprotected sex 4 days ago.  Talked about Plan B)

## 2017-06-04 ENCOUNTER — Encounter: Payer: Self-pay | Admitting: Advanced Practice Midwife

## 2017-06-04 ENCOUNTER — Other Ambulatory Visit: Payer: Self-pay

## 2017-06-04 ENCOUNTER — Ambulatory Visit (INDEPENDENT_AMBULATORY_CARE_PROVIDER_SITE_OTHER): Payer: Medicaid Other | Admitting: Advanced Practice Midwife

## 2017-06-04 VITALS — BP 124/86 | HR 105 | Ht 68.0 in | Wt 209.0 lb

## 2017-06-04 DIAGNOSIS — Z3202 Encounter for pregnancy test, result negative: Secondary | ICD-10-CM | POA: Diagnosis not present

## 2017-06-04 DIAGNOSIS — B379 Candidiasis, unspecified: Secondary | ICD-10-CM

## 2017-06-04 DIAGNOSIS — Z113 Encounter for screening for infections with a predominantly sexual mode of transmission: Secondary | ICD-10-CM | POA: Diagnosis not present

## 2017-06-04 LAB — POCT URINE PREGNANCY: Preg Test, Ur: NEGATIVE

## 2017-06-04 MED ORDER — FLUCONAZOLE 150 MG PO TABS: ORAL_TABLET | ORAL | 2 refills | Status: DC

## 2017-06-04 NOTE — Progress Notes (Signed)
CHIEF COMPLAINT/HPI:  21 y.o. female complains of white and thick vaginal discharge for a few  day(s). Denies abnormal vaginal bleeding, significant pelvic pain or fever.  some itch, and irritation, no odor. No UTI symptoms.   Denies history of known exposure to STD or symptoms in partner.  Patient's last menstrual period was 05/09/2017.  Has not tried an OTC product.  Want GC/CHL testing too Review of Systems  Constitutional: Negative for fever and chills Eyes: Negative for visual disturbances Respiratory: Negative for shortness of breath, dyspnea Cardiovascular: Negative for chest pain or palpitations  Gastrointestinal: Negative for vomiting, diarrhea and constipation Genitourinary: Negative for dysuria and urgency Musculoskeletal: Negative for back pain, joint pain, myalgias  Neurological: Negative for dizziness and headaches    Past Medical History: Past Medical History:  Diagnosis Date  . Abdominal cramping 07/14/2014  . Amenorrhea 07/14/2014  . Chlamydia   . Contraceptive education 11/07/2013  . GERD (gastroesophageal reflux disease)   . PCO (polycystic ovaries) 08/02/2014  . Pelvic pain in female 07/14/2014  . Positive pregnancy test 08/02/2014    Past Surgical History: Past Surgical History:  Procedure Laterality Date  . CESAREAN SECTION    . CHOLECYSTECTOMY  10/01/2016  . DILATION AND CURETTAGE OF UTERUS  12/13/2011?    Obstetrical History: OB History    Gravida Para Term Preterm AB Living   3 2 2   1 2    SAB TAB Ectopic Multiple Live Births   1     0 2      Social History: Social History   Socioeconomic History  . Marital status: Single    Spouse name: None  . Number of children: None  . Years of education: None  . Highest education level: None  Social Needs  . Financial resource strain: None  . Food insecurity - worry: None  . Food insecurity - inability: None  . Transportation needs - medical: None  . Transportation needs - non-medical: None  Occupational  History  . None  Tobacco Use  . Smoking status: Never Smoker  . Smokeless tobacco: Never Used  Substance and Sexual Activity  . Alcohol use: No  . Drug use: Yes    Types: Marijuana    Comment: 2 times per week  . Sexual activity: Yes    Birth control/protection: None, Pill  Other Topics Concern  . None  Social History Narrative  . None    Family History: Family History  Problem Relation Age of Onset  . Asthma Brother   . Diabetes Maternal Grandmother   . Hypertension Maternal Grandmother   . Diabetes Maternal Grandfather   . Diabetes Paternal Grandmother   . Asthma Brother   . Asthma Brother   . Asthma Brother     Allergies: No Known Allergies      PHYSICAL EXAM: Physical Examination: General appearance - well appearing, and in no distress Mental status - alert, oriented to person, place, and time Chest:  Normal respiratory effort Heart - normal rate and regular rhythm Abdomen:  Soft, nontender Pelvic: SSE:  Thick white discharge, wet prep no trich, no clue, many wbc, few yeast Musculoskeletal:  Normal range of motion without pain Extremities:  No edema    Labs: Results for orders placed or performed in visit on 06/04/17 (from the past 24 hour(s))  POCT urine pregnancy   Collection Time: 06/04/17  3:21 PM  Result Value Ref Range   Preg Test, Ur Negative Negative     Assessment: Patient Active Problem  List   Diagnosis Date Noted  . Pregnancy 03/21/2017  . Previous cesarean delivery affecting pregnancy hesitant to TOLAC. 03/06/2017  . Supervision of normal pregnancy 02/17/2017  . PCO (polycystic ovaries) 08/02/2014  . Pelvic pain in female 07/14/2014  . Amenorrhea 07/14/2014    Plan:  Orders Placed This Encounter  Procedures  . POCT urine pregnancy   Yeast rx diflucan  CRESENZO-DISHMAN,Naz Denunzio 06/04/17 3:41 PM

## 2017-06-08 ENCOUNTER — Telehealth: Payer: Self-pay | Admitting: *Deleted

## 2017-06-08 NOTE — Telephone Encounter (Signed)
Informed patient results were not back yet. States she will call back Wednesday.

## 2017-06-09 LAB — GC/CHLAMYDIA PROBE AMP
Chlamydia trachomatis, NAA: NEGATIVE
NEISSERIA GONORRHOEAE BY PCR: NEGATIVE

## 2017-06-10 ENCOUNTER — Telehealth: Payer: Self-pay | Admitting: *Deleted

## 2017-06-10 NOTE — Telephone Encounter (Signed)
Informed patient Gc/Chl was negative.

## 2017-06-25 ENCOUNTER — Ambulatory Visit: Payer: Medicaid Other | Admitting: Advanced Practice Midwife

## 2017-06-25 ENCOUNTER — Encounter: Payer: Self-pay | Admitting: Advanced Practice Midwife

## 2017-06-25 VITALS — BP 126/86 | HR 80 | Wt 216.0 lb

## 2017-06-25 DIAGNOSIS — N760 Acute vaginitis: Secondary | ICD-10-CM | POA: Diagnosis not present

## 2017-06-25 DIAGNOSIS — N898 Other specified noninflammatory disorders of vagina: Secondary | ICD-10-CM

## 2017-06-25 DIAGNOSIS — B9689 Other specified bacterial agents as the cause of diseases classified elsewhere: Secondary | ICD-10-CM | POA: Diagnosis not present

## 2017-06-25 MED ORDER — METRONIDAZOLE 500 MG PO TABS: 500.0000 mg | ORAL_TABLET | Freq: Two times a day (BID) | ORAL | 0 refills | Status: DC

## 2017-06-25 NOTE — Progress Notes (Signed)
[facility] Clinic Visit  Patient name: Yulitza Shorts MRN [id number]  Date of birth: 22-May-1996  CC & HPI:  Breeonna Mone is a 22 y.o. African American female presenting today for c/o fishy odor and a little itching for a week or so. Had visit for yeast a m onth ago, took diflucan. Said that cleard up, this is different.     Pertinent History Reviewed:  Medical & Surgical Hx:   Past Medical History:  Diagnosis Date  . Abdominal cramping 07/14/2014  . Amenorrhea 07/14/2014  . Chlamydia   . Contraceptive education 11/07/2013  . GERD (gastroesophageal reflux disease)   . PCO (polycystic ovaries) 08/02/2014  . Pelvic pain in female 07/14/2014  . Positive pregnancy test 08/02/2014   Past Surgical History:  Procedure Laterality Date  . CESAREAN SECTION    . CHOLECYSTECTOMY  10/01/2016  . DILATION AND CURETTAGE OF UTERUS  12/13/2011?   Family History  Problem Relation Age of Onset  . Asthma Brother   . Diabetes Maternal Grandmother   . Hypertension Maternal Grandmother   . Diabetes Maternal Grandfather   . Diabetes Paternal Grandmother   . Asthma Brother   . Asthma Brother   . Asthma Brother     Current Outpatient Medications:  .  norgestimate-ethinyl estradiol (ORTHO-CYCLEN,SPRINTEC,PREVIFEM) 0.25-35 MG-MCG tablet, Take 1 tablet by mouth daily., Disp: 1 Package, Rfl: 11 .  ibuprofen (ADVIL,MOTRIN) 600 MG tablet, Take 1 tablet (600 mg total) by mouth every 6 (six) hours. (Patient not taking: Reported on 06/25/2017), Disp: 30 tablet, Rfl: 0 .  metroNIDAZOLE (FLAGYL) 500 MG tablet, Take 1 tablet (500 mg total) by mouth 2 (two) times daily., Disp: 14 tablet, Rfl: 0 .  prenatal vitamin w/FE, FA (PRENATAL 1 + 1) 27-1 MG TABS tablet, Take 1 tablet by mouth daily at 12 noon. (Patient not taking: Reported on 06/25/2017), Disp: 30 each, Rfl: 11 Social History: Reviewed -  reports that  has never smoked. she has never used smokeless tobacco.  Review of Systems:   Constitutional: Negative for  fever and chills Eyes: Negative for visual disturbances Respiratory: Negative for shortness of breath, dyspnea Cardiovascular: Negative for chest pain or palpitations  Gastrointestinal: Negative for vomiting, diarrhea and constipation; no abdominal pain Genitourinary: Negative for dysuria and urgency, vaginal irritation or itching Musculoskeletal: Negative for back pain, joint pain, myalgias  Neurological: Negative for dizziness and headaches    Objective Findings:    Physical Examination: General appearance - well appearing, and in no distress Mental status - alert, oriented to person, place, and time Chest:  Normal respiratory effort Heart - normal rate and regular rhythm Abdomen:  Soft, nontender Pelvic: small amount of thin white DC with sl fishy odor. Wet prep clue, no trich or wbc  nuswab sent d/t just being here a few weeks ago.  Musculoskeletal:  Normal range of motion without pain Extremities:  No edema    No results found for this or any previous visit (from the past 24 hour(s)).    Assessment & Plan:  A:   BV P:   Orders Placed This Encounter  Procedures  . NuSwab Vaginitis Plus (VG+)   Meds ordered this encounter  Medications  . metroNIDAZOLE (FLAGYL) 500 MG tablet    Sig: Take 1 tablet (500 mg total) by mouth 2 (two) times daily.    Dispense:  14 tablet    Refill:  0    Order Specific Question:   Supervising Provider    Answer:   Despina Hidden,  LUTHER H [[id number]]      Return if symptoms worsen or fail to improve.  CRESENZO-DISHMAN,Mykeal Carrick CNM 06/25/2017 2:34 PM

## 2017-06-28 LAB — NUSWAB VAGINITIS PLUS (VG+)
ATOPOBIUM VAGINAE: HIGH {score} — AB
BVAB 2: HIGH Score — AB
CANDIDA GLABRATA, NAA: NEGATIVE
Candida albicans, NAA: NEGATIVE
Chlamydia trachomatis, NAA: NEGATIVE
Megasphaera 1: HIGH Score — AB
NEISSERIA GONORRHOEAE, NAA: NEGATIVE
TRICH VAG BY NAA: NEGATIVE

## 2017-07-01 ENCOUNTER — Other Ambulatory Visit: Payer: Self-pay

## 2017-07-01 ENCOUNTER — Emergency Department (HOSPITAL_COMMUNITY)
Admission: EM | Admit: 2017-07-01 | Discharge: 2017-07-01 | Disposition: A | Discharge status: Home / Self Care | Payer: Medicaid Other | Attending: Emergency Medicine | Admitting: Emergency Medicine

## 2017-07-01 ENCOUNTER — Encounter (HOSPITAL_COMMUNITY): Payer: Self-pay | Admitting: Emergency Medicine

## 2017-07-01 DIAGNOSIS — R21 Rash and other nonspecific skin eruption: Secondary | ICD-10-CM | POA: Diagnosis present

## 2017-07-01 DIAGNOSIS — Z79899 Other long term (current) drug therapy: Secondary | ICD-10-CM | POA: Insufficient documentation

## 2017-07-01 DIAGNOSIS — L293 Anogenital pruritus, unspecified: Secondary | ICD-10-CM

## 2017-07-01 MED ORDER — DIPHENHYDRAMINE-ZINC ACETATE 2-0.1 % EX CREA: 1.0000 "application " | TOPICAL_CREAM | Freq: Three times a day (TID) | CUTANEOUS | 0 refills | Status: DC | PRN

## 2017-07-01 NOTE — ED Provider Notes (Signed)
[facility] EMERGENCY DEPARTMENT Provider Note   CSN: [id number] Arrival date & time: 07/01/17  1658     History   Chief Complaint Chief Complaint  Patient presents with  . Vaginal Itching    HPI Courtney Zhang is a 22 y.o. female presenting with a small area of itching along her right labia majora which started yesterday today.  When she looked at the site she noticed it appearing raised and gray in color.  She was seen by her gyn 5 days ago for vaginal discharge and was diagnosed with bacterial vaginosis, currently finishing flagyl for this infection.  She denies pain at the site, continued vaginal discharge, abdominal or pelvic pain, n/v ,dysuria or fever. She is sexually active in a monogamous relationship.   The history is provided by the patient.    Past Medical History:  Diagnosis Date  . Abdominal cramping 07/14/2014  . Amenorrhea 07/14/2014  . Chlamydia   . Contraceptive education 11/07/2013  . GERD (gastroesophageal reflux disease)   . PCO (polycystic ovaries) 08/02/2014  . Pelvic pain in female 07/14/2014  . Positive pregnancy test 08/02/2014    Patient Active Problem List   Diagnosis Date Noted  . Pregnancy 03/21/2017  . Previous cesarean delivery affecting pregnancy hesitant to TOLAC. 03/06/2017  . Supervision of normal pregnancy 02/17/2017  . PCO (polycystic ovaries) 08/02/2014  . Pelvic pain in female 07/14/2014  . Amenorrhea 07/14/2014    Past Surgical History:  Procedure Laterality Date  . CESAREAN SECTION    . CHOLECYSTECTOMY  10/01/2016  . DILATION AND CURETTAGE OF UTERUS  12/13/2011?    OB History    Gravida Para Term Preterm AB Living   3 2 2   1 2    SAB TAB Ectopic Multiple Live Births   1     0 2       Home Medications    Prior to Admission medications   Medication Sig Start Date End Date Taking? Authorizing Provider  diphenhydrAMINE-zinc acetate (BENADRYL EXTRA STRENGTH) cream Apply 1 application topically 3 (three) times daily as needed  for itching. 07/01/17   Burgess Amor, PA-C  ibuprofen (ADVIL,MOTRIN) 600 MG tablet Take 1 tablet (600 mg total) by mouth every 6 (six) hours. Patient not taking: Reported on 06/25/2017 03/23/17   Rolm Bookbinder, DO  metroNIDAZOLE (FLAGYL) 500 MG tablet Take 1 tablet (500 mg total) by mouth 2 (two) times daily. 06/25/17   Cresenzo-Dishmon, Scarlette Calico, CNM  norgestimate-ethinyl estradiol (ORTHO-CYCLEN,SPRINTEC,PREVIFEM) 0.25-35 MG-MCG tablet Take 1 tablet by mouth daily. 05/06/17   Cresenzo-Dishmon, Scarlette Calico, CNM  prenatal vitamin w/FE, FA (PRENATAL 1 + 1) 27-1 MG TABS tablet Take 1 tablet by mouth daily at 12 noon. Patient not taking: Reported on 06/25/2017 04/03/17   Cheral Marker, CNM    Family History Family History  Problem Relation Age of Onset  . Asthma Brother   . Diabetes Maternal Grandmother   . Hypertension Maternal Grandmother   . Diabetes Maternal Grandfather   . Diabetes Paternal Grandmother   . Asthma Brother   . Asthma Brother   . Asthma Brother     Social History Social History   Tobacco Use  . Smoking status: Never Smoker  . Smokeless tobacco: Never Used  Substance Use Topics  . Alcohol use: No  . Drug use: Yes    Types: Marijuana    Comment: 2 times per week     Allergies   Patient has no known allergies.   Review of Systems Review of Systems  Constitutional: Negative for chills and fever.  HENT: Negative.   Eyes: Negative.   Respiratory: Negative.   Cardiovascular: Negative.   Gastrointestinal: Negative for abdominal pain, nausea and vomiting.  Genitourinary: Negative.  Negative for pelvic pain.       Negative except as mentioned in HPI.   Musculoskeletal: Negative for arthralgias.  Skin: Negative.  Negative for rash and wound.  Neurological: Negative.   Psychiatric/Behavioral: Negative.      Physical Exam Updated Vital Signs BP 123/90 (BP Location: Right Arm)   Pulse (!) 107   Temp 98.7 F (37.1 C) (Oral)   Resp 18   Ht 5\' 8"  (1.727 m)    Wt 98 kg (216 lb)   LMP 06/10/2017   SpO2 100%   Breastfeeding? No   BMI 32.84 kg/m   Physical Exam  Constitutional: She appears well-developed and well-nourished.  HENT:  Head: Normocephalic and atraumatic.  Eyes: Conjunctivae are normal.  Neck: Normal range of motion.  Cardiovascular: Normal rate.  Pulmonary/Chest: Effort normal.  Abdominal: Soft. Bowel sounds are normal. There is no tenderness.  Genitourinary: There is rash on the right labia. There is no tenderness, lesion or injury on the right labia.  Genitourinary Comments: 1 cm macular sandpaper like abrasion right labia majora.  No erythema, no induration or fluctuance. No vesicles, no drainage. Pelvic exam deferred.  Musculoskeletal: Normal range of motion.  Neurological: She is alert.  Skin: Skin is warm and dry.  Psychiatric: She has a normal mood and affect.  Nursing note and vitals reviewed.    ED Treatments / Results  Labs (all labs ordered are listed, but only abnormal results are displayed) Labs Reviewed - No data to display  EKG  EKG Interpretation None       Radiology No results found.  Procedures Procedures (including critical care time)  Medications Ordered in ED Medications - No data to display   Initial Impression / Assessment and Plan / ED Course  I have reviewed the triage vital signs and the nursing notes.  Pertinent labs & imaging results that were available during my care of the patient were reviewed by me and considered in my medical decision making (see chart for details).     Review of chart indicating [facility] positive only for BV.  STD screening not indicated at todays exam.  Pt was most concerned about possible herpes as source of this lesion, I favor localized contact irritation esp since no vesicles, erythema or pain.  Advised topical benadryl for itching, prn f/u with her gyn for any persistent or worsened sx, and to finish her  flagyl.  Final Clinical Impressions(s) / ED Diagnoses   Final diagnoses:  Perineal itching, female    ED Discharge Orders        Ordered    diphenhydrAMINE-zinc acetate (BENADRYL EXTRA STRENGTH) cream  3 times daily PRN     07/01/17 1834       Victoriano Laindol, Eliana Lueth, PA-C 07/01/17 2011    Eber HongMiller, Brian, MD 07/03/17 1041

## 2017-07-01 NOTE — ED Triage Notes (Signed)
Pt reports small "gray circular area" to genitals with intermittent itching. Pt denies odor, discharge. Pt reports "I had sex awhile ago and we used a condom".

## 2017-07-01 NOTE — Discharge Instructions (Signed)
Finish your flagyl as discussed.  Plan to see Drenda FreezeFran if you continue to have itching or any new changes

## 2017-07-23 ENCOUNTER — Other Ambulatory Visit: Payer: Self-pay | Admitting: Advanced Practice Midwife

## 2017-09-07 ENCOUNTER — Ambulatory Visit: Payer: Medicaid Other | Admitting: Adult Health

## 2017-09-17 ENCOUNTER — Ambulatory Visit: Payer: Medicaid Other | Admitting: Adult Health

## 2017-10-07 ENCOUNTER — Encounter: Payer: Self-pay | Admitting: Adult Health

## 2017-10-07 ENCOUNTER — Other Ambulatory Visit: Payer: Self-pay | Admitting: Adult Health

## 2017-10-07 ENCOUNTER — Ambulatory Visit: Payer: Medicaid Other | Admitting: Adult Health

## 2017-10-07 VITALS — BP 122/66 | HR 84 | Ht 68.0 in | Wt 226.0 lb

## 2017-10-07 DIAGNOSIS — Z113 Encounter for screening for infections with a predominantly sexual mode of transmission: Secondary | ICD-10-CM

## 2017-10-07 DIAGNOSIS — N898 Other specified noninflammatory disorders of vagina: Secondary | ICD-10-CM | POA: Diagnosis not present

## 2017-10-07 DIAGNOSIS — R300 Dysuria: Secondary | ICD-10-CM | POA: Diagnosis not present

## 2017-10-07 DIAGNOSIS — R109 Unspecified abdominal pain: Secondary | ICD-10-CM

## 2017-10-07 DIAGNOSIS — N760 Acute vaginitis: Secondary | ICD-10-CM

## 2017-10-07 DIAGNOSIS — B9689 Other specified bacterial agents as the cause of diseases classified elsewhere: Secondary | ICD-10-CM

## 2017-10-07 LAB — POCT URINALYSIS DIPSTICK
Blood, UA: NEGATIVE
GLUCOSE UA: NEGATIVE
Leukocytes, UA: NEGATIVE
NITRITE UA: NEGATIVE

## 2017-10-07 LAB — POCT WET PREP (WET MOUNT): Clue Cells Wet Prep Whiff POC: NEGATIVE

## 2017-10-07 MED ORDER — METRONIDAZOLE 500 MG PO TABS: 500.0000 mg | ORAL_TABLET | Freq: Two times a day (BID) | ORAL | 1 refills | Status: DC

## 2017-10-07 NOTE — Patient Instructions (Signed)
Bacterial Vaginosis Bacterial vaginosis is a vaginal infection that occurs when the normal balance of bacteria in the vagina is disrupted. It results from an overgrowth of certain bacteria. This is the most common vaginal infection among women ages 15-44. Because bacterial vaginosis increases your risk for STIs (sexually transmitted infections), getting treated can help reduce your risk for chlamydia, gonorrhea, herpes, and HIV (human immunodeficiency virus). Treatment is also important for preventing complications in pregnant women, because this condition can cause an early (premature) delivery. What are the causes? This condition is caused by an increase in harmful bacteria that are normally present in small amounts in the vagina. However, the reason that the condition develops is not fully understood. What increases the risk? The following factors may make you more likely to develop this condition:  Having a new sexual partner or multiple sexual partners.  Having unprotected sex.  Douching.  Having an intrauterine device (IUD).  Smoking.  Drug and alcohol abuse.  Taking certain antibiotic medicines.  Being pregnant.  You cannot get bacterial vaginosis from toilet seats, bedding, swimming pools, or contact with objects around you. What are the signs or symptoms? Symptoms of this condition include:  Grey or white vaginal discharge. The discharge can also be watery or foamy.  A fish-like odor with discharge, especially after sexual intercourse or during menstruation.  Itching in and around the vagina.  Burning or pain with urination.  Some women with bacterial vaginosis have no signs or symptoms. How is this diagnosed? This condition is diagnosed based on:  Your medical history.  A physical exam of the vagina.  Testing a sample of vaginal fluid under a microscope to look for a large amount of bad bacteria or abnormal cells. Your health care provider may use a cotton swab  or a small wooden spatula to collect the sample.  How is this treated? This condition is treated with antibiotics. These may be given as a pill, a vaginal cream, or a medicine that is put into the vagina (suppository). If the condition comes back after treatment, a second round of antibiotics may be needed. Follow these instructions at home: Medicines  Take over-the-counter and prescription medicines only as told by your health care provider.  Take or use your antibiotic as told by your health care provider. Do not stop taking or using the antibiotic even if you start to feel better. General instructions  If you have a female sexual partner, tell her that you have a vaginal infection. She should see her health care provider and be treated if she has symptoms. If you have a female sexual partner, he does not need treatment.  During treatment: ? Avoid sexual activity until you finish treatment. ? Do not douche. ? Avoid alcohol as directed by your health care provider. ? Avoid breastfeeding as directed by your health care provider.  Drink enough water and fluids to keep your urine clear or pale yellow.  Keep the area around your vagina and rectum clean. ? Wash the area daily with warm water. ? Wipe yourself from front to back after using the toilet.  Keep all follow-up visits as told by your health care provider. This is important. How is this prevented?  Do not douche.  Wash the outside of your vagina with warm water only.  Use protection when having sex. This includes latex condoms and dental dams.  Limit how many sexual partners you have. To help prevent bacterial vaginosis, it is best to have sex with just   one partner (monogamous).  Make sure you and your sexual partner are tested for STIs.  Wear cotton or cotton-lined underwear.  Avoid wearing tight pants and pantyhose, especially during summer.  Limit the amount of alcohol that you drink.  Do not use any products that  contain nicotine or tobacco, such as cigarettes and e-cigarettes. If you need help quitting, ask your health care provider.  Do not use illegal drugs. Where to find more information:  Centers for Disease Control and Prevention: www.cdc.gov/std  American Sexual Health Association (ASHA): www.ashastd.org  U.S. Department of Health and Human Services, Office on Women's Health: www.womenshealth.gov/ or https://www.womenshealth.gov/a-z-topics/bacterial-vaginosis Contact a health care provider if:  Your symptoms do not improve, even after treatment.  You have more discharge or pain when urinating.  You have a fever.  You have pain in your abdomen.  You have pain during sex.  You have vaginal bleeding between periods. Summary  Bacterial vaginosis is a vaginal infection that occurs when the normal balance of bacteria in the vagina is disrupted.  Because bacterial vaginosis increases your risk for STIs (sexually transmitted infections), getting treated can help reduce your risk for chlamydia, gonorrhea, herpes, and HIV (human immunodeficiency virus). Treatment is also important for preventing complications in pregnant women, because the condition can cause an early (premature) delivery.  This condition is treated with antibiotic medicines. These may be given as a pill, a vaginal cream, or a medicine that is put into the vagina (suppository). This information is not intended to replace advice given to you by your health care provider. Make sure you discuss any questions you have with your health care provider. Document Released: 05/26/2005 Document Revised: 09/29/2016 Document Reviewed: 02/09/2016 Elsevier Interactive Patient Education  2018 Elsevier Inc.  

## 2017-10-07 NOTE — Progress Notes (Signed)
  Subjective:     Patient ID: Courtney Zhang, female   DOB: 25-Jul-1995, 22 y.o.   MRN: [id number]  HPI [name] is a 22 year old black female in complaining of vaginal discharge with cramps, and ?UTI. She is on OCs and happy with them and has had sex recently with new partner.   Review of Systems +vaginal discharge +cramps  +dysuria Reviewed past medical,surgical, social and family history. Reviewed medications and allergies.     Objective:   Physical Exam BP 122/66 (BP Location: Left Arm, Patient Position: Sitting, Cuff Size: Normal)   Pulse 84   Ht  (1.727 m)   Wt 226 lb (102.5 kg)   LMP 09/18/2017 (Approximate)   BMI 34.36 kg/m   Urine small ketones and trace protein.  Skin warm and dry.Pelvic: external genitalia is normal in appearance no lesions, vagina: white discharge without odor,urethra has no lesions or masses noted, cervix:smooth and bulbous, uterus: normal size, shape and contour, non tender, no masses felt, adnexa: no masses or tenderness noted. Bladder is non tender and no masses felt. Wet prep: + for clue cells and +WBCs. GC/CHL obtained.     Assessment:     1. Bacterial vaginosis   2. Vaginal discharge   3. Dysuria   4. Screening examination for STD (sexually transmitted disease)   5. Abdominal cramps       Plan:     Rx flagyl 500 mg 1 bid x 7 days,#14 with 1 refill, no alcohol, review handout on BV   GC/CHL sent F/U prn

## 2017-10-10 LAB — GC/CHLAMYDIA PROBE AMP
Chlamydia trachomatis, NAA: NEGATIVE
NEISSERIA GONORRHOEAE BY PCR: NEGATIVE

## 2017-10-20 ENCOUNTER — Telehealth: Payer: Self-pay | Admitting: *Deleted

## 2017-10-20 MED ORDER — FLUCONAZOLE 150 MG PO TABS: ORAL_TABLET | ORAL | 1 refills | Status: DC

## 2017-10-20 NOTE — Telephone Encounter (Signed)
Pt has yeast now, has finished flagyl

## 2017-10-23 ENCOUNTER — Ambulatory Visit: Payer: Medicaid Other | Admitting: Obstetrics and Gynecology

## 2017-10-27 ENCOUNTER — Ambulatory Visit: Payer: Medicaid Other | Admitting: Adult Health

## 2017-10-27 ENCOUNTER — Encounter: Payer: Self-pay | Admitting: Adult Health

## 2017-10-27 VITALS — BP 122/70 | HR 97 | Ht 68.0 in | Wt 230.0 lb

## 2017-10-27 DIAGNOSIS — Z3A01 Less than 8 weeks gestation of pregnancy: Secondary | ICD-10-CM

## 2017-10-27 DIAGNOSIS — N926 Irregular menstruation, unspecified: Secondary | ICD-10-CM | POA: Diagnosis not present

## 2017-10-27 DIAGNOSIS — Z3201 Encounter for pregnancy test, result positive: Secondary | ICD-10-CM | POA: Diagnosis not present

## 2017-10-27 DIAGNOSIS — O3680X0 Pregnancy with inconclusive fetal viability, not applicable or unspecified: Secondary | ICD-10-CM

## 2017-10-27 DIAGNOSIS — R110 Nausea: Secondary | ICD-10-CM | POA: Diagnosis not present

## 2017-10-27 LAB — POCT URINE PREGNANCY: Preg Test, Ur: POSITIVE — AB

## 2017-10-27 NOTE — Progress Notes (Signed)
  Subjective:     Patient ID: Courtney Zhang, female   DOB: [birth year], 22 y.o.   MRN: [id number]  HPI Courtney Zhang is a 22 year old black female in for UPT, has missed a period and had +HPT, she said she did miss some of her pills.She is thinking of termination, has 28 month old at home.   Review of Systems +missed period with +HPT +nausea  Reviewed past medical,surgical, social and family history. Reviewed medications and allergies.     Objective:   Physical Exam BP 122/70 (BP Location: Left Arm, Patient Position: Sitting, Cuff Size: Small)   Pulse 97   Ht  (1.727 m)   Wt 230 lb (104.3 kg)   LMP 09/18/2017   BMI 34.97 kg/m UPT +, about 5+4 weeks by LMP with EDD 06/25/18. Skin warm and dry. Neck: mid line trachea, normal thyroid, good ROM, no lymphadenopathy noted. Lungs: clear to ausculation bilaterally. Cardiovascular: regular rate and rhythm.Abdomen is soft and non tender. Will get [place] in 1 week, may is thinking of termination.     Assessment:     1. Pregnancy test positive   2. Less than [redacted] weeks gestation of pregnancy   3. Encounter to determine fetal viability of pregnancy, single or unspecified fetus       Plan:     Return in 1 week for dating [place] She is thinking of termination

## 2017-10-28 ENCOUNTER — Other Ambulatory Visit: Payer: Self-pay | Admitting: Obstetrics & Gynecology

## 2017-10-28 DIAGNOSIS — O3680X0 Pregnancy with inconclusive fetal viability, not applicable or unspecified: Secondary | ICD-10-CM

## 2017-10-29 ENCOUNTER — Other Ambulatory Visit: Payer: Self-pay | Admitting: Obstetrics & Gynecology

## 2017-10-29 ENCOUNTER — Ambulatory Visit (INDEPENDENT_AMBULATORY_CARE_PROVIDER_SITE_OTHER): Payer: Medicaid Other

## 2017-10-29 DIAGNOSIS — O3680X0 Pregnancy with inconclusive fetal viability, not applicable or unspecified: Secondary | ICD-10-CM

## 2017-10-29 DIAGNOSIS — Z3A01 Less than 8 weeks gestation of pregnancy: Secondary | ICD-10-CM | POA: Diagnosis not present

## 2017-10-29 NOTE — Progress Notes (Signed)
[place] TA/TV: No IUP visualized,normal ovaries bilat,EEC 11.3 mm,discussed results w/Jennifer

## 2017-11-17 ENCOUNTER — Ambulatory Visit (INDEPENDENT_AMBULATORY_CARE_PROVIDER_SITE_OTHER): Payer: Medicaid Other | Admitting: Obstetrics & Gynecology

## 2017-11-17 ENCOUNTER — Encounter: Payer: Self-pay | Admitting: Obstetrics & Gynecology

## 2017-11-17 VITALS — BP 132/76 | HR 107 | Ht 68.0 in | Wt 230.0 lb

## 2017-11-17 DIAGNOSIS — A6009 Herpesviral infection of other urogenital tract: Secondary | ICD-10-CM

## 2017-11-17 DIAGNOSIS — N898 Other specified noninflammatory disorders of vagina: Secondary | ICD-10-CM

## 2017-11-17 DIAGNOSIS — R300 Dysuria: Secondary | ICD-10-CM

## 2017-11-17 DIAGNOSIS — Z331 Pregnant state, incidental: Secondary | ICD-10-CM

## 2017-11-17 DIAGNOSIS — O98311 Other infections with a predominantly sexual mode of transmission complicating pregnancy, first trimester: Principal | ICD-10-CM

## 2017-11-17 LAB — POCT URINALYSIS DIPSTICK
Glucose, UA: NEGATIVE
Ketones, UA: NEGATIVE
LEUKOCYTES UA: NEGATIVE
Nitrite, UA: NEGATIVE
Protein, UA: NEGATIVE
RBC UA: NEGATIVE

## 2017-11-17 MED ORDER — ACYCLOVIR 400 MG PO TABS: 400.0000 mg | ORAL_TABLET | Freq: Every day | ORAL | 1 refills | Status: DC

## 2017-11-17 NOTE — Progress Notes (Signed)
Chief Complaint  Patient presents with  . Vaginal Discharge    burning with urination      22 y.o. [name] Patient's last menstrual period was 09/18/2017. The current method of family planning is none, pt is pregnant  Outpatient Encounter Medications as of 11/17/2017  Medication Sig  . [DISCONTINUED] fluconazole (DIFLUCAN) 150 MG tablet Take 1 now and 1 in 3 days (Patient not taking: Reported on 10/27/2017)  . [DISCONTINUED] metroNIDAZOLE (FLAGYL) 500 MG tablet Take 1 tablet (500 mg total) by mouth 2 (two) times daily. (Patient not taking: Reported on 10/27/2017)  . [DISCONTINUED] norgestimate-ethinyl estradiol (ORTHO-CYCLEN,SPRINTEC,PREVIFEM) 0.25-35 MG-MCG tablet Take 1 tablet by mouth daily. (Patient not taking: Reported on 10/27/2017)   No facility-administered encounter medications on file as of 11/17/2017.     Subjective Courtney Zhang presents complaining of vulvar burning for the past couple of days, noticed it initially when voiding but she is not itching but feels very irritated Some discharge but no odor, she states it does not seem like BV Feels a bit ill, like she has a virus for a couple of days Denies history of herpetic lesions or ooutbreaks in the past Her symptoms are limited to her vulva  Past Medical History:  Diagnosis Date  . Abdominal cramping 07/14/2014  . Amenorrhea 07/14/2014  . Chlamydia   . Contraceptive education 11/07/2013  . GERD (gastroesophageal reflux disease)   . PCO (polycystic ovaries) 08/02/2014  . Pelvic pain in female 07/14/2014  . Positive pregnancy test 08/02/2014    Past Surgical History:  Procedure Laterality Date  . CESAREAN SECTION    . CHOLECYSTECTOMY  10/01/2016  . DILATION AND CURETTAGE OF UTERUS  12/13/2011?    OB History    Gravida  4   Para  2   Term  2   Preterm      AB  1   Living  2     SAB  1   TAB      Ectopic      Multiple  0   Live Births  2           No Known Allergies  Social History    Socioeconomic History  . Marital status: Single    Spouse name: Not on file  . Number of children: Not on file  . Years of education: Not on file  . Highest education level: Not on file  Occupational History  . Not on file  Social Needs  . Financial resource strain: Not on file  . Food insecurity:    Worry: Not on file    Inability: Not on file  . Transportation needs:    Medical: Not on file    Non-medical: Not on file  Tobacco Use  . Smoking status: Never Smoker  . Smokeless tobacco: Never Used  Substance and Sexual Activity  . Alcohol use: No  . Drug use: Yes    Types: Marijuana    Comment: 2 times per week  . Sexual activity: Yes    Birth control/protection: Pill  Lifestyle  . Physical activity:    Days per week: Not on file    Minutes per session: Not on file  . Stress: Not on file  Relationships  . Social connections:    Talks on phone: Not on file    Gets together: Not on file    Attends religious service: Not on file    Active member of club or organization: Not on file  Attends meetings of clubs or organizations: Not on file    Relationship status: Not on file  Other Topics Concern  . Not on file  Social History Narrative  . Not on file    Family History  Problem Relation Age of Onset  . Asthma Brother   . Diabetes Maternal Grandmother   . Hypertension Maternal Grandmother   . Diabetes Maternal Grandfather   . Diabetes Paternal Grandmother   . Asthma Brother   . Asthma Brother   . Asthma Brother     Medications:      No current outpatient medications on file.  Objective Blood pressure 132/76, pulse (!) 107, height 5\' 8"  (1.727 m), weight 230 lb (104.3 kg), last menstrual period 09/18/2017, not currently breastfeeding.  General WDWN female NAD Vulva:  3 ulcerative lesions a the forchette, very tender, most consistent with HSV 1 Vagina:  normal mucosa, no discharge, no lesions or discharge noted Cervix:  no cervical motion tenderness and  no lesions Uterus:   Adnexa: ovaries:,     Pertinent ROS No burning with urination, frequency or urgency No nausea, vomiting or diarrhea Nor fever chills or other constitutional symptoms   Labs or studies HSV cultures, GC chlamydia and urine culture is sent and pending    Impression Diagnoses this Encounter::   ICD-10-CM   1. 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 Dysuria R30.0 POCT Urinalysis Dipstick    Urine Culture    GC/Chlamydia Probe Amp([facility])    Herpes simplex virus culture  3. Vaginal discharge N89.8 Urine Culture    GC/Chlamydia Probe Amp([facility])    Herpes simplex virus culture    Established relevant diagnosis(es): Early pregnancy  Plan/Recommendations: No orders of the defined types were placed in this encounter.   Labs or Scans Ordered: Orders Placed This Encounter  Procedures  . Urine Culture  . GC/Chlamydia Probe Amp([facility])  . Herpes simplex virus culture  . POCT Urinalysis Dipstick    Management:: Acyclovir 400 5 times daily for 10 days,   Follow up Return in about 2 weeks (around 12/01/2017) for Follow up, with Dr Despina HiddenEure.      All questions were answered.

## 2017-11-19 LAB — URINE CULTURE

## 2017-11-19 LAB — GC/CHLAMYDIA PROBE AMP
CHLAMYDIA, DNA PROBE: NEGATIVE
NEISSERIA GONORRHOEAE BY PCR: NEGATIVE

## 2017-11-20 ENCOUNTER — Telehealth: Payer: Self-pay | Admitting: *Deleted

## 2017-11-20 LAB — HERPES SIMPLEX VIRUS CULTURE

## 2017-11-20 NOTE — Telephone Encounter (Signed)
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   Advised to keep taking medication prescribed along with f/u appt with Dr Despina HiddenEure.

## 2017-12-01 ENCOUNTER — Ambulatory Visit: Payer: Medicaid Other | Admitting: Obstetrics & Gynecology

## 2017-12-05 HISTORY — PX: INDUCED ABORTION: SHX677

## 2017-12-09 ENCOUNTER — Other Ambulatory Visit: Payer: Self-pay

## 2017-12-09 ENCOUNTER — Encounter (HOSPITAL_COMMUNITY): Payer: Self-pay | Admitting: Emergency Medicine

## 2017-12-09 ENCOUNTER — Emergency Department (HOSPITAL_COMMUNITY)
Admission: EM | Admit: 2017-12-09 | Discharge: 2017-12-09 | Disposition: A | Discharge status: Home / Self Care | Payer: Medicaid Other | Attending: Emergency Medicine | Admitting: Emergency Medicine

## 2017-12-09 DIAGNOSIS — R21 Rash and other nonspecific skin eruption: Secondary | ICD-10-CM | POA: Diagnosis present

## 2017-12-09 DIAGNOSIS — Z79899 Other long term (current) drug therapy: Secondary | ICD-10-CM | POA: Diagnosis not present

## 2017-12-09 MED ORDER — ACYCLOVIR 5 % EX CREA: 1.0000 "application " | TOPICAL_CREAM | Freq: Four times a day (QID) | CUTANEOUS | 0 refills | Status: DC

## 2017-12-09 MED ORDER — SULFAMETHOXAZOLE-TRIMETHOPRIM 800-160 MG PO TABS: 1.0000 | ORAL_TABLET | Freq: Two times a day (BID) | ORAL | 0 refills | Status: AC

## 2017-12-09 NOTE — ED Provider Notes (Signed)
[facility] EMERGENCY DEPARTMENT Provider Note   CSN: [id number] Arrival date & time: 12/09/17  1049     History   Chief Complaint Chief Complaint  Patient presents with  . Skin Problem    HPI Courtney Zhang is a 22 y.o. female presents today for evaluation of acute onset, per aggressively worsening rash to the dorsum of the right hand.  She states that approximately 1 week ago she had "a boil "to the lateral aspect of the right hand which she states spontaneously resolved.  Shortly thereafter she noticed a recurrence of rash and it has since worsened.  She describes multiple bumps that have grown larger.  She denies fevers, nausea, vomiting, diaphoresis.  She states that it is pruritic but also painful.  Pain does not radiate.  Pain worsens with palpation.  She has applied topical Neosporin without significant relief of her symptoms.  Denies any known insect bites.  The history is provided by the patient.    Past Medical History:  Diagnosis Date  . Abdominal cramping 07/14/2014  . Amenorrhea 07/14/2014  . Chlamydia   . Contraceptive education 11/07/2013  . GERD (gastroesophageal reflux disease)   . PCO (polycystic ovaries) 08/02/2014  . Pelvic pain in female 07/14/2014  . Positive pregnancy test 08/02/2014    Patient Active Problem List   Diagnosis Date Noted  . Pregnancy 03/21/2017  . Previous cesarean delivery affecting pregnancy hesitant to TOLAC. 03/06/2017  . Supervision of normal pregnancy 02/17/2017  . PCO (polycystic ovaries) 08/02/2014  . Pelvic pain in female 07/14/2014  . Amenorrhea 07/14/2014    Past Surgical History:  Procedure Laterality Date  . CESAREAN SECTION    . CHOLECYSTECTOMY  10/01/2016  . DILATION AND CURETTAGE OF UTERUS  12/13/2011?  . INDUCED ABORTION  12/05/2017     OB History    Gravida  4   Para  2   Term  2   Preterm      AB  1   Living  2     SAB  1   TAB      Ectopic      Multiple  0   Live Births  2            Home  Medications    Prior to Admission medications   Medication Sig Start Date End Date Taking? Authorizing Provider  acyclovir (ZOVIRAX) 400 MG tablet Take 1 tablet (400 mg total) by mouth 5 (five) times daily. 11/17/17   Lazaro Arms, MD  acyclovir cream (ZOVIRAX) 5 % Apply 1 application topically every 6 (six) hours. 12/09/17   Jerold Yoss A, PA-C  sulfamethoxazole-trimethoprim (BACTRIM DS,SEPTRA DS) 800-160 MG tablet Take 1 tablet by mouth 2 (two) times daily for 7 days. 12/09/17 12/16/17  Jeanie Sewer, PA-C    Family History Family History  Problem Relation Age of Onset  . Asthma Brother   . Diabetes Maternal Grandmother   . Hypertension Maternal Grandmother   . Diabetes Maternal Grandfather   . Diabetes Paternal Grandmother   . Asthma Brother   . Asthma Brother   . Asthma Brother     Social History Social History   Tobacco Use  . Smoking status: Never Smoker  . Smokeless tobacco: Never Used  Substance Use Topics  . Alcohol use: No  . Drug use: Yes    Types: Marijuana    Comment: 2 times per week     Allergies   Patient has no known allergies.  Review of Systems Review of Systems  Constitutional: Negative for chills and fever.  Respiratory: Negative for shortness of breath.   Cardiovascular: Negative for chest pain.  Gastrointestinal: Negative for nausea and vomiting.  Skin: Positive for rash.     Physical Exam Updated Vital Signs BP 122/82   Pulse 67   Temp 98.4 F (36.9 C)   Resp 16   Ht 5\' 8"  (1.727 m)   Wt 102.1 kg (225 lb)   LMP 09/18/2017   SpO2 100%   Breastfeeding? Unknown Comment: Had an Abortion 12/04/17  BMI 34.21 kg/m   Physical Exam  Constitutional: She appears well-developed and well-nourished. No distress.  HENT:  Head: Normocephalic and atraumatic.  Eyes: Conjunctivae are normal. Right eye exhibits no discharge. Left eye exhibits no discharge.  Neck: No JVD present. No tracheal deviation present.  Cardiovascular: Normal rate.    Pulmonary/Chest: Effort normal.  Abdominal: She exhibits no distension.  Musculoskeletal: She exhibits no edema.  Neurological: She is alert.  Skin: Skin is warm and dry. Rash noted. No erythema.  See images below.  Patient has a 1.5 cm area of contiguous vesicular lesions.  No active drainage.  Mild surrounding erythema.  Tender to palpation.  No underlying crepitus or significant fluctuance.  Psychiatric: She has a normal mood and affect. Her behavior is normal.  Nursing note and vitals reviewed.      ED Treatments / Results  Labs (all labs ordered are listed, but only abnormal results are displayed) Labs Reviewed - No data to display  EKG None  Radiology No results found.  Procedures Procedures (including critical care time)  Medications Ordered in ED Medications - No data to display   Initial Impression / Assessment and Plan / ED Course  I have reviewed the triage vital signs and the nursing notes.  Pertinent labs & imaging results that were available during my care of the patient were reviewed by me and considered in my medical decision making (see chart for details).    Rash consistent with herpes.  Patient is afebrile, vital signs are stable.  She is nontoxic in appearance.  Patient denies any difficulty breathing or swallowing.  Pt has a patent airway without stridor and is handling secretions without difficulty; no angioedema. No warmth, no draining sinus tracts, no superficial abscesses, no bullous impetigo,no desquamation, no target lesions with dusky purpura or a central bulla.  Mildly tender to palpation. No concern for SJS, TEN, TSS, tick borne illness, syphilis or other life-threatening condition.  Will treat with acyclovir cream and cover with Bactrim for coverage of MRSA infection.  No obvious abscess amenable to drainage.  Recommend follow-up with PCP or dermatology for reevaluation.  Discussed strict ED return precautions. Pt verbalized understanding of and  agreement with plan and is safe for discharge home at this time.  No complaints prior to discharge.    Final Clinical Impressions(s) / ED Diagnoses   Final diagnoses:  Rash    ED Discharge Orders        Ordered    acyclovir cream (ZOVIRAX) 5 %  Every 6 hours     12/09/17 1255    sulfamethoxazole-trimethoprim (BACTRIM DS,SEPTRA DS) 800-160 MG tablet  2 times daily     12/09/17 1255       Zaydin Billey, BoboMina A, PA-C 12/09/17 1411    Jacalyn LefevreHaviland, Julie, MD 12/09/17 1614

## 2017-12-09 NOTE — ED Triage Notes (Signed)
Pt reports she had a "boil" to her R lateral hand 1 week ago and it "popped". Now pt states the area began getting red and swollen 3 days ago. Denies fever or drainage.

## 2017-12-09 NOTE — Discharge Instructions (Signed)
Please take all of your antibiotics until finished!   You may develop abdominal discomfort or diarrhea from the antibiotic.  You may help offset this with probiotics which you can buy or get in yogurt. Do not eat  or take the probiotics until 2 hours after your antibiotic.   Apply cream every 6 hours.  Keep the wound clean and dry.  You may cover with a Band-Aid.  Try to avoid scratching the area.  Follow-up with your primary care physician or dermatologist if symptoms persist greater than 1 week.  Return to the emergency department immediately for any concerning signs or symptoms develop such as worsening rash, fevers, persistent vomiting.

## 2017-12-15 ENCOUNTER — Encounter: Payer: Self-pay | Admitting: Women's Health

## 2017-12-15 ENCOUNTER — Ambulatory Visit (INDEPENDENT_AMBULATORY_CARE_PROVIDER_SITE_OTHER): Payer: Medicaid Other | Admitting: Women's Health

## 2017-12-15 ENCOUNTER — Other Ambulatory Visit: Payer: Self-pay

## 2017-12-15 VITALS — BP 128/83 | HR 107 | Ht 68.0 in | Wt 220.0 lb

## 2017-12-15 DIAGNOSIS — A6000 Herpesviral infection of urogenital system, unspecified: Secondary | ICD-10-CM

## 2017-12-15 DIAGNOSIS — B009 Herpesviral infection, unspecified: Secondary | ICD-10-CM

## 2017-12-15 MED ORDER — ACYCLOVIR 400 MG PO TABS: 400.0000 mg | ORAL_TABLET | Freq: Every day | ORAL | 1 refills | Status: DC

## 2017-12-15 MED ORDER — ACYCLOVIR 5 % EX CREA: 1.0000 "application " | TOPICAL_CREAM | Freq: Four times a day (QID) | CUTANEOUS | 0 refills | Status: DC

## 2017-12-15 NOTE — Patient Instructions (Addendum)
Start your birth control today, condoms for 2 weeks   Genital Herpes Genital herpes is a common sexually transmitted infection (STI) that is caused by a virus. The virus spreads from person to person through sexual contact. Infection can cause itching, blisters, and sores around the genitals or rectum. Symptoms may last several days and then go away This is called an outbreak. However, the virus remains in your body, so you may have more outbreaks in the future. The time between outbreaks varies and can be months or years. Genital herpes affects men and women. It is particularly concerning for pregnant women because the virus can be passed to the baby during delivery and can cause serious problems. Genital herpes is also a concern for people who have a weak disease-fighting (immune) system. What are the causes? This condition is caused by the herpes simplex virus (HSV) type 1 or type 2. The virus may spread through:  Sexual contact with an infected person, including vaginal, anal, and oral sex.  Contact with fluid from a herpes sore.  The skin. This means that you can get herpes from an infected partner even if he or she does not have a visible sore or does not know that he or she is infected.  What increases the risk? You are more likely to develop this condition if:  You have sex with many partners.  You do not use latex condoms during sex.  What are the signs or symptoms? Most people do not have symptoms (asymptomatic) or have mild symptoms that may be mistaken for other skin problems. Symptoms may include:  Small red bumps near the genitals, rectum, or mouth. These bumps turn into blisters and then turn into sores.  Flu-like symptoms, including: ? Fever. ? Body aches. ? Swollen lymph nodes. ? Headache.  Painful urination.  Pain and itching in the genital area or rectal area.  Vaginal discharge.  Tingling or shooting pain in the legs and buttocks.  Generally, symptoms are  more severe and last longer during the first (primary) outbreak. Flu-like symptoms are also more common during the primary outbreak. How is this diagnosed? Genital herpes may be diagnosed based on:  A physical exam.  Your medical history.  Blood tests.  A test of a fluid sample (culture) from an open sore.  How is this treated? There is no cure for this condition, but treatment with antiviral medicines that are taken by mouth (orally) can do the following:  Speed up healing and relieve symptoms.  Help to reduce the spread of the virus to sexual partners.  Limit the chance of future outbreaks, or make future outbreaks shorter.  Lessen symptoms of future outbreaks.  Your health care provider may also recommend pain relief medicines, such as aspirin or ibuprofen. Follow these instructions at home: Sexual activity  Do not have sexual contact during active outbreaks.  Practice safe sex. Latex condoms and female condoms may help prevent the spread of the herpes virus. General instructions  Keep the affected areas dry and clean.  Take over-the-counter and prescription medicines only as told by your health care provider.  Avoid rubbing or touching blisters and sores. If you do touch blisters or sores: ? Wash your hands thoroughly with soap and water. ? Do not touch your eyes afterward.  To help relieve pain or itching, you may take the following actions as directed by your health care provider: ? Apply a cold, wet cloth (cold compress) to affected areas 4-6 times a day. ?  Apply a substance that protects your skin and reduces bleeding (astringent). ? Apply a gel that helps relieve pain around sores (lidocaine gel). ? Take a warm, shallow bath that cleans the genital area (sitz bath).  Keep all follow-up visits as told by your health care provider. This is important. How is this prevented?  Use condoms. Although anyone can get genital herpes during sexual contact, even with the  use of a condom, a condom can provide some protection.  Avoid having multiple sexual partners.  Talk with your sexual partner about any symptoms either of you may have. Also, talk with your partner about any history of STIs.  Get tested for STIs before you have sex. Ask your partner to do the same.  Do not have sexual contact if you have symptoms of genital herpes. Contact a health care provider if:  Your symptoms are not improving with medicine.  Your symptoms return.  You have new symptoms.  You have a fever.  You have abdominal pain.  You have redness, swelling, or pain in your eye.  You notice new sores on other parts of your body.  You are a woman and experience bleeding between menstrual periods.  You have had herpes and you become pregnant or plan to become pregnant. Summary  Genital herpes is a common sexually transmitted infection (STI) that is caused by the herpes simplex virus (HSV) type 1 or type 2.  These viruses are most often spread through sexual contact with an infected person.  You are more likely to develop this condition if you have sex with many partners or you have unprotected sex.  Most people do not have symptoms (asymptomatic) or have mild symptoms that may be mistaken for other skin problems. Symptoms occur as outbreaks that may happen months or years apart.  There is no cure for this condition, but treatment with oral antiviral medicines can reduce symptoms, reduce the chance of spreading the virus to a partner, prevent future outbreaks, or shorten future outbreaks. This information is not intended to replace advice given to you by your health care provider. Make sure you discuss any questions you have with your health care provider. Document Released: 05/23/2000 Document Revised: 04/25/2016 Document Reviewed: 04/25/2016 Elsevier Interactive Patient Education  [employer]2018 [employer]

## 2017-12-15 NOTE — Progress Notes (Signed)
   GYN VISIT Patient name: Courtney Zhang MRN [id number]  Date of birth: November 25, 1995 Chief Complaint:   Follow-up (HSV)  History of Present Illness:   Courtney Zhang is a 22 y.o. 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 She had an elective Ab on 12/04/17-has not started birth control, states she has rx for COCs, but hasn't picked them up yet. On 12/09/17 she went to ED w/ lesion on Rt hand c/w herpes, rx'd acyclovir cream and antibiotic in case it became infected. Finished cream, improved some. Didn't have to use antibiotic.   No LMP recorded. The current method of family planning is has rx for COCs. Last pap 05/06/16. Results were:  normal Review of Systems:   Pertinent items are noted in HPI Denies fever/chills, dizziness, headaches, visual disturbances, fatigue, shortness of breath, chest pain, abdominal pain, vomiting, abnormal vaginal discharge/itching/odor/irritation, problems with periods, bowel movements, urination, or intercourse unless otherwise stated above.  Pertinent History Reviewed:  Reviewed past medical,surgical, social, obstetrical and family history.  Reviewed problem list, medications and allergies. Physical Assessment:   Vitals:   12/15/17 1102  BP: 128/83  Pulse: (!) 107  Weight: 220 lb (99.8 kg)  Height: 5\' 8"  (1.727 m)  Body mass index is 33.45 kg/m.       Physical Examination:   General appearance: alert, well appearing, and in no distress  Mental status: alert, oriented to person, place, and time  Skin: warm & dry   Cardiovascular: normal heart rate noted  Respiratory: normal respiratory effort, no distress  Abdomen: soft, non-tender   Pelvic: VULVA: normal appearing vulva with no masses, tenderness or lesions  Extremities: no edema, lesion Rt hand co-examined w/ LHE, states is healing, recommends repeating  acyclovir course x 10d, and acyclovir cream   No results found for this or any previous visit (from the past 24 hour(s)).  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 acyclovir cream (ZOVIRAX) 5 %    Sig: Apply 1 application topically every 6 (six) hours.    Dispense:  5 g    Refill:  0    Order Specific Question:   Supervising Provider    Answer:   EURE, LUTHER H [[id number]]  . acyclovir (ZOVIRAX) 400 MG tablet    Sig: Take 1 tablet (400 mg total) by mouth 5 (five) times daily.    Dispense:  50 tablet    Refill:  1    Order Specific Question:   Supervising Provider    Answer:   Despina HiddenEURE, LUTHER H [[id number]]    No orders of the defined types were placed in this encounter.   Return for Nov for physical.  Cheral MarkerKimberly R Antanasia Kaczynski CNM, [facility] Center[facility]NP-BC 12/15/2017 1:24 PM

## 2017-12-31 ENCOUNTER — Telehealth: Payer: Self-pay | Admitting: *Deleted

## 2017-12-31 ENCOUNTER — Other Ambulatory Visit: Payer: Self-pay | Admitting: Advanced Practice Midwife

## 2017-12-31 MED ORDER — METRONIDAZOLE 500 MG PO TABS: 500.0000 mg | ORAL_TABLET | Freq: Two times a day (BID) | ORAL | 1 refills | Status: DC

## 2017-12-31 NOTE — Telephone Encounter (Signed)
Flagyl sent to pharmacy

## 2017-12-31 NOTE — Telephone Encounter (Signed)
Spoke with pt. Pt has vaginal discharge with odor. Has had BV before. Requesting med. Thanks!! JSY

## 2018-01-01 ENCOUNTER — Encounter (HOSPITAL_COMMUNITY): Payer: Self-pay | Admitting: Emergency Medicine

## 2018-01-01 ENCOUNTER — Other Ambulatory Visit: Payer: Self-pay

## 2018-01-01 ENCOUNTER — Emergency Department (HOSPITAL_COMMUNITY)
Admission: EM | Admit: 2018-01-01 | Discharge: 2018-01-01 | Disposition: A | Discharge status: Home / Self Care | Payer: Medicaid Other | Attending: Emergency Medicine | Admitting: Emergency Medicine

## 2018-01-01 DIAGNOSIS — F17200 Nicotine dependence, unspecified, uncomplicated: Secondary | ICD-10-CM | POA: Diagnosis not present

## 2018-01-01 DIAGNOSIS — Z79899 Other long term (current) drug therapy: Secondary | ICD-10-CM | POA: Diagnosis not present

## 2018-01-01 DIAGNOSIS — B001 Herpesviral vesicular dermatitis: Secondary | ICD-10-CM

## 2018-01-01 DIAGNOSIS — R21 Rash and other nonspecific skin eruption: Secondary | ICD-10-CM | POA: Diagnosis present

## 2018-01-01 NOTE — Discharge Instructions (Addendum)
Your vital signs are within except for elevation in your blood pressure. Please have this rechecked soon. Your exam is consistent with a cold sore of the lip. Please use over the counter Herpecin L or abreva, or Campho-Phenique. This is contagious. Do not kiss anyone. Wash hands frequently.

## 2018-01-01 NOTE — ED Provider Notes (Signed)
[facility] Provider Note   CSN: [id number] Arrival date & time: 01/01/18  1601     History   Chief Complaint Chief Complaint  Patient presents with  . Rash    HPI Courtney Zhang is a 22 y.o. female.  Patient is a 22 year old female who presents to the emergency department with a complaint of a rash on her lip.  Patient states that she was seen here with her son about 3 days ago with a rash on his face.  She says now she has a rash on her lip.  She says it is not painful, but feels funny.  She says that she cannot feel it with a finger, but can feel it with her tongue.,  And  she can see it.  No recent fever.  Patient states that she has 2 young children.  She says she may have also been around someone who has been sick.  The history is provided by the patient.  Rash      Past Medical History:  Diagnosis Date  . Abdominal cramping 07/14/2014  . Amenorrhea 07/14/2014  . Chlamydia   . Contraceptive education 11/07/2013  . GERD (gastroesophageal reflux disease)   . PCO (polycystic ovaries) 08/02/2014  . Pelvic pain in female 07/14/2014  . Positive pregnancy test 08/02/2014    Patient Active Problem List   Diagnosis Date Noted  . 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 Previous cesarean section 03/06/2017  . PCO (polycystic ovaries) 08/02/2014  . Pelvic pain in female 07/14/2014  . Amenorrhea 07/14/2014    Past Surgical History:  Procedure Laterality Date  . CESAREAN SECTION    . CHOLECYSTECTOMY  10/01/2016  . DILATION AND CURETTAGE OF UTERUS  12/13/2011?  . INDUCED ABORTION  12/05/2017     OB History    Gravida  4   Para  2   Term  2   Preterm      AB  2   Living  2     SAB  1   TAB  1   Ectopic      Multiple  0   Live Births  2            Home Medications    Prior to Admission medications   Medication Sig Start Date End Date Taking? Authorizing Provider  acyclovir (ZOVIRAX) 400 MG tablet Take 1 tablet  (400 mg total) by mouth 5 (five) times daily. 12/15/17   Cheral MarkerBooker, Kimberly R, CNM  acyclovir cream (ZOVIRAX) 5 % Apply 1 application topically every 6 (six) hours. 12/15/17   Cheral MarkerBooker, Kimberly R, CNM  metroNIDAZOLE (FLAGYL) 500 MG tablet Take 1 tablet (500 mg total) by mouth 2 (two) times daily. 12/31/17   Jacklyn Shellresenzo-Dishmon, Frances, CNM    Family History Family History  Problem Relation Age of Onset  . Asthma Brother   . Diabetes Maternal Grandmother   . Hypertension Maternal Grandmother   . Diabetes Maternal Grandfather   . Diabetes Paternal Grandmother   . Asthma Brother   . Asthma Brother   . Asthma Brother     Social History Social History   Tobacco Use  . Smoking status: Current Every Day Smoker  . Smokeless tobacco: Never Used  Substance Use Topics  . Alcohol use: Yes    Comment: rarely  . Drug use: Yes    Types: Marijuana    Comment: 2 times per week     Allergies   Patient has no known  allergies.   Review of Systems Review of Systems  Constitutional: Negative for activity change.       All ROS Neg except as noted in HPI  HENT: Negative for nosebleeds.   Eyes: Negative for photophobia and discharge.  Respiratory: Negative for cough, shortness of breath and wheezing.   Cardiovascular: Negative for chest pain and palpitations.  Gastrointestinal: Negative for abdominal pain and blood in stool.  Genitourinary: Negative for dysuria, frequency and hematuria.  Musculoskeletal: Negative for arthralgias, back pain and neck pain.  Skin: Positive for rash.  Neurological: Negative for dizziness, seizures and speech difficulty.  Psychiatric/Behavioral: Negative for confusion and hallucinations.     Physical Exam Updated Vital Signs BP (!) 118/104 (BP Location: Right Arm)   Pulse (!) 104   Temp 98 F (36.7 C) (Temporal)   Resp 16   Ht 5\' 8"  (1.727 m)   Wt 99.8 kg (220 lb)   LMP 09/17/2017   SpO2 100%   BMI 33.45 kg/m   Physical Exam  Constitutional: She is  oriented to person, place, and time. She appears well-developed and well-nourished.  Non-toxic appearance.  HENT:  Head: Normocephalic.  Right Ear: Tympanic membrane and external ear normal.  Left Ear: Tympanic membrane and external ear normal.  There is a slightly raised red area of the external right upper lip, there is minimal communication with the mucosa of the upper lip.  No other lesions noted in the mouth or lip.  Eyes: Pupils are equal, round, and reactive to light. EOM and lids are normal.  Neck: Normal range of motion. Neck supple. Carotid bruit is not present.  Cardiovascular: Normal rate, regular rhythm, normal heart sounds, intact distal pulses and normal pulses.  Pulmonary/Chest: Breath sounds normal. No respiratory distress.  Abdominal: Soft. Bowel sounds are normal. There is no tenderness. There is no guarding.  Musculoskeletal: Normal range of motion.  Lymphadenopathy:       Head (right side): No submandibular adenopathy present.       Head (left side): No submandibular adenopathy present.    She has no cervical adenopathy.  Neurological: She is alert and oriented to person, place, and time. She has normal strength. No cranial nerve deficit or sensory deficit.  Skin: Skin is warm and dry.  Psychiatric: She has a normal mood and affect. Her speech is normal.  Nursing note and vitals reviewed.    ED Treatments / Results  Labs (all labs ordered are listed, but only abnormal results are displayed) Labs Reviewed - No data to display  EKG None  Radiology No results found.  Procedures Procedures (including critical care time)  Medications Ordered in ED Medications - No data to display   Initial Impression / Assessment and Plan / ED Course  I have reviewed the triage vital signs and the nursing notes.  Pertinent labs & imaging results that were available during my care of the patient were reviewed by me and considered in my medical decision making (see chart  for details).       Final Clinical Impressions(s) / ED Diagnoses MDM  Blood pressure is elevated, otherwise vital signs within normal limits.  The examination favors a cold sore of the upper lip.  Patient has 2 small children at home, 1 of them had a rash 3 days ago.  I have asked the patient to keep her distance from others, and not to kiss anyone or allow anyone to share eating utensils.  I have suggested to her to try the  over-the-counter [place], Abreva, or Herperson L.  Patient to follow-up with her primary physician if not improving.   Final diagnoses:  Cold sore    ED Discharge Orders    None       Ivery Quale, Cordelia Poche 01/01/18 1919    Mesner, Barbara Cower, MD 01/02/18 [id number]

## 2018-01-01 NOTE — ED Triage Notes (Signed)
Patient states her child was treated her recently for rash to face and now she has same symptoms to her face.

## 2018-01-11 ENCOUNTER — Telehealth: Payer: Self-pay | Admitting: Obstetrics & Gynecology

## 2018-01-11 NOTE — Telephone Encounter (Signed)
Patient called stating that she would like a call back from the nurse, Patient did not states the reason why. Please contact pt

## 2018-01-11 NOTE — Telephone Encounter (Signed)
Pt called stating that she went to the ED for a red blotch on her lip. She was told that she had a fever blister developing. Pt states that she applied the creams that she was prescribed. She states that it is not getting better or worse. She states that it does not hurt, does not ooze, and does not look like a blister. She is concerned that it is something different. Advised that she could make an appt to be evaluated by a provider.

## 2018-01-12 ENCOUNTER — Ambulatory Visit: Payer: Medicaid Other | Admitting: Adult Health

## 2018-01-12 ENCOUNTER — Encounter: Payer: Self-pay | Admitting: *Deleted

## 2018-01-13 ENCOUNTER — Emergency Department (HOSPITAL_COMMUNITY)
Admission: EM | Admit: 2018-01-13 | Discharge: 2018-01-13 | Disposition: A | Discharge status: Home / Self Care | Payer: Medicaid Other | Attending: Emergency Medicine | Admitting: Emergency Medicine

## 2018-01-13 ENCOUNTER — Other Ambulatory Visit: Payer: Self-pay

## 2018-01-13 ENCOUNTER — Encounter (HOSPITAL_COMMUNITY): Payer: Self-pay | Admitting: Emergency Medicine

## 2018-01-13 DIAGNOSIS — Y9389 Activity, other specified: Secondary | ICD-10-CM | POA: Insufficient documentation

## 2018-01-13 DIAGNOSIS — Y929 Unspecified place or not applicable: Secondary | ICD-10-CM | POA: Insufficient documentation

## 2018-01-13 DIAGNOSIS — Z79899 Other long term (current) drug therapy: Secondary | ICD-10-CM | POA: Insufficient documentation

## 2018-01-13 DIAGNOSIS — Y999 Unspecified external cause status: Secondary | ICD-10-CM | POA: Insufficient documentation

## 2018-01-13 DIAGNOSIS — X58XXXA Exposure to other specified factors, initial encounter: Secondary | ICD-10-CM | POA: Diagnosis not present

## 2018-01-13 DIAGNOSIS — S00511A Abrasion of lip, initial encounter: Secondary | ICD-10-CM

## 2018-01-13 DIAGNOSIS — B001 Herpesviral vesicular dermatitis: Secondary | ICD-10-CM | POA: Diagnosis present

## 2018-01-13 HISTORY — DX: Herpesviral infection of urogenital system, unspecified: A60.00

## 2018-01-13 NOTE — ED Triage Notes (Signed)
Pt reports mouth sore since before July 27th. pt states was putting abreva on it and its getting worse.

## 2018-01-13 NOTE — Discharge Instructions (Addendum)
I suspect your lip is from an abrasion or simply irritated from using the toothpaste, or from rubbing and licking the site.  Try to leave this alone and I suspect it will resolve.

## 2018-01-14 NOTE — ED Provider Notes (Signed)
[facility]   CSN: [id number] Arrival date & time: 01/13/18  1817     History   Chief Complaint Chief Complaint  Patient presents with  . Mouth Lesions    HPI Courtney Zhang is a 22 y.o. female.  The history is provided by the patient.  Mouth Lesions  Location:  Upper lip Quality:  Red Onset quality:  Gradual Severity:  Mild Duration:  2 weeks Progression:  Unchanged Chronicity:  New Context comment:  Was diagnosed with genital herpes and was told she may have a herpes blister forming on her lip as well but it has not resolved Relieved by:  Nothing Worsened by:  Nothing Ineffective treatments: she has used abreva and has also been applying toothpaste with baking soda as was told this would heal "fever blisters" Associated symptoms: no congestion, no fever, no rash and no sore throat    Pt reports having completed 2 courses of acyclovir and also acyclovir topical cream for primary HSV2 genital outbreak and herpetic whitlow on her right hand early last month with resolution of these symptoms.  However, was seen here on 7/26 for a lip lesion  Felt to be a herpes lesion which has not resolved with topical abreva.  She denies pain at the site and cannot feel any bumps/edema, but notes a small area of hyperpigmentation at her right upper lip line. Pt is stressed over her possible continued infectivity and having small children at home.  She is tearful, stating "I can't even kiss my kids".   Past Medical History:  Diagnosis Date  . Abdominal cramping 07/14/2014  . Amenorrhea 07/14/2014  . Chlamydia   . Contraceptive education 11/07/2013  . GERD (gastroesophageal reflux disease)   . Herpes genitalia   . PCO (polycystic ovaries) 08/02/2014  . Pelvic pain in female 07/14/2014  . Positive pregnancy test 08/02/2014    Patient Active Problem List   Diagnosis Date Noted  . 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 Previous cesarean section  03/06/2017  . PCO (polycystic ovaries) 08/02/2014  . Pelvic pain in female 07/14/2014  . Amenorrhea 07/14/2014    Past Surgical History:  Procedure Laterality Date  . CESAREAN SECTION    . CHOLECYSTECTOMY  10/01/2016  . DILATION AND CURETTAGE OF UTERUS  12/13/2011?  . INDUCED ABORTION  12/05/2017     OB History    Gravida  4   Para  2   Term  2   Preterm      AB  2   Living  2     SAB  1   TAB  1   Ectopic      Multiple  0   Live Births  2            Home Medications    Prior to Admission medications   Medication Sig Start Date End Date Taking? Authorizing Provider  acyclovir (ZOVIRAX) 400 MG tablet Take 1 tablet (400 mg total) by mouth 5 (five) times daily. 12/15/17   Cheral MarkerBooker, Kimberly R, CNM  acyclovir cream (ZOVIRAX) 5 % Apply 1 application topically every 6 (six) hours. 12/15/17   Cheral MarkerBooker, Kimberly R, CNM  metroNIDAZOLE (FLAGYL) 500 MG tablet Take 1 tablet (500 mg total) by mouth 2 (two) times daily. 12/31/17   Jacklyn Shellresenzo-Dishmon, Frances, CNM    Family History Family History  Problem Relation Age of Onset  . Asthma Brother   . Diabetes Maternal Grandmother   . Hypertension Maternal Grandmother   .  Diabetes Maternal Grandfather   . Diabetes Paternal Grandmother   . Asthma Brother   . Asthma Brother   . Asthma Brother     Social History Social History   Tobacco Use  . Smoking status: Never Smoker  . Smokeless tobacco: Never Used  Substance Use Topics  . Alcohol use: Yes    Comment: rarely  . Drug use: Yes    Types: Marijuana    Comment: 2 times per week     Allergies   Patient has no known allergies.   Review of Systems Review of Systems  Constitutional: Negative for chills and fever.  HENT: Negative for congestion, mouth sores and sore throat.        Negative except as mentioned in HPI.   Skin: Positive for color change. Negative for rash.  All other systems reviewed and are negative.    Physical Exam Updated Vital Signs BP  (!) 140/97 (BP Location: Right Arm)   Pulse 82   Temp 98.9 F (37.2 C) (Oral)   Resp 18   Ht 5\' 8"  (1.727 m)   Wt 102.1 kg   SpO2 100%   BMI 34.21 kg/m   Physical Exam  Constitutional: She appears well-developed and well-nourished.  Intermittently tearful   HENT:  Head: Normocephalic and atraumatic.  Mouth/Throat: Uvula is midline, oropharynx is clear and moist and mucous membranes are normal. No oral lesions. No oropharyngeal exudate.  Small area of hyperpigmentation along the vermillion border right upper lip.  No lesions, skin intact. No drainage, no vesicles, no scabbing. Normal exam except for difficult to appreciate erythema. Nontender.   Eyes: Conjunctivae are normal.  Neck: Normal range of motion.  Cardiovascular: Normal rate and regular rhythm.  Pulmonary/Chest: Effort normal.  Musculoskeletal: Normal range of motion.  Neurological: She is alert.  Skin: Skin is warm and dry.  Nursing note and vitals reviewed.    ED Treatments / Results  Labs (all labs ordered are listed, but only abnormal results are displayed) Labs Reviewed - No data to display  EKG None  Radiology No results found.  Procedures Procedures (including critical care time)  Medications Ordered in ED Medications - No data to display   Initial Impression / Assessment and Plan / ED Course  I have reviewed the triage vital signs and the nursing notes.  Pertinent labs & imaging results that were available during my care of the patient were reviewed by me and considered in my medical decision making (see chart for details).     Pt with clearly increased stress over recent diagnosis of genital herpes.  She describes that the lip lesion never developed into a blister, open wound, was never tender, but was simply red so possibly was not an oral herpes lesion.  During her visit today she was frequently licking the spot during our conversation.  I suspect this habit and her use of topical toothpaste  is keeping the site irritated.  Reassurance given that if she has no active lesions, she should not be contagious. Advised however at the first sign of reactivation, she should exercise precautions re direct contact.  Pt understands and I think she was reassured regarding this condition.  Advised to stop using toothpaste on her lip and to try to avoid licking the site.  Prn f/u anticipated.  Final Clinical Impressions(s) / ED Diagnoses   Final diagnoses:  Lip abrasion, initial encounter    ED Discharge Orders    None       Railee Bonillas,  Fidela Juneau 01/14/18 1408    Mesner, Barbara Cower, MD 01/16/18 [id number]

## 2018-01-27 ENCOUNTER — Ambulatory Visit: Payer: Medicaid Other | Admitting: Advanced Practice Midwife

## 2018-04-12 ENCOUNTER — Telehealth: Payer: Self-pay | Admitting: *Deleted

## 2018-04-12 NOTE — Telephone Encounter (Signed)
LMOVM that pt would need to schedule an appt for evaluation of s/s

## 2018-04-23 ENCOUNTER — Ambulatory Visit: Payer: Medicaid Other | Admitting: Women's Health

## 2018-05-21 ENCOUNTER — Ambulatory Visit: Payer: Medicaid Other | Admitting: Adult Health

## 2018-07-25 ENCOUNTER — Emergency Department (HOSPITAL_COMMUNITY)
Admission: EM | Admit: 2018-07-25 | Discharge: 2018-07-25 | Disposition: A | Discharge status: Home / Self Care | Payer: Medicaid Other | Attending: Emergency Medicine | Admitting: Emergency Medicine

## 2018-07-25 ENCOUNTER — Encounter (HOSPITAL_COMMUNITY): Payer: Self-pay | Admitting: Emergency Medicine

## 2018-07-25 ENCOUNTER — Other Ambulatory Visit: Payer: Self-pay

## 2018-07-25 DIAGNOSIS — Z79899 Other long term (current) drug therapy: Secondary | ICD-10-CM | POA: Insufficient documentation

## 2018-07-25 DIAGNOSIS — B9689 Other specified bacterial agents as the cause of diseases classified elsewhere: Secondary | ICD-10-CM | POA: Insufficient documentation

## 2018-07-25 DIAGNOSIS — N760 Acute vaginitis: Secondary | ICD-10-CM | POA: Insufficient documentation

## 2018-07-25 LAB — URINALYSIS, ROUTINE W REFLEX MICROSCOPIC
BILIRUBIN URINE: NEGATIVE
Bacteria, UA: NONE SEEN
GLUCOSE, UA: NEGATIVE mg/dL
Hgb urine dipstick: NEGATIVE
KETONES UR: NEGATIVE mg/dL
LEUKOCYTE UA: NEGATIVE
Nitrite: NEGATIVE
PH: 8 (ref 5.0–8.0)
Protein, ur: 30 mg/dL — AB
SPECIFIC GRAVITY, URINE: 1.029 (ref 1.005–1.030)

## 2018-07-25 LAB — WET PREP, GENITAL
SPERM: NONE SEEN
Trich, Wet Prep: NONE SEEN
Yeast Wet Prep HPF POC: NONE SEEN

## 2018-07-25 LAB — HCG, QUANTITATIVE, PREGNANCY: hCG, Beta Chain, Quant, S: <1 m[IU]/mL (ref <5)

## 2018-07-25 MED ORDER — METRONIDAZOLE 500 MG PO TABS: 500.0000 mg | ORAL_TABLET | Freq: Two times a day (BID) | ORAL | 0 refills | Status: DC

## 2018-07-25 MED ORDER — METRONIDAZOLE 500 MG PO TABS
500.0000 mg | ORAL_TABLET | Freq: Once | ORAL | Status: AC
  Administered 2018-07-25: 500 mg via ORAL
  Filled 2018-07-25: qty 1

## 2018-07-25 MED ORDER — ONDANSETRON HCL 4 MG PO TABS
4.0000 mg | ORAL_TABLET | Freq: Once | ORAL | Status: AC
  Administered 2018-07-25: 4 mg via ORAL
  Filled 2018-07-25: qty 1

## 2018-07-25 NOTE — ED Triage Notes (Signed)
Pt c/o of vaginal discharge and itching since last night.   Pt also c/o of cramps and wants to see if she has a ovarian cyst

## 2018-07-25 NOTE — ED Provider Notes (Signed)
[facility] EMERGENCY DEPARTMENT Provider Note   CSN: [id number] Arrival date & time: 07/25/18  1406     History   Chief Complaint Chief Complaint  Patient presents with  . Vaginal Discharge    HPI Courtney Zhang is a 23 y.o. female.  The history is provided by the patient.  Vaginal Discharge  Quality:  Milky Severity:  Moderate Onset quality:  Gradual Duration:  1 day Timing:  Intermittent Progression:  Unchanged Chronicity:  New Context: spontaneously   Context: not recent antibiotic use   Relieved by:  Nothing Worsened by:  Nothing Ineffective treatments:  None tried Associated symptoms: nausea and vaginal itching   Associated symptoms: no abdominal pain, no dysuria, no fever and no rash   Risk factors: unprotected sex   Risk factors: no new sexual partner     Past Medical History:  Diagnosis Date  . Abdominal cramping 07/14/2014  . Amenorrhea 07/14/2014  . Chlamydia   . Contraceptive education 11/07/2013  . GERD (gastroesophageal reflux disease)   . Herpes genitalia   . PCO (polycystic ovaries) 08/02/2014  . Pelvic pain in female 07/14/2014  . Positive pregnancy test 08/02/2014    Patient Active Problem List   Diagnosis Date Noted  . 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 Previous cesarean section 03/06/2017  . PCO (polycystic ovaries) 08/02/2014  . Pelvic pain in female 07/14/2014  . Amenorrhea 07/14/2014    Past Surgical History:  Procedure Laterality Date  . CESAREAN SECTION    . CHOLECYSTECTOMY  10/01/2016  . DILATION AND CURETTAGE OF UTERUS  12/13/2011?  . INDUCED ABORTION  12/05/2017     OB History    Gravida  4   Para  2   Term  2   Preterm      AB  2   Living  2     SAB  1   TAB  1   Ectopic      Multiple  0   Live Births  2            Home Medications    Prior to Admission medications   Medication Sig Start Date End Date Taking? Authorizing Provider  acyclovir (ZOVIRAX) 400 MG tablet Take 1 tablet  (400 mg total) by mouth 5 (five) times daily. 12/15/17   Cheral Marker, CNM  acyclovir cream (ZOVIRAX) 5 % Apply 1 application topically every 6 (six) hours. 12/15/17   Cheral Marker, CNM  metroNIDAZOLE (FLAGYL) 500 MG tablet Take 1 tablet (500 mg total) by mouth 2 (two) times daily. 12/31/17   Jacklyn Shell, CNM    Family History Family History  Problem Relation Age of Onset  . Asthma Brother   . Diabetes Maternal Grandmother   . Hypertension Maternal Grandmother   . Diabetes Maternal Grandfather   . Diabetes Paternal Grandmother   . Asthma Brother   . Asthma Brother   . Asthma Brother     Social History Social History   Tobacco Use  . Smoking status: Never Smoker  . Smokeless tobacco: Never Used  Substance Use Topics  . Alcohol use: Yes    Comment: rarely  . Drug use: Yes    Types: Marijuana    Comment: 2 times per week     Allergies   Patient has no known allergies.   Review of Systems Review of Systems  Constitutional: Negative for activity change and fever.       All ROS Neg except as noted  in HPI  HENT: Negative for nosebleeds.   Eyes: Negative for photophobia and discharge.  Respiratory: Negative for cough, shortness of breath and wheezing.   Cardiovascular: Negative for chest pain and palpitations.  Gastrointestinal: Positive for nausea. Negative for abdominal pain and blood in stool.  Genitourinary: Positive for vaginal discharge. Negative for dysuria, frequency and hematuria.  Musculoskeletal: Negative for arthralgias, back pain and neck pain.  Skin: Negative.   Neurological: Negative for dizziness, seizures and speech difficulty.  Psychiatric/Behavioral: Negative for confusion and hallucinations.     Physical Exam Updated Vital Signs BP 136/88 (BP Location: Right Arm)   Pulse (S) (!) 113   Temp 99.3 F (37.4 C) (Oral)   Resp 18   Ht 5\' 8"  (1.727 m)   Wt 105.2 kg   LMP 06/29/2018   SpO2 100%   BMI 35.26 kg/m   Physical  Exam Vitals signs and nursing note reviewed. Exam conducted with a chaperone present.  Constitutional:      Appearance: She is well-developed. She is not toxic-appearing.  HENT:     Head: Normocephalic.     Right Ear: Tympanic membrane and external ear normal.     Left Ear: Tympanic membrane and external ear normal.  Eyes:     General: Lids are normal.     Pupils: Pupils are equal, round, and reactive to light.  Neck:     Musculoskeletal: Normal range of motion and neck supple.     Vascular: No carotid bruit.  Cardiovascular:     Rate and Rhythm: Normal rate and regular rhythm.     Pulses: Normal pulses.     Heart sounds: Normal heart sounds.  Pulmonary:     Effort: No respiratory distress.     Breath sounds: Normal breath sounds.  Abdominal:     General: Bowel sounds are normal.     Palpations: Abdomen is soft.     Tenderness: There is no abdominal tenderness. There is no guarding.  Genitourinary:    Exam position: Lithotomy position.     Pubic Area: No rash.      Labia:        Right: No rash or injury.        Left: No rash or injury.      Urethra: No urethral swelling.     Vagina: No foreign body. Vaginal discharge present. No bleeding.     Cervix: Friability and erythema present.     Uterus: Not enlarged and not tender.      Adnexa:        Right: No mass, tenderness or fullness.         Left: No mass, tenderness or fullness.       Rectum: Normal.  Musculoskeletal: Normal range of motion.  Lymphadenopathy:     Head:     Right side of head: No submandibular adenopathy.     Left side of head: No submandibular adenopathy.     Cervical: No cervical adenopathy.  Skin:    General: Skin is warm and dry.  Neurological:     Mental Status: She is alert and oriented to person, place, and time.     Cranial Nerves: No cranial nerve deficit.     Sensory: No sensory deficit.  Psychiatric:        Speech: Speech normal.      ED Treatments / Results  Labs (all labs ordered  are listed, but only abnormal results are displayed) Labs Reviewed - No data to display  EKG None  Radiology No results found.  Procedures Procedures (including critical care time)  Medications Ordered in ED Medications - No data to display   Initial Impression / Assessment and Plan / ED Course  I have reviewed the triage vital signs and the nursing notes.  Pertinent labs & imaging results that were available during my care of the patient were reviewed by me and considered in my medical decision making (see chart for details).       Final Clinical Impressions(s) / ED Diagnoses MDM  Temperature is 99.3, pulse rate is 113, blood pressure is 136/88, and the pulse oximetry is 100% on room air.  Within normal limits by my interpretation.  Urine analysis is negative for acute problem.  Wet prep shows clue cells present along with many bacteria.  Beta hCG is negative for pregnancy.  Patient is treated with Flagyl for bacterial vaginosis.  Culture for gonorrhea chlamydia, as well as blood work for HIV and syphilis have been sent to the lab.   Final diagnoses:  BV (bacterial vaginosis)    ED Discharge Orders         Ordered    metroNIDAZOLE (FLAGYL) 500 MG tablet  2 times daily     07/25/18 1849           Ivery Quale, PA-C 07/25/18 1911    Mesner, Barbara Cower, MD 07/25/18 2036

## 2018-07-25 NOTE — Discharge Instructions (Addendum)
Your temperature was 99.3 today, and your heart rate initially was 113.  Your oxygen level is 100% on room air, which is within normal limits.  Your urine analysis is negative for urinary tract infection.  Your pregnancy test is negative.  The wet prep shows evidence of bacterial vaginosis.  The other test will take 2 to 3 days to return.  Someone will call you from the flow managers office if either of these test results need further attention.  Use Flagyl 2 times daily with food.  Please do not use any alcohol products while taking this medication.  Please see your primary physician or GYN physician if problems, changes, or not improving.

## 2018-07-27 LAB — RPR: RPR Ser Ql: NONREACTIVE

## 2018-07-27 LAB — HIV ANTIBODY (ROUTINE TESTING W REFLEX): HIV Screen 4th Generation wRfx: NONREACTIVE

## 2018-07-29 LAB — GC/CHLAMYDIA PROBE AMP (~~LOC~~) NOT AT ARMC
Chlamydia: NEGATIVE
NEISSERIA GONORRHEA: NEGATIVE

## 2018-10-26 ENCOUNTER — Other Ambulatory Visit: Payer: Self-pay

## 2018-10-26 ENCOUNTER — Encounter (HOSPITAL_COMMUNITY): Payer: Self-pay | Admitting: Emergency Medicine

## 2018-10-26 ENCOUNTER — Emergency Department (HOSPITAL_COMMUNITY)
Admission: EM | Admit: 2018-10-26 | Discharge: 2018-10-26 | Disposition: A | Discharge status: Home / Self Care | Payer: Self-pay | Attending: Emergency Medicine | Admitting: Emergency Medicine

## 2018-10-26 DIAGNOSIS — B9689 Other specified bacterial agents as the cause of diseases classified elsewhere: Secondary | ICD-10-CM | POA: Insufficient documentation

## 2018-10-26 DIAGNOSIS — F1210 Cannabis abuse, uncomplicated: Secondary | ICD-10-CM | POA: Insufficient documentation

## 2018-10-26 DIAGNOSIS — N760 Acute vaginitis: Secondary | ICD-10-CM | POA: Insufficient documentation

## 2018-10-26 LAB — URINALYSIS, ROUTINE W REFLEX MICROSCOPIC
Bilirubin Urine: NEGATIVE
Glucose, UA: NEGATIVE mg/dL
Hgb urine dipstick: NEGATIVE
Ketones, ur: NEGATIVE mg/dL
Leukocytes,Ua: NEGATIVE
Nitrite: NEGATIVE
Protein, ur: NEGATIVE mg/dL
Specific Gravity, Urine: 1.012 (ref 1.005–1.030)
pH: 7 (ref 5.0–8.0)

## 2018-10-26 LAB — PREGNANCY, URINE: Preg Test, Ur: NEGATIVE

## 2018-10-26 LAB — WET PREP, GENITAL
Sperm: NONE SEEN
Trich, Wet Prep: NONE SEEN
Yeast Wet Prep HPF POC: NONE SEEN

## 2018-10-26 MED ORDER — METRONIDAZOLE 500 MG PO TABS: 500.0000 mg | ORAL_TABLET | Freq: Two times a day (BID) | ORAL | 0 refills | Status: AC

## 2018-10-26 NOTE — ED Triage Notes (Signed)
Pt c/o malodorous vaginal discharge that is white x 3-4 days. Pt also reports that this discharge is frequent. Pt endorse abdominal pain that feels like "hunger pains." Pt states she gets frequent vaginal bacterial infections.

## 2018-10-26 NOTE — ED Notes (Signed)
Pelvic setup at bedside.

## 2018-10-26 NOTE — ED Provider Notes (Signed)
[facility] EMERGENCY DEPARTMENT Provider Note   CSN: [id number] Arrival date & time: 10/26/18  1541    History   Chief Complaint Chief Complaint  Patient presents with  . Vaginal Discharge    HPI Courtney Zhang is a 23 y.o. female.     The history is provided by the patient. No language interpreter was used.  Vaginal Discharge  Quality:  Watery Severity:  Moderate Onset quality:  Gradual Timing:  Constant Progression:  Worsening Chronicity:  Recurrent Relieved by:  Nothing Worsened by:  Nothing Ineffective treatments:  None tried Risk factors: no STI    Pt thinks she probably has BV. Pt reports she has had in the past  Past Medical History:  Diagnosis Date  . Abdominal cramping 07/14/2014  . Amenorrhea 07/14/2014  . Chlamydia   . Contraceptive education 11/07/2013  . GERD (gastroesophageal reflux disease)   . Herpes genitalia   . PCO (polycystic ovaries) 08/02/2014  . Pelvic pain in female 07/14/2014  . Positive pregnancy test 08/02/2014    Patient Active Problem List   Diagnosis Date Noted  . 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 Previous cesarean section 03/06/2017  . PCO (polycystic ovaries) 08/02/2014  . Pelvic pain in female 07/14/2014  . Amenorrhea 07/14/2014    Past Surgical History:  Procedure Laterality Date  . CESAREAN SECTION    . CHOLECYSTECTOMY  10/01/2016  . DILATION AND CURETTAGE OF UTERUS  12/13/2011?  . INDUCED ABORTION  12/05/2017     OB History    Gravida  4   Para  2   Term  2   Preterm      AB  2   Living  2     SAB  1   TAB  1   Ectopic      Multiple  0   Live Births  2            Home Medications    Prior to Admission medications   Not on File    Family History Family History  Problem Relation Age of Onset  . Asthma Brother   . Diabetes Maternal Grandmother   . Hypertension Maternal Grandmother   . Diabetes Maternal Grandfather   . Diabetes Paternal Grandmother   . Asthma Brother    . Asthma Brother   . Asthma Brother     Social History Social History   Tobacco Use  . Smoking status: Never Smoker  . Smokeless tobacco: Never Used  Substance Use Topics  . Alcohol use: Yes    Comment: rarely  . Drug use: Yes    Types: Marijuana    Comment: 2 times per week     Allergies   Patient has no known allergies.   Review of Systems Review of Systems  Genitourinary: Positive for vaginal discharge.  All other systems reviewed and are negative.    Physical Exam Updated Vital Signs BP 112/76 (BP Location: Left Arm)   Pulse 83   Temp 98.1 F (36.7 C) (Oral)   Resp 14   Ht 5\' 8"  (1.727 m)   Wt 93 kg   LMP 10/11/2018 (Approximate)   SpO2 100%   BMI 31.17 kg/m   Physical Exam Constitutional:      Appearance: She is well-developed.  HENT:     Head: Normocephalic and atraumatic.  Eyes:     Conjunctiva/sclera: Conjunctivae normal.     Pupils: Pupils are equal, round, and reactive to light.  Neck:  Musculoskeletal: Normal range of motion and neck supple.  Cardiovascular:     Rate and Rhythm: Normal rate.  Abdominal:     Palpations: Abdomen is soft.     Tenderness: There is no abdominal tenderness.  Genitourinary:    Vagina: Vaginal discharge present.     Comments: Vaginal discharge,  Thick white,  Adnexa no masses,  Cervix nontender Musculoskeletal: Normal range of motion.  Skin:    General: Skin is warm.      ED Treatments / Results  Labs (all labs ordered are listed, but only abnormal results are displayed) Labs Reviewed  WET PREP, GENITAL - Abnormal; Notable for the following components:      Result Value   Clue Cells Wet Prep HPF POC PRESENT (*)    WBC, Wet Prep HPF POC FEW (*)    All other components within normal limits  URINALYSIS, ROUTINE W REFLEX MICROSCOPIC  PREGNANCY, URINE  GC/CHLAMYDIA PROBE AMP ([place]) NOT AT [facility]    EKG None  Radiology No results found.  Procedures Procedures (including critical care  time)  Medications Ordered in ED Medications - No data to display   Initial Impression / Assessment and Plan / ED Course  I have reviewed the triage vital signs and the nursing notes.  Pertinent labs & imaging results that were available during my care of the patient were reviewed by me and considered in my medical decision making (see chart for details).        MDM  Wet prep shows clue cells.    Final Clinical Impressions(s) / ED Diagnoses   Final diagnoses:  BV (bacterial vaginosis)    ED Discharge Orders         Ordered    metroNIDAZOLE (FLAGYL) 500 MG tablet  2 times daily     10/26/18 1719        An After Visit Summary was printed and given to the patient.    Elson AreasSofia, Odyssey Vasbinder K, [place] JerseyPA-C 10/26/18 1719    Mancel BaleWentz, Elliott, MD 10/27/18 1135

## 2018-10-27 LAB — GC/CHLAMYDIA PROBE AMP (~~LOC~~) NOT AT ARMC
Chlamydia: NEGATIVE
Neisseria Gonorrhea: NEGATIVE

## 2018-11-23 ENCOUNTER — Encounter (HOSPITAL_COMMUNITY): Payer: Self-pay | Admitting: *Deleted

## 2018-11-23 ENCOUNTER — Other Ambulatory Visit: Payer: Self-pay

## 2018-11-23 ENCOUNTER — Emergency Department (HOSPITAL_COMMUNITY)
Admission: EM | Admit: 2018-11-23 | Discharge: 2018-11-23 | Disposition: A | Discharge status: Home / Self Care | Payer: Self-pay | Attending: Emergency Medicine | Admitting: Emergency Medicine

## 2018-11-23 DIAGNOSIS — Z3202 Encounter for pregnancy test, result negative: Secondary | ICD-10-CM | POA: Insufficient documentation

## 2018-11-23 DIAGNOSIS — R112 Nausea with vomiting, unspecified: Secondary | ICD-10-CM | POA: Insufficient documentation

## 2018-11-23 LAB — PREGNANCY, URINE: Preg Test, Ur: NEGATIVE

## 2018-11-23 MED ORDER — ONDANSETRON HCL 4 MG PO TABS: 4.0000 mg | ORAL_TABLET | Freq: Three times a day (TID) | ORAL | 0 refills | Status: DC | PRN

## 2018-11-23 NOTE — ED Triage Notes (Signed)
Pt c/o lower abdominal pain, vomiting, fatigue that has been going during pt's last menstrual period. Pt's LMP was July 7-13. Pt reports she took a pregnancy test today because these symptoms were unusual for her during her menstrual cycle.

## 2018-11-23 NOTE — Discharge Instructions (Signed)
Your pregnancy test was negative today.   You were given an anti-nausea medication to help if you have continued vomiting.   Please follow up with your primary doctor within the next 5-7 days.  If you do not have a primary care provider, information for a healthcare clinic has been provided for you to make arrangements for follow up care. Please return to the ER sooner if you have any new or worsening symptoms, or if you have any of the following symptoms:  Abdominal pain that does not go away.  You have a fever.  You keep throwing up (vomiting).  The pain is felt only in portions of the abdomen. Pain in the right side could possibly be appendicitis. In an adult, pain in the left lower portion of the abdomen could be colitis or diverticulitis.  You pass bloody or black tarry stools.  There is bright red blood in the stool.  The constipation stays for more than 4 days.  There is belly (abdominal) or rectal pain.  You do not seem to be getting better.  You have any questions or concerns.

## 2018-11-23 NOTE — ED Provider Notes (Signed)
[facility] EMERGENCY DEPARTMENT Provider Note   CSN: [id number] Arrival date & time: 11/23/18  1454    History   Chief Complaint Chief Complaint  Patient presents with  . Possible Pregnancy  . Abdominal Pain    HPI Courtney Zhang is a 23 y.o. female.     HPI   Pt is a 23 y/o female with a h/o PCOS, STDs, GERD, who presents to the ED today for eval of abd pain and possible pregnancy. Pt states she had a normal menstrual cycle from 6/7/-6/13. States she has been having intermittent sharp cramps to her abdomen for the last several days. Currently rates pain 7/10.  She states she took a pregnancy test yesterday and it was positive. Reports frequency, but denies dysuria.  She has had associated intermittent vomiting. She has had 1-2 episodes of vomiting per day for the last 3 days. Denies continued vaginal bleeding. Denies abnormal vaginal discharge. She was recently tested for STD's last month and states she has had unprotected intercourse twice since then.   Past Medical History:  Diagnosis Date  . Abdominal cramping 07/14/2014  . Amenorrhea 07/14/2014  . Chlamydia   . Contraceptive education 11/07/2013  . GERD (gastroesophageal reflux disease)   . Herpes genitalia   . PCO (polycystic ovaries) 08/02/2014  . Pelvic pain in female 07/14/2014  . Positive pregnancy test 08/02/2014    Patient Active Problem List   Diagnosis Date Noted  . 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 Previous cesarean section 03/06/2017  . PCO (polycystic ovaries) 08/02/2014  . Pelvic pain in female 07/14/2014  . Amenorrhea 07/14/2014    Past Surgical History:  Procedure Laterality Date  . CESAREAN SECTION    . CHOLECYSTECTOMY  10/01/2016  . DILATION AND CURETTAGE OF UTERUS  12/13/2011?  . INDUCED ABORTION  12/05/2017     OB History    Gravida  4   Para  2   Term  2   Preterm      AB  2   Living  2     SAB  1   TAB  1   Ectopic      Multiple  0   Live Births  2           Home Medications    Prior to Admission medications   Medication Sig Start Date End Date Taking? Authorizing Provider  ondansetron (ZOFRAN) 4 MG tablet Take 1 tablet (4 mg total) by mouth every 8 (eight) hours as needed for nausea or vomiting. 11/23/18   Hayven Fatima S, PA-C    Family History Family History  Problem Relation Age of Onset  . Asthma Brother   . Diabetes Maternal Grandmother   . Hypertension Maternal Grandmother   . Diabetes Maternal Grandfather   . Diabetes Paternal Grandmother   . Asthma Brother   . Asthma Brother   . Asthma Brother     Social History Social History   Tobacco Use  . Smoking status: Never Smoker  . Smokeless tobacco: Never Used  Substance Use Topics  . Alcohol use: Yes  . Drug use: Yes    Types: Marijuana     Allergies   Patient has no known allergies.   Review of Systems Review of Systems  Constitutional: Negative for fever.  HENT: Negative for ear pain and sore throat.   Eyes: Negative for visual disturbance.  Respiratory: Negative for cough and shortness of breath.   Cardiovascular: Negative for chest pain.  Gastrointestinal: Positive for abdominal pain, nausea and vomiting. Negative for constipation and diarrhea.  Genitourinary: Positive for frequency. Negative for dysuria, hematuria, urgency, vaginal bleeding and vaginal discharge.  Musculoskeletal: Negative for back pain.  Skin: Negative for rash.  Neurological: Negative for headaches.  All other systems reviewed and are negative.    Physical Exam Updated Vital Signs BP (!) 123/92   Pulse 93   Temp 98.2 F (36.8 C)   Resp 18   Ht 5\' 8"  (1.727 m)   Wt 105.5 kg   LMP 11/14/2018 (Exact Date)   SpO2 100%   BMI 35.37 kg/m   Physical Exam Vitals signs and nursing note reviewed.  Constitutional:      General: She is not in acute distress.    Appearance: She is well-developed.  HENT:     Head: Normocephalic and atraumatic.  Eyes:      Conjunctiva/sclera: Conjunctivae normal.  Neck:     Musculoskeletal: Neck supple.  Cardiovascular:     Rate and Rhythm: Normal rate and regular rhythm.     Heart sounds: Normal heart sounds. No murmur.  Pulmonary:     Effort: Pulmonary effort is normal. No respiratory distress.     Breath sounds: Normal breath sounds. No wheezing, rhonchi or rales.  Abdominal:     Palpations: Abdomen is soft.     Tenderness: There is no abdominal tenderness. There is no guarding or rebound.  Genitourinary:    Comments: Pt declined Skin:    General: Skin is warm and dry.  Neurological:     Mental Status: She is alert.      ED Treatments / Results  Labs (all labs ordered are listed, but only abnormal results are displayed) Labs Reviewed  PREGNANCY, URINE    EKG None  Radiology No results found.  Procedures Procedures (including critical care time)  Medications Ordered in ED Medications - No data to display   Initial Impression / Assessment and Plan / ED Course  I have reviewed the triage vital signs and the nursing notes.  Pertinent labs & imaging results that were available during my care of the patient were reviewed by me and considered in my medical decision making (see chart for details).   Final Clinical Impressions(s) / ED Diagnoses   Final diagnoses:  Pregnancy examination or test, negative result  Non-intractable vomiting with nausea, unspecified vomiting type   Pt presenting with concern for abd pain, nv and possible pregnancy after having a positive pregnancy test at home.  On exam pt has no abdominal tenderness, guarding or rebound. She tolerated water during my exam. I informed her of the negative pregnancy test results. I offered further w/u to pt including checking a UA and STD testing however she declined further w/u.   I will give zofran due to concern for NV. At this time I do not feel that labs are indicated given her normal abd exam, normal vitals, and  ability to tolerate po. I do not think that this will change her management. Advised to f/u with pcp and return of worse. She voices understanding and is in agreement with plan. All questions answered, pt stable for d/c.   ED Discharge Orders         Ordered    ondansetron (ZOFRAN) 4 MG tablet  Every 8 hours PRN     11/23/18 [id number]           Akayla Brass S, PA-C 11/23/18 1649    Bethann BerkshireZammit, Joseph, MD 11/25/18 [phone]

## 2018-12-13 ENCOUNTER — Other Ambulatory Visit: Payer: Self-pay

## 2018-12-13 ENCOUNTER — Encounter (HOSPITAL_COMMUNITY): Payer: Self-pay | Admitting: Emergency Medicine

## 2018-12-13 ENCOUNTER — Emergency Department (HOSPITAL_COMMUNITY)
Admission: EM | Admit: 2018-12-13 | Discharge: 2018-12-13 | Disposition: A | Discharge status: Home / Self Care | Payer: Self-pay | Attending: Emergency Medicine | Admitting: Emergency Medicine

## 2018-12-13 DIAGNOSIS — Z711 Person with feared health complaint in whom no diagnosis is made: Secondary | ICD-10-CM

## 2018-12-13 DIAGNOSIS — N760 Acute vaginitis: Secondary | ICD-10-CM | POA: Insufficient documentation

## 2018-12-13 DIAGNOSIS — Z113 Encounter for screening for infections with a predominantly sexual mode of transmission: Secondary | ICD-10-CM | POA: Insufficient documentation

## 2018-12-13 DIAGNOSIS — B9689 Other specified bacterial agents as the cause of diseases classified elsewhere: Secondary | ICD-10-CM

## 2018-12-13 DIAGNOSIS — R300 Dysuria: Secondary | ICD-10-CM | POA: Insufficient documentation

## 2018-12-13 LAB — URINALYSIS, ROUTINE W REFLEX MICROSCOPIC
Bilirubin Urine: NEGATIVE
Glucose, UA: NEGATIVE mg/dL
Hgb urine dipstick: NEGATIVE
Ketones, ur: NEGATIVE mg/dL
Leukocytes,Ua: NEGATIVE
Nitrite: NEGATIVE
Protein, ur: NEGATIVE mg/dL
Specific Gravity, Urine: 1.014 (ref 1.005–1.030)
pH: 6 (ref 5.0–8.0)

## 2018-12-13 LAB — WET PREP, GENITAL
Sperm: NONE SEEN
Trich, Wet Prep: NONE SEEN
Yeast Wet Prep HPF POC: NONE SEEN

## 2018-12-13 LAB — PREGNANCY, URINE: Preg Test, Ur: NEGATIVE

## 2018-12-13 MED ORDER — AZITHROMYCIN 250 MG PO TABS
1000.0000 mg | ORAL_TABLET | Freq: Once | ORAL | Status: AC
  Administered 2018-12-13: 1000 mg via ORAL
  Filled 2018-12-13: qty 4

## 2018-12-13 MED ORDER — LIDOCAINE HCL (PF) 1 % IJ SOLN
INTRAMUSCULAR | Status: AC
  Administered 2018-12-13: 0.9 mL
  Filled 2018-12-13: qty 2

## 2018-12-13 MED ORDER — CEFTRIAXONE SODIUM 250 MG IJ SOLR
250.0000 mg | Freq: Once | INTRAMUSCULAR | Status: AC
  Administered 2018-12-13: 250 mg via INTRAMUSCULAR
  Filled 2018-12-13: qty 250

## 2018-12-13 MED ORDER — METRONIDAZOLE 500 MG PO TABS: 500.0000 mg | ORAL_TABLET | Freq: Two times a day (BID) | ORAL | 0 refills | Status: DC

## 2018-12-13 NOTE — Discharge Instructions (Addendum)
You have been diagnosed today with concern for STD without a diagnosis and bacterial vaginosis.  At this time there does not appear to be the presence of an emergent medical condition, however there is always the potential for conditions to change. Please read and follow the below instructions.  Please return to the Emergency Department immediately for any new or worsening symptoms or if your symptoms do not improve within 3 days. Please be sure to follow up with your Primary Care Provider and OB/GYN within one week regarding your visit today; please call their office to schedule an appointment even if you are feeling better for a follow-up visit. These take the medication Flagyl as prescribed for treatment of your bacterial vaginosis.  Do not drink alcohol while taking this medication as will make you sick and vomit.  Do not take this medication if you are breast-feeding. You have been treated presumptively today for gonorrhea and chlamydia.  You have been tested today for gonorrhea and chlamydia as well as HIV and syphilis. These results will be available in approximately 3 days. You may check your MyChart account for results. Please inform all sexual partners of positive results and that they should be tested and treated as well. Please wait 2 weeks and be sure that you and your partners are symptom free before returning to sexual activity. Please use protection with every sexual encounter. Follow Up: Please followup with your primary doctor in 3 days for discussion of your diagnoses and further evaluation after today's visit; if you do not have a primary care doctor use the resource guide provided to find one; Please return to the ER for new/concerning or worsening symptoms, pain, high fevers or persistent vomiting.  Please read the additional information packets attached to your discharge summary.  Do not take your medicine if  develop an itchy rash, swelling in your mouth or lips, or difficulty  breathing; call 911 and seek immediate emergency medical attention if this occurs.

## 2018-12-13 NOTE — ED Provider Notes (Signed)
[facility] EMERGENCY DEPARTMENT Provider Note   CSN: [id number] Arrival date & time: 12/13/18  1357    History   Chief Complaint Chief Complaint  Patient presents with   Urinary Tract Infection    HPI Courtney Zhang is a 23 y.o. female presenting today for 2-day history of vaginal discharge.  Patient reports that she has had an increase in white foul-smelling discharge yesterday.  She reports that this is typical right before she begins her menstrual cycle which she reports should start any day now however she feels it smells slightly worse than normal which concerned her.  Additionally patient states that when she first woke up this morning and urinated she had a slight burning sensation with urination without hematuria, she reports that she is really urinated multiple times since that time without any dysuria.  She denies fever/chills, abdominal pain, nausea/vomiting, diarrhea, blood in stool, vaginal bleeding or any additional concerns.  Patient reports that she is sexually active with one female without protection.  She reports that she would like to be tested for STDs today.    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   INDUCED ABORTION  12/05/2017     OB History    Gravida  4   Para  2     Term  2   Preterm      AB  2   Living  2     SAB  1   TAB  1   Ectopic      Multiple  0   Live Births  2            Home Medications    Prior to Admission medications   Medication Sig Start Date End Date Taking? Authorizing Provider  ondansetron (ZOFRAN) 4 MG tablet Take 1 tablet (4 mg total) by mouth every 8 (eight) hours as needed for nausea or vomiting. 11/23/18  Yes Couture, Cortni S, PA-C  metroNIDAZOLE (FLAGYL) 500 MG tablet Take 1 tablet (500 mg total) by mouth 2 (two) times daily for 7 days. 12/13/18 12/20/18  Bill SalinasMorelli, Jagger Demonte A, PA-C    Family History Family History  Problem Relation Age of Onset   Asthma Brother    Diabetes Maternal Grandmother    Hypertension Maternal Grandmother    Diabetes Maternal Grandfather    Diabetes Paternal Grandmother    Asthma Brother    Asthma Brother    Asthma Brother     Social History Social History   Tobacco Use   Smoking status: Never Smoker   Smokeless tobacco: Never Used  Substance Use Topics   Alcohol use: Yes   Drug use: Yes    Types: Marijuana     Allergies   Patient has no known allergies.   Review of Systems  Review of Systems  Constitutional: Negative for chills and fever.  Gastrointestinal: Negative.  Negative for abdominal pain, nausea and vomiting.  Genitourinary: Positive for dysuria (One episode this morning, none since) and vaginal discharge. Negative for flank pain, frequency, hematuria, pelvic pain, vaginal bleeding and vaginal pain.  All other systems reviewed and are negative.  Physical Exam Updated Vital Signs BP 119/84 (BP Location: Right Arm)    Pulse 80    Temp 98.6 F (37 C) (Oral)    Resp 15    Ht 5\' 8"  (1.727 m)    Wt 102.1 kg    LMP 11/14/2018 (Exact Date)    SpO2 100%    BMI 34.21 kg/m   Physical Exam Constitutional:      General: She is not in acute distress.    Appearance: Normal appearance. She is well-developed. She is not ill-appearing or diaphoretic.   HENT:     Head: Normocephalic and atraumatic.     Right Ear: External ear normal.     Left Ear: External ear normal.     Nose: Nose normal.  Eyes:     General: Vision grossly intact. Gaze aligned appropriately.     Pupils: Pupils are equal, round, and reactive to light.  Neck:     Musculoskeletal: Normal range of motion.     Trachea: Trachea and phonation normal. No tracheal deviation.  Pulmonary:     Effort: Pulmonary effort is normal. No respiratory distress.  Abdominal:     General: There is no distension.     Palpations: Abdomen is soft.     Tenderness: There is no abdominal tenderness. There is no guarding or rebound.  Genitourinary:    Comments: Exam chaperoned by Peach RN.  Pelvic exam: normal external genitalia without evidence of trauma. VULVA: normal appearing vulva with no masses, tenderness or lesion. VAGINA: normal appearing vagina with normal color, no lesions, moderate amount of white discharge CERVIX: normal appearing cervix without lesions, cervical motion tenderness absent, cervical os closed without purulent discharge Wet prep and DNA probe for chlamydia and GC obtained.  ADNEXA: normal adnexa in size, nontender and no masses UTERUS: uterus is normal size, shape, consistency and nontender.  Musculoskeletal: Normal range of motion.  Skin:    General: Skin is warm and dry.  Neurological:     Mental Status: She is alert.     GCS: GCS eye subscore is 4. GCS verbal subscore is 5. GCS motor subscore is 6.     Comments: Speech is clear and goal oriented, follows commands Major Cranial nerves without deficit, no facial droop Moves extremities without ataxia, coordination intact  Psychiatric:        Behavior: Behavior normal.    ED Treatments / Results  Labs (all labs ordered are listed, but only abnormal results are displayed) Labs Reviewed  WET PREP, GENITAL - Abnormal; Notable for the following components:      Result Value   Clue Cells Wet Prep HPF POC  PRESENT (*)    WBC, Wet Prep HPF POC MANY (*)    All other components within normal limits  URINALYSIS, ROUTINE W REFLEX MICROSCOPIC - Abnormal; Notable for the following components:   APPearance HAZY (*)    All other components within normal limits  PREGNANCY, URINE  RPR  HIV ANTIBODY (ROUTINE TESTING W REFLEX)  GC/CHLAMYDIA PROBE AMP () NOT AT [facility]    EKG None  Radiology No results found.  Procedures Procedures (including critical care time)  Medications Ordered  in ED Medications  cefTRIAXone (ROCEPHIN) injection 250 mg (250 mg Intramuscular Given 12/13/18 2135)  azithromycin (ZITHROMAX) tablet 1,000 mg (1,000 mg Oral Given 12/13/18 2134)  lidocaine (PF) (XYLOCAINE) 1 % injection (0.9 mLs  Given 12/13/18 2137)     Initial Impression / Assessment and Plan / ED Course  I have reviewed the triage vital signs and the nursing notes.  Pertinent labs & imaging results that were available during my care of the patient were reviewed by me and considered in my medical decision making (see chart for details).    UTI without evidence of infection Urine pregnancy test negative Wet prep with clue cells and many bacteria, bacterial vaginosis GC chlamydia, HIV and RPR pending  Patient presents with concerns for possible STD and urinary tract infection, no evidence of UTI on urinalysis today she had 1 episode of dysuria upon waking up this morning and has not had any since, no indication for antibiotic for coverage of UTI at this time.  Pelvic examination does have moderate amount of white discharge present.  Patient is concerned for STD as she is sexually active without a condom.  Patient understands that they have GC/Chlamydia cultures pending and that they will need to inform all sexual partners if results return positive. Patient has been treated prophylactically with azithromycin and Rocephin due to patient's history, pelvic exam, and wet prep with increased WBCs.  Presentation not  concerning for PID because hemodynamically stable and no cervical motion tenderness on pelvic exam. Patient has also been treated with Flagyl for Bacterial Vaginosis Flagyl 500 mg twice daily x7 days. Patient has been advised to not drink alcohol while on this medication. Patient to be discharged with instructions to follow up with OBGYN/PCP. Discussed importance of using protection when sexually active.   At this time there does not appear to be any evidence of an acute emergency medical condition and the patient appears stable for discharge with appropriate outpatient follow up. Diagnosis was discussed with patient who verbalizes understanding of care plan and is agreeable to discharge. I have discussed return precautions with patient  who verbalizes understanding of return precautions. Patient encouraged to follow-up with their PCP and OBGYN. All questions answered.   Note: Portions of this report may have been transcribed using voice recognition software. Every effort was made to ensure accuracy; however, inadvertent computerized transcription errors may still be present. Final Clinical Impressions(s) / ED Diagnoses   Final diagnoses:  Concern about STD in female without diagnosis  Bacterial vaginosis    ED Discharge Orders         Ordered    metroNIDAZOLE (FLAGYL) 500 MG tablet  2 times daily     12/13/18 2142           Gari Crown 12/13/18 2147    Julianne Rice, MD 12/13/18 2251

## 2018-12-13 NOTE — ED Triage Notes (Signed)
Complains of vaginal discharge and burning sensation while voiding starting yesterday.

## 2018-12-13 NOTE — ED Notes (Signed)
Pelvic at bedside   

## 2018-12-15 LAB — GC/CHLAMYDIA PROBE AMP (~~LOC~~) NOT AT ARMC
Chlamydia: NEGATIVE
Neisseria Gonorrhea: NEGATIVE

## 2018-12-15 LAB — HIV ANTIBODY (ROUTINE TESTING W REFLEX): HIV Screen 4th Generation wRfx: NONREACTIVE

## 2018-12-15 LAB — RPR: RPR Ser Ql: NONREACTIVE

## 2019-02-24 ENCOUNTER — Telehealth: Payer: Self-pay | Admitting: *Deleted

## 2019-02-24 ENCOUNTER — Other Ambulatory Visit: Payer: Self-pay | Admitting: Advanced Practice Midwife

## 2019-02-24 MED ORDER — METRONIDAZOLE 500 MG PO TABS: 500.0000 mg | ORAL_TABLET | Freq: Two times a day (BID) | ORAL | 0 refills | Status: DC

## 2019-02-24 NOTE — Telephone Encounter (Signed)
done

## 2019-02-24 NOTE — Telephone Encounter (Signed)
Pt called requesting a prescription for BV. She has had it before and would just like it called in. Does not want an office visit.

## 2019-02-28 ENCOUNTER — Other Ambulatory Visit: Payer: Self-pay | Admitting: Advanced Practice Midwife

## 2019-02-28 MED ORDER — METRONIDAZOLE 500 MG PO TABS: 500.0000 mg | ORAL_TABLET | Freq: Two times a day (BID) | ORAL | 0 refills | Status: AC

## 2019-02-28 MED ORDER — METRONIDAZOLE 500 MG PO TABS: 500.0000 mg | ORAL_TABLET | Freq: Two times a day (BID) | ORAL | 0 refills | Status: DC

## 2019-05-23 ENCOUNTER — Encounter (HOSPITAL_COMMUNITY): Payer: Self-pay | Admitting: Emergency Medicine

## 2019-05-23 ENCOUNTER — Emergency Department (HOSPITAL_COMMUNITY)
Admission: EM | Admit: 2019-05-23 | Discharge: 2019-05-23 | Disposition: A | Discharge status: Home / Self Care | Payer: Self-pay | Attending: Emergency Medicine | Admitting: Emergency Medicine

## 2019-05-23 ENCOUNTER — Other Ambulatory Visit: Payer: Self-pay

## 2019-05-23 ENCOUNTER — Emergency Department (HOSPITAL_COMMUNITY): Payer: Self-pay

## 2019-05-23 DIAGNOSIS — N3000 Acute cystitis without hematuria: Secondary | ICD-10-CM | POA: Insufficient documentation

## 2019-05-23 DIAGNOSIS — R519 Headache, unspecified: Secondary | ICD-10-CM | POA: Insufficient documentation

## 2019-05-23 LAB — URINALYSIS, ROUTINE W REFLEX MICROSCOPIC
Bilirubin Urine: NEGATIVE
Glucose, UA: NEGATIVE mg/dL
Hgb urine dipstick: NEGATIVE
Ketones, ur: NEGATIVE mg/dL
Nitrite: NEGATIVE
Protein, ur: NEGATIVE mg/dL
Specific Gravity, Urine: 1.019 (ref 1.005–1.030)
pH: 6 (ref 5.0–8.0)

## 2019-05-23 LAB — PREGNANCY, URINE: Preg Test, Ur: NEGATIVE

## 2019-05-23 MED ORDER — CEPHALEXIN 500 MG PO CAPS: ORAL_CAPSULE | ORAL | 0 refills | Status: DC

## 2019-05-23 MED ORDER — PROMETHAZINE HCL 25 MG/ML IJ SOLN
25.0000 mg | Freq: Once | INTRAMUSCULAR | Status: AC
  Administered 2019-05-23: 23:00:00 25 mg via INTRAVENOUS
  Filled 2019-05-23: qty 1

## 2019-05-23 MED ORDER — KETOROLAC TROMETHAMINE 60 MG/2ML IM SOLN
60.0000 mg | Freq: Once | INTRAMUSCULAR | Status: AC
  Administered 2019-05-23: 60 mg via INTRAMUSCULAR
  Filled 2019-05-23: qty 2

## 2019-05-23 NOTE — ED Triage Notes (Signed)
Pt c/o headache with n/v, and abd cramping since Friday.

## 2019-05-23 NOTE — ED Provider Notes (Signed)
[facility] EMERGENCY DEPARTMENT Provider Note   CSN: [id number] Arrival date & time: 05/23/19  1845     History Chief Complaint  Patient presents with  . Headache    Courtney Zhang is a 23 y.o. female.  Patient is a 23 year old female with history of polycystic ovaries and prior childbirth.  She presents today for evaluation of headache.  Patient describes throbbing to both temples that has worsened over the past several days.  She reports blurry vision and nausea, but no vomiting.  She denies any injury or trauma.  She has been taking over-the-counter medications with little relief.  Patient also concerned about the possibility of pregnancy.  She reports headaches during a prior pregnancy.  The history is provided by the patient.  Headache Pain location:  R temporal and L temporal Quality: Throbbing. Radiates to:  Does not radiate Onset quality:  Gradual Duration:  3 days Timing:  Constant Progression:  Worsening Chronicity:  New Relieved by:  Nothing Worsened by:  Nothing Ineffective treatments:  Acetaminophen and NSAIDs      Past Medical History:  Diagnosis Date  . Abdominal cramping 07/14/2014  . Amenorrhea 07/14/2014  . Chlamydia   . Contraceptive education 11/07/2013  . GERD (gastroesophageal reflux disease)   . Herpes genitalia   . PCO (polycystic ovaries) 08/02/2014  . Pelvic pain in female 07/14/2014  . Positive pregnancy test 08/02/2014    Patient Active Problem List   Diagnosis Date Noted  . 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 Previous cesarean section 03/06/2017  . PCO (polycystic ovaries) 08/02/2014  . Pelvic pain in female 07/14/2014  . Amenorrhea 07/14/2014    Past Surgical History:  Procedure Laterality Date  . CESAREAN SECTION    . CHOLECYSTECTOMY  10/01/2016  . DILATION AND CURETTAGE OF UTERUS  12/13/2011?  . INDUCED ABORTION  12/05/2017     OB History    Gravida  4   Para  2   Term  2   Preterm      AB  2   Living   2     SAB  1   TAB  1   Ectopic      Multiple  0   Live Births  2           Family History  Problem Relation Age of Onset  . Asthma Brother   . Diabetes Maternal Grandmother   . Hypertension Maternal Grandmother   . Diabetes Maternal Grandfather   . Diabetes Paternal Grandmother   . Asthma Brother   . Asthma Brother   . Asthma Brother     Social History   Tobacco Use  . Smoking status: Never Smoker  . Smokeless tobacco: Never Used  Substance Use Topics  . Alcohol use: Yes  . Drug use: Yes    Types: Marijuana    Home Medications Prior to Admission medications   Medication Sig Start Date End Date Taking? Authorizing Provider  ibuprofen (ADVIL) 200 MG tablet Take 200 mg by mouth every 6 (six) hours as needed for mild pain or moderate pain.   Yes [provider]    Allergies    Patient has no known allergies.  Review of Systems   Review of Systems  Neurological: Positive for headaches.  All other systems reviewed and are negative.   Physical Exam Updated Vital Signs BP 129/81   Pulse 72   Temp 98.4 F (36.9 C)   Resp 18   Ht 5'  8" (1.727 m)   Wt 100.7 kg   LMP 05/04/2019   SpO2 100%   BMI 33.75 kg/m   Physical Exam Vitals and nursing note reviewed.  Constitutional:      General: She is not in acute distress.    Appearance: She is well-developed. She is not diaphoretic.  HENT:     Head: Normocephalic and atraumatic.  Eyes:     General: No visual field deficit.    Extraocular Movements: Extraocular movements intact.     Right eye: Normal extraocular motion.     Left eye: Normal extraocular motion.     Pupils: Pupils are equal, round, and reactive to light.  Cardiovascular:     Rate and Rhythm: Normal rate and regular rhythm.     Heart sounds: No murmur. No friction rub. No gallop.   Pulmonary:     Effort: Pulmonary effort is normal. No respiratory distress.     Breath sounds: Normal breath sounds. No wheezing.  Abdominal:      General: Bowel sounds are normal. There is no distension.     Palpations: Abdomen is soft.     Tenderness: There is no abdominal tenderness.  Musculoskeletal:        General: Normal range of motion.     Cervical back: Normal range of motion and neck supple.  Skin:    General: Skin is warm and dry.  Neurological:     Mental Status: She is alert and oriented to person, place, and time.     Cranial Nerves: No cranial nerve deficit or facial asymmetry.     Sensory: No sensory deficit.     Motor: No weakness.     Coordination: Coordination normal.     ED Results / Procedures / Treatments   Labs (all labs ordered are listed, but only abnormal results are displayed) Labs Reviewed  PREGNANCY, URINE  URINALYSIS, ROUTINE W REFLEX MICROSCOPIC    EKG None  Radiology No results found.  Procedures Procedures (including critical care time)  Medications Ordered in ED Medications  ketorolac (TORADOL) injection 60 mg (has no administration in time range)  promethazine (PHENERGAN) injection 25 mg (has no administration in time range)    ED Course  I have reviewed the triage vital signs and the nursing notes.  Pertinent labs & imaging results that were available during my care of the patient were reviewed by me and considered in my medical decision making (see chart for details).  Patient with headache.  She is neurologically intact and head CT is negative.  She is feeling better after Toradol and Phenergan.  She does have evidence of a mild UTI.  She will be treated with Keflex and is to follow-up as needed.  MDM Rules/Calculators/A&P  Final Clinical Impression(s) / ED Diagnoses Final diagnoses:  None    Rx / DC Orders ED Discharge Orders    None       Geoffery Lyons, MD 05/24/19 1511

## 2019-05-23 NOTE — Discharge Instructions (Addendum)
Keflex as prescribed.  Tylenol 1000 mg rotated with ibuprofen 600 mg every 4 hours as needed for pain.  Follow-up with primary doctor if symptoms or not improving in the next few days.

## 2019-06-10 ENCOUNTER — Encounter (HOSPITAL_COMMUNITY): Payer: Self-pay | Admitting: *Deleted

## 2019-06-10 ENCOUNTER — Emergency Department (HOSPITAL_COMMUNITY): Payer: Self-pay

## 2019-06-10 ENCOUNTER — Emergency Department (HOSPITAL_COMMUNITY)
Admission: EM | Admit: 2019-06-10 | Discharge: 2019-06-10 | Disposition: A | Discharge status: Home / Self Care | Payer: Self-pay | Attending: Emergency Medicine | Admitting: Emergency Medicine

## 2019-06-10 ENCOUNTER — Other Ambulatory Visit: Payer: Self-pay

## 2019-06-10 DIAGNOSIS — R079 Chest pain, unspecified: Secondary | ICD-10-CM

## 2019-06-10 DIAGNOSIS — R109 Unspecified abdominal pain: Secondary | ICD-10-CM | POA: Insufficient documentation

## 2019-06-10 DIAGNOSIS — R112 Nausea with vomiting, unspecified: Secondary | ICD-10-CM | POA: Insufficient documentation

## 2019-06-10 DIAGNOSIS — Z20822 Contact with and (suspected) exposure to covid-19: Secondary | ICD-10-CM | POA: Insufficient documentation

## 2019-06-10 LAB — CBC WITH DIFFERENTIAL/PLATELET
Abs Immature Granulocytes: 0.05 10*3/uL (ref 0.00–0.07)
Basophils Absolute: 0 10*3/uL (ref 0.0–0.1)
Basophils Relative: 0 %
Eosinophils Absolute: 0 10*3/uL (ref 0.0–0.5)
Eosinophils Relative: 0 %
HCT: 42 % (ref 36.0–46.0)
Hemoglobin: 13.1 g/dL (ref 12.0–15.0)
Immature Granulocytes: 0 %
Lymphocytes Relative: 13 %
Lymphs Abs: 1.6 10*3/uL (ref 0.7–4.0)
MCH: 27 pg (ref 26.0–34.0)
MCHC: 31.2 g/dL (ref 30.0–36.0)
MCV: 86.6 fL (ref 80.0–100.0)
Monocytes Absolute: 0.6 10*3/uL (ref 0.1–1.0)
Monocytes Relative: 5 %
Neutro Abs: 9.9 10*3/uL — ABNORMAL HIGH (ref 1.7–7.7)
Neutrophils Relative %: 82 %
Platelets: 295 10*3/uL (ref 150–400)
RBC: 4.85 MIL/uL (ref 3.87–5.11)
RDW: 14.3 % (ref 11.5–15.5)
WBC: 12.1 10*3/uL — ABNORMAL HIGH (ref 4.0–10.5)
nRBC: 0 % (ref 0.0–0.2)

## 2019-06-10 LAB — COMPREHENSIVE METABOLIC PANEL
ALT: 16 U/L (ref 0–44)
AST: 19 U/L (ref 15–41)
Albumin: 4.7 g/dL (ref 3.5–5.0)
Alkaline Phosphatase: 80 U/L (ref 38–126)
Anion gap: 12 (ref 5–15)
BUN: 10 mg/dL (ref 6–20)
CO2: 27 mmol/L (ref 22–32)
Calcium: 9.4 mg/dL (ref 8.9–10.3)
Chloride: 100 mmol/L (ref 98–111)
Creatinine, Ser: 0.64 mg/dL (ref 0.44–1.00)
GFR calc Af Amer: >60 mL/min (ref >60)
GFR calc non Af Amer: >60 mL/min (ref >60)
Glucose, Bld: 116 mg/dL — ABNORMAL HIGH (ref 70–99)
Potassium: 3.2 mmol/L — ABNORMAL LOW (ref 3.5–5.1)
Sodium: 139 mmol/L (ref 135–145)
Total Bilirubin: 0.9 mg/dL (ref 0.3–1.2)
Total Protein: 8.5 g/dL — ABNORMAL HIGH (ref 6.5–8.1)

## 2019-06-10 LAB — URINALYSIS, ROUTINE W REFLEX MICROSCOPIC
Bacteria, UA: NONE SEEN
Bilirubin Urine: NEGATIVE
Glucose, UA: NEGATIVE mg/dL
Hgb urine dipstick: NEGATIVE
Ketones, ur: 20 mg/dL — AB
Leukocytes,Ua: NEGATIVE
Nitrite: NEGATIVE
Protein, ur: 100 mg/dL — AB
Specific Gravity, Urine: 1.032 — ABNORMAL HIGH (ref 1.005–1.030)
pH: 6 (ref 5.0–8.0)

## 2019-06-10 LAB — POC URINE PREG, ED: Preg Test, Ur: NEGATIVE

## 2019-06-10 LAB — LIPASE, BLOOD: Lipase: 16 U/L (ref 11–51)

## 2019-06-10 MED ORDER — DICYCLOMINE HCL 20 MG PO TABS: 20.0000 mg | ORAL_TABLET | Freq: Two times a day (BID) | ORAL | 0 refills | Status: DC

## 2019-06-10 MED ORDER — ONDANSETRON HCL 4 MG/2ML IJ SOLN
4.0000 mg | Freq: Once | INTRAMUSCULAR | Status: AC
  Administered 2019-06-10: 4 mg via INTRAVENOUS
  Filled 2019-06-10: qty 2

## 2019-06-10 MED ORDER — KETOROLAC TROMETHAMINE 30 MG/ML IJ SOLN
15.0000 mg | Freq: Once | INTRAMUSCULAR | Status: AC
  Administered 2019-06-10: 15 mg via INTRAVENOUS
  Filled 2019-06-10: qty 1

## 2019-06-10 MED ORDER — SODIUM CHLORIDE 0.9 % IV BOLUS
1000.0000 mL | Freq: Once | INTRAVENOUS | Status: AC
  Administered 2019-06-10: 1000 mL via INTRAVENOUS

## 2019-06-10 MED ORDER — ONDANSETRON 4 MG PO TBDP: 4.0000 mg | ORAL_TABLET | Freq: Three times a day (TID) | ORAL | 0 refills | Status: DC | PRN

## 2019-06-10 MED ORDER — SODIUM CHLORIDE 0.9 % IV BOLUS
1000.0000 mL | Freq: Once | INTRAVENOUS | Status: AC
  Administered 2019-06-10: 14:00:00 1000 mL via INTRAVENOUS

## 2019-06-10 MED ORDER — POTASSIUM CHLORIDE CRYS ER 20 MEQ PO TBCR
40.0000 meq | EXTENDED_RELEASE_TABLET | Freq: Once | ORAL | Status: AC
  Administered 2019-06-10: 12:00:00 40 meq via ORAL
  Filled 2019-06-10: qty 2

## 2019-06-10 MED ORDER — IOHEXOL 300 MG/ML  SOLN
100.0000 mL | Freq: Once | INTRAMUSCULAR | Status: AC | PRN
  Administered 2019-06-10: 100 mL via INTRAVENOUS

## 2019-06-10 MED ORDER — MORPHINE SULFATE (PF) 4 MG/ML IV SOLN
4.0000 mg | Freq: Once | INTRAVENOUS | Status: AC
  Administered 2019-06-10: 14:00:00 4 mg via INTRAVENOUS
  Filled 2019-06-10: qty 1

## 2019-06-10 NOTE — ED Provider Notes (Signed)
This patient is a 24 year old female, she states that she occasionally drinks alcohol, smokes marijuana, has not had any marijuana in 3 days since she started having some nausea vomiting and chest pain.  Chest pain is left of center, constant, does not seem to get worse with deep breathing.  She states the pain starts in the epigastrium and radiates up into the left side of the chest.  No diarrhea, she has had multiple episodes of nausea and vomiting and on my exam has a normal heart rate, normal peripheral pulses and normal lung exam.  She is not limited in her breathing and has no pleurisy.  There is no asymmetry or edema of the legs.  EKG totally unremarkable and nonischemic.  Labs pending, the patient is low risk for pulmonary embolism or coronary disease.   EKG Interpretation  Date/Time:  Friday June 10 2019 10:06:48 EST Ventricular Rate:  68 PR Interval:    QRS Duration: 91 QT Interval:  417 QTC Calculation: 444 R Axis:   65 Text Interpretation: Sinus rhythm Normal ECG No old tracing to compare Confirmed by Eber Hong ([id number]) on 06/10/2019 10:15:25 AM       Medical screening examination/treatment/procedure(s) were conducted as a shared visit with non-physician practitioner(s) and myself.  I personally evaluated the patient during the encounter.  Clinical Impression:   Final diagnoses:  Non-intractable vomiting with nausea, unspecified vomiting type         Eber Hong, MD 06/20/19 1740

## 2019-06-10 NOTE — ED Provider Notes (Signed)
[facility] EMERGENCY DEPARTMENT Provider Note   CSN: [id number] Arrival date & time: 06/10/19  4656     History Chief Complaint  Patient presents with  . Chest Pain  . Emesis    Courtney Zhang is a 24 y.o. female.  HPI      Courtney Zhang is a 24 y.o. female, with a history of PCOS, presenting to the ED with nausea and vomiting beginning 2 days ago.  She was in her normal state of health before the symptoms began.  She endorses several daily episodes of nonbilious nonbloody emesis with the last episode this morning shortly prior to arrival.  She initially had some soft stool on first day of symptoms, but her last bowel movement was yesterday and it was normal at that time. She also complains of chest discomfort rising after vomiting, central and left chest, described as a pressure, lasting for approximately 30 minutes after vomiting, 7/10, nonradiating. Patient denies contact with known or suspected COVID-19 patients. Currently menstruating. Denies history of PE/DVT, extended immobilization, recent surgeries, recent trauma. Denies fever/chills, abdominal pain, cough, shortness of breath, hematochezia/melena, syncope, urinary symptoms, lower extremity edema/pain, flank/back pain, or any other complaints.   Past Medical History:  Diagnosis Date  . Abdominal cramping 07/14/2014  . Amenorrhea 07/14/2014  . Chlamydia   . Contraceptive education 11/07/2013  . GERD (gastroesophageal reflux disease)   . Herpes genitalia   . PCO (polycystic ovaries) 08/02/2014  . Pelvic pain in female 07/14/2014  . Positive pregnancy test 08/02/2014    Patient Active Problem List   Diagnosis Date Noted  . 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 Previous cesarean section 03/06/2017  . PCO (polycystic ovaries) 08/02/2014  . Pelvic pain in female 07/14/2014  . Amenorrhea 07/14/2014    Past Surgical History:  Procedure Laterality Date  . CESAREAN SECTION    . CHOLECYSTECTOMY  10/01/2016    . DILATION AND CURETTAGE OF UTERUS  12/13/2011?  . INDUCED ABORTION  12/05/2017     OB History    Gravida  4   Para  2   Term  2   Preterm      AB  2   Living  2     SAB  1   TAB  1   Ectopic      Multiple  0   Live Births  2           Family History  Problem Relation Age of Onset  . Asthma Brother   . Diabetes Maternal Grandmother   . Hypertension Maternal Grandmother   . Diabetes Maternal Grandfather   . Diabetes Paternal Grandmother   . Asthma Brother   . Asthma Brother   . Asthma Brother     Social History   Tobacco Use  . Smoking status: Never Smoker  . Smokeless tobacco: Never Used  Substance Use Topics  . Alcohol use: Yes  . Drug use: Yes    Types: Marijuana    Home Medications Prior to Admission medications   Medication Sig Start Date End Date Taking? Authorizing Provider  dicyclomine (BENTYL) 20 MG tablet Take 1 tablet (20 mg total) by mouth 2 (two) times daily. 06/10/19   Sehar Sedano C, PA-C  ondansetron (ZOFRAN ODT) 4 MG disintegrating tablet Take 1 tablet (4 mg total) by mouth every 8 (eight) hours as needed for nausea or vomiting. 06/10/19   Malayshia All, Hillard Danker, PA-C    Allergies    Patient has no known  allergies.  Review of Systems   Review of Systems  Constitutional: Positive for fatigue. Negative for chills and fever.  Respiratory: Negative for cough and shortness of breath.   Cardiovascular: Positive for chest pain. Negative for leg swelling.  Gastrointestinal: Positive for nausea and vomiting. Negative for abdominal pain and diarrhea (none currently).  Genitourinary: Negative for dysuria, flank pain, hematuria and vaginal discharge.  Musculoskeletal: Negative for back pain.  Neurological: Positive for weakness (generalized). Negative for dizziness, syncope and numbness.  All other systems reviewed and are negative.   Physical Exam Updated Vital Signs BP 124/86   Pulse 65   Temp 98.1 F (36.7 C)   Resp 20   Ht 5\' 8"  (1.727 m)    Wt 104.3 kg   SpO2 100%   BMI 34.97 kg/m   Physical Exam Vitals and nursing note reviewed.  Constitutional:      General: She is not in acute distress.    Appearance: She is well-developed. She is not diaphoretic.  HENT:     Head: Normocephalic and atraumatic.     Mouth/Throat:     Mouth: Mucous membranes are moist.     Pharynx: Oropharynx is clear.  Eyes:     Conjunctiva/sclera: Conjunctivae normal.  Cardiovascular:     Rate and Rhythm: Normal rate and regular rhythm.     Pulses: Normal pulses.          Radial pulses are 2+ on the right side and 2+ on the left side.       Posterior tibial pulses are 2+ on the right side and 2+ on the left side.     Heart sounds: Normal heart sounds.     Comments: Tactile temperature in the extremities appropriate and equal bilaterally. Pulmonary:     Effort: Pulmonary effort is normal. No respiratory distress.     Breath sounds: Normal breath sounds.  Abdominal:     Palpations: Abdomen is soft.     Tenderness: There is no abdominal tenderness. There is no guarding.  Musculoskeletal:     Cervical back: Neck supple.     Right lower leg: No edema.     Left lower leg: No edema.  Lymphadenopathy:     Cervical: No cervical adenopathy.  Skin:    General: Skin is warm and dry.  Neurological:     Mental Status: She is alert.  Psychiatric:        Mood and Affect: Mood and affect normal.        Speech: Speech normal.        Behavior: Behavior normal.     ED Results / Procedures / Treatments   Labs (all labs ordered are listed, but only abnormal results are displayed) Labs Reviewed  COMPREHENSIVE METABOLIC PANEL - Abnormal; Notable for the following components:      Result Value   Potassium 3.2 (*)    Glucose, Bld 116 (*)    Total Protein 8.5 (*)    All other components within normal limits  CBC WITH DIFFERENTIAL/PLATELET - Abnormal; Notable for the following components:   WBC 12.1 (*)    Neutro Abs 9.9 (*)    All other components  within normal limits  URINALYSIS, ROUTINE W REFLEX MICROSCOPIC - Abnormal; Notable for the following components:   Specific Gravity, Urine 1.032 (*)    Ketones, ur 20 (*)    Protein, ur 100 (*)    All other components within normal limits  NOVEL CORONAVIRUS, NAA (HOSP ORDER, SEND-OUT TO REF  LAB; TAT 18-24 HRS)  LIPASE, BLOOD  POC URINE PREG, ED    EKG EKG Interpretation  Date/Time:  Friday June 10 2019 10:06:48 EST Ventricular Rate:  68 PR Interval:    QRS Duration: 91 QT Interval:  417 QTC Calculation: 444 R Axis:   65 Text Interpretation: Sinus rhythm Normal ECG No old tracing to compare Confirmed by Eber Hong ([id number]) on 06/10/2019 10:15:25 AM   Radiology CT ABDOMEN PELVIS W CONTRAST  Result Date: 06/10/2019 CLINICAL DATA:  Right lower quadrant pain for 2 days EXAM: CT ABDOMEN AND PELVIS WITH CONTRAST TECHNIQUE: Multidetector CT imaging of the abdomen and pelvis was performed using the standard protocol following bolus administration of intravenous contrast. CONTRAST:  OMNIPAQUE IOHEXOL 300 MG/ML  SOLN COMPARISON:  None. FINDINGS: Lower chest: No acute abnormality. Hepatobiliary: No focal liver abnormality is seen. Status post cholecystectomy. No biliary dilatation. Pancreas: Unremarkable. No pancreatic ductal dilatation or surrounding inflammatory changes. Spleen: Normal in size without focal abnormality. Adrenals/Urinary Tract: Adrenal glands are within normal limits. Kidneys are well visualized bilaterally. No renal calculi or obstructive changes are noted. Bladder is decompressed. Stomach/Bowel: The appendix is well visualized and within normal limits. Colon is decompressed. No obstructive or inflammatory changes are seen. Small bowel is within normal limits. Vascular/Lymphatic: No significant vascular findings are present. No enlarged abdominal or pelvic lymph nodes. Reproductive: Uterus and bilateral adnexa are unremarkable. Other: No abdominal wall hernia or abnormality.  No abdominopelvic ascites. Musculoskeletal: No acute or significant osseous findings. IMPRESSION: No acute abnormality noted. Electronically Signed   By: Alcide Clever M.D.   On: 06/10/2019 13:46   DG Chest Port 1 View  Result Date: 06/10/2019 CLINICAL DATA:  Chest pain, vomiting EXAM: PORTABLE CHEST 1 VIEW COMPARISON:  None. FINDINGS: The heart size and mediastinal contours are within normal limits. No focal airspace consolidation, pleural effusion, or pneumothorax. The visualized skeletal structures are unremarkable. IMPRESSION: No acute cardiopulmonary findings. Electronically Signed   By: Duanne Guess D.O.   On: 06/10/2019 11:04    Procedures Procedures (including critical care time)  Medications Ordered in ED Medications  sodium chloride 0.9 % bolus 1,000 mL (0 mLs Intravenous Stopped 06/10/19 1330)  ondansetron (ZOFRAN) injection 4 mg (4 mg Intravenous Given 06/10/19 1100)  ketorolac (TORADOL) 30 MG/ML injection 15 mg (15 mg Intravenous Given 06/10/19 1136)  potassium chloride SA (KLOR-CON) CR tablet 40 mEq (40 mEq Oral Given 06/10/19 1135)  morphine 4 MG/ML injection 4 mg (4 mg Intravenous Given 06/10/19 1339)  sodium chloride 0.9 % bolus 1,000 mL (0 mLs Intravenous Stopped 06/10/19 1425)  iohexol (OMNIPAQUE) 300 MG/ML solution 100 mL (100 mLs Intravenous Contrast Given 06/10/19 1326)    ED Course  I have reviewed the triage vital signs and the nursing notes.  Pertinent labs & imaging results that were available during my care of the patient were reviewed by me and considered in my medical decision making (see chart for details).  Clinical Course as of Jun 10 1511  Fri Jun 10, 2019  1115 Patient's nausea has improved.   [SJ]  1119 Patient now complaining of primarily abdominal pain.  She indicates her pain feels like a pressure and started periumbilical, has migrated to the right side of the abdomen, rated 8/10. Chest pain and nausea have resolved. She is tender in the right lower  quadrant.   [SJ]  1406 Reviewed CT results with patient.  Patient's pain resolved.  She has not had recurrence of her nausea and vomiting since  ED arrival.   [SJ]    Clinical Course User Index [SJ] Mindie Rawdon, Hillard Danker, PA-C   MDM Rules/Calculators/A&P                       Patient presents with nausea, vomiting, and chest discomfort after vomiting. Patient is nontoxic appearing, afebrile, not tachycardic, not tachypneic, not hypotensive, maintains excellent SPO2 on room air, and is in no apparent distress.   Low suspicion for ACS. No high risk/suspicious features; no exertional chest pain, vomiting, diaphoresis, or radiation. EKG without evidence of acute ischemia or pathologic/symptomatic arrhythmia. Wells criteria score is 0, indicating low risk for PE.  PERC negative. Dissection was considered, but thought less likely base on: History and description of the pain are not suggestive, patient is not ill-appearing, lack of risk factors, equal bilateral pulses, lack of neurologic deficits, no widened mediastinum on chest x-ray. Mild hypokalemia noted, suspected to be due to patient's vomiting.  This was addressed here in the ED, but could also be further addressed at home through dietary supplementation. CT abdomen/pelvis without acute abnormality.  Coronavirus test pending. Tolerating PO prior to discharge. The patient was given instructions for home care as well as return precautions. Patient voices understanding of these instructions, accepts the plan, and is comfortable with discharge.   Vitals:   06/10/19 1130 06/10/19 1200 06/10/19 1300 06/10/19 1400  BP:    117/76  Pulse: 62 (!) 50 63 65  Resp: 15 14 14 12   Temp:      SpO2: 100% 100% 100% 100%  Weight:      Height:        Courtney Zhang was evaluated in Emergency Department on 06/10/2019 for the symptoms described in the history of present illness. She was evaluated in the context of the global COVID-19 pandemic, which necessitated  consideration that the patient might be at risk for infection with the SARS-CoV-2 virus that causes COVID-19. Institutional protocols and algorithms that pertain to the evaluation of patients at risk for COVID-19 are in a state of rapid change based on information released by regulatory bodies including the CDC and federal and state organizations. These policies and algorithms were followed during the patient's care in the ED.  Final Clinical Impression(s) / ED Diagnoses Final diagnoses:  Non-intractable vomiting with nausea, unspecified vomiting type    Rx / DC Orders ED Discharge Orders         Ordered    ondansetron (ZOFRAN ODT) 4 MG disintegrating tablet  Every 8 hours PRN     06/10/19 1407    dicyclomine (BENTYL) 20 MG tablet  2 times daily     06/10/19 [address], PA-C 06/10/19 1517    08/08/19, MD 06/20/19 [phone]

## 2019-06-10 NOTE — ED Triage Notes (Signed)
C/i chest pain with vomiting for 2 days

## 2019-06-10 NOTE — Discharge Instructions (Addendum)
Test Results for COVID-19 pending  You have a test pending for COVID-19.  Results typically return within about 48 hours.  Be sure to check MyChart for updated results.  We recommend isolating yourself until results are received.  Patients who have symptoms consistent with COVID-19 should self isolated for: At least 3 days (72 hours) have passed since recovery, defined as resolution of fever without the use of fever reducing medications and improvement in respiratory symptoms (e.g., cough, shortness of breath), and At least 7 days have passed since symptoms first appeared.  If you have no symptoms, but your test returns positive, recommend isolating for at least 10 days.   Nausea, Vomiting, and Abdominal Pain  Hand washing: Wash your hands throughout the day, but especially before and after touching the face, using the restroom, sneezing, coughing, or touching surfaces that have been coughed or sneezed upon. Hydration: Symptoms will be intensified and complicated by dehydration. Dehydration can also extend the duration of symptoms. Drink plenty of fluids and get plenty of rest. You should be drinking at least half a liter of water an hour to stay hydrated. Electrolyte drinks (ex. Gatorade, Powerade, Pedialyte) are also encouraged. You should be drinking enough fluids to make your urine light yellow, almost clear. If this is not the case, you are not drinking enough water. Please note that some of the treatments indicated below will not be effective if you are not adequately hydrated. Diet: Please concentrate on hydration, however, you may introduce food slowly.  Start with a clear liquid diet, progressed to a full liquid diet, and then bland solids as you are able. Pain or fever: Ibuprofen, Naproxen, or Tylenol for pain or fever.  Nausea/vomiting: Use the ondansetron (generic for Zofran) for nausea or vomiting.  This medication may not prevent all vomiting or nausea, but can help facilitate better  hydration. Things that can help with nausea/vomiting also include peppermint/menthol candies, vitamin B12, and ginger. Bentyl: This medication is what is known as an antispasmodic and is intended to help reduce abdominal discomfort. Follow-up: Follow-up with a primary care provider on this matter. Return: Return should you develop a fever, bloody diarrhea, increased abdominal pain, uncontrolled vomiting, or any other major concerns.  For prescription assistance, may try using prescription discount sites or apps, such as goodrx.com

## 2019-06-11 ENCOUNTER — Encounter (HOSPITAL_COMMUNITY): Payer: Self-pay | Admitting: Emergency Medicine

## 2019-06-11 ENCOUNTER — Emergency Department (HOSPITAL_COMMUNITY)
Admission: EM | Admit: 2019-06-11 | Discharge: 2019-06-11 | Disposition: A | Discharge status: Home / Self Care | Payer: Self-pay | Attending: Emergency Medicine | Admitting: Emergency Medicine

## 2019-06-11 DIAGNOSIS — Z5321 Procedure and treatment not carried out due to patient leaving prior to being seen by health care provider: Secondary | ICD-10-CM | POA: Insufficient documentation

## 2019-06-11 DIAGNOSIS — R1110 Vomiting, unspecified: Secondary | ICD-10-CM | POA: Insufficient documentation

## 2019-06-11 DIAGNOSIS — R0789 Other chest pain: Secondary | ICD-10-CM | POA: Insufficient documentation

## 2019-06-11 LAB — NOVEL CORONAVIRUS, NAA (HOSP ORDER, SEND-OUT TO REF LAB; TAT 18-24 HRS): SARS-CoV-2, NAA: NOT DETECTED

## 2019-06-11 NOTE — ED Triage Notes (Signed)
Did not get her prescriptions filled.

## 2019-06-11 NOTE — ED Triage Notes (Signed)
Pt states pain under both ribs x 2 days with vomiting. Seen yesterday for same and no better.

## 2019-08-03 ENCOUNTER — Emergency Department (HOSPITAL_COMMUNITY): Payer: Medicaid Other

## 2019-08-03 ENCOUNTER — Emergency Department (HOSPITAL_COMMUNITY)
Admission: EM | Admit: 2019-08-03 | Discharge: 2019-08-03 | Disposition: A | Discharge status: Home / Self Care | Payer: Medicaid Other | Attending: Emergency Medicine | Admitting: Emergency Medicine

## 2019-08-03 ENCOUNTER — Encounter (HOSPITAL_COMMUNITY): Payer: Self-pay | Admitting: Emergency Medicine

## 2019-08-03 ENCOUNTER — Other Ambulatory Visit: Payer: Self-pay

## 2019-08-03 DIAGNOSIS — R1032 Left lower quadrant pain: Secondary | ICD-10-CM | POA: Diagnosis present

## 2019-08-03 DIAGNOSIS — Z79899 Other long term (current) drug therapy: Secondary | ICD-10-CM | POA: Diagnosis not present

## 2019-08-03 DIAGNOSIS — B9689 Other specified bacterial agents as the cause of diseases classified elsewhere: Secondary | ICD-10-CM

## 2019-08-03 DIAGNOSIS — R05 Cough: Secondary | ICD-10-CM | POA: Diagnosis not present

## 2019-08-03 DIAGNOSIS — N760 Acute vaginitis: Secondary | ICD-10-CM | POA: Insufficient documentation

## 2019-08-03 DIAGNOSIS — N390 Urinary tract infection, site not specified: Secondary | ICD-10-CM | POA: Diagnosis not present

## 2019-08-03 DIAGNOSIS — Z20822 Contact with and (suspected) exposure to covid-19: Secondary | ICD-10-CM | POA: Diagnosis not present

## 2019-08-03 DIAGNOSIS — R102 Pelvic and perineal pain: Secondary | ICD-10-CM

## 2019-08-03 DIAGNOSIS — Z113 Encounter for screening for infections with a predominantly sexual mode of transmission: Secondary | ICD-10-CM | POA: Diagnosis not present

## 2019-08-03 LAB — COMPREHENSIVE METABOLIC PANEL
ALT: 14 U/L (ref 0–44)
AST: 13 U/L — ABNORMAL LOW (ref 15–41)
Albumin: 4.1 g/dL (ref 3.5–5.0)
Alkaline Phosphatase: 63 U/L (ref 38–126)
Anion gap: 7 (ref 5–15)
BUN: 9 mg/dL (ref 6–20)
CO2: 25 mmol/L (ref 22–32)
Calcium: 8.8 mg/dL — ABNORMAL LOW (ref 8.9–10.3)
Chloride: 104 mmol/L (ref 98–111)
Creatinine, Ser: 0.52 mg/dL (ref 0.44–1.00)
GFR calc Af Amer: >60 mL/min (ref >60)
GFR calc non Af Amer: >60 mL/min (ref >60)
Glucose, Bld: 90 mg/dL (ref 70–99)
Potassium: 4 mmol/L (ref 3.5–5.1)
Sodium: 136 mmol/L (ref 135–145)
Total Bilirubin: 0.5 mg/dL (ref 0.3–1.2)
Total Protein: 7.1 g/dL (ref 6.5–8.1)

## 2019-08-03 LAB — WET PREP, GENITAL
Sperm: NONE SEEN
Trich, Wet Prep: NONE SEEN
Yeast Wet Prep HPF POC: NONE SEEN

## 2019-08-03 LAB — CBC
HCT: 40 % (ref 36.0–46.0)
Hemoglobin: 12.4 g/dL (ref 12.0–15.0)
MCH: 27.1 pg (ref 26.0–34.0)
MCHC: 31 g/dL (ref 30.0–36.0)
MCV: 87.3 fL (ref 80.0–100.0)
Platelets: 250 10*3/uL (ref 150–400)
RBC: 4.58 MIL/uL (ref 3.87–5.11)
RDW: 14.5 % (ref 11.5–15.5)
WBC: 8 10*3/uL (ref 4.0–10.5)
nRBC: 0 % (ref 0.0–0.2)

## 2019-08-03 LAB — URINALYSIS, ROUTINE W REFLEX MICROSCOPIC
Bacteria, UA: NONE SEEN
Bilirubin Urine: NEGATIVE
Glucose, UA: NEGATIVE mg/dL
Hgb urine dipstick: NEGATIVE
Ketones, ur: NEGATIVE mg/dL
Nitrite: NEGATIVE
Protein, ur: NEGATIVE mg/dL
Specific Gravity, Urine: 1.017 (ref 1.005–1.030)
pH: 6 (ref 5.0–8.0)

## 2019-08-03 LAB — SARS CORONAVIRUS 2 (TAT 6-24 HRS): SARS Coronavirus 2: NEGATIVE

## 2019-08-03 LAB — PREGNANCY, URINE: Preg Test, Ur: NEGATIVE

## 2019-08-03 MED ORDER — METRONIDAZOLE 500 MG PO TABS: 500.0000 mg | ORAL_TABLET | Freq: Two times a day (BID) | ORAL | 0 refills | Status: DC

## 2019-08-03 MED ORDER — CEPHALEXIN 500 MG PO CAPS: 1000.0000 mg | ORAL_CAPSULE | Freq: Two times a day (BID) | ORAL | 0 refills | Status: DC

## 2019-08-03 MED ORDER — CEPHALEXIN 500 MG PO CAPS
500.0000 mg | ORAL_CAPSULE | Freq: Once | ORAL | Status: AC
  Administered 2019-08-03: 15:00:00 500 mg via ORAL
  Filled 2019-08-03: qty 1

## 2019-08-03 NOTE — Discharge Instructions (Addendum)
It was our pleasure to provide your ER care today - we hope that you feel better.  Take antibiotics as prescribed. Do not drink any alcohol when taking these antibiotics.   Take acetaminophen or ibuprofen as need.   Follow up with primary care doctor in 1 week if symptoms fail to improve/resolve.  Return to ER if worse, new symptoms, new, worsening or severe abdominal pain, persistent vomiting, high fevers, or other concern.

## 2019-08-03 NOTE — ED Triage Notes (Signed)
Patient reports LLQ pain since having sex a week ago. Denies urinary symptoms. Last BM yesterday. White thick vaginal discharge. Patient reports she awakened this am with dry cough, hoarseness, and chest congestion.

## 2019-08-03 NOTE — ED Notes (Signed)
Lab at bedside

## 2019-08-03 NOTE — ED Provider Notes (Signed)
[facility] EMERGENCY DEPARTMENT Provider Note   CSN: [id number] Arrival date & time: 08/03/19  1038     History Chief Complaint  Patient presents with  . Abdominal Pain    Kendre Nissan is a 24 y.o. female.  Patient c/o left lower abd pain for the past couple days. Symptoms acute onset, moderate, cramping, dull, non radiating. States intercourse was uncomfortable and had to stop due to this left sided pain. Pt notes hx polycystic ovaries. Also notes urinary urgency. No dysuria or flank pain. No fever or chills. Also c/o non prod cough and body aches. No sore throat. No known covid exposure. No constipation, normal bm yesterday. No vomiting. No abd distension.   The history is provided by the patient.  Abdominal Pain Associated symptoms: cough   Associated symptoms: no chest pain, no chills, no constipation, no diarrhea, no fever, no shortness of breath, no sore throat, no vaginal bleeding, no vaginal discharge and no vomiting        Past Medical History:  Diagnosis Date  . Abdominal cramping 07/14/2014  . Amenorrhea 07/14/2014  . Chlamydia   . Contraceptive education 11/07/2013  . GERD (gastroesophageal reflux disease)   . Herpes genitalia   . PCO (polycystic ovaries) 08/02/2014  . Pelvic pain in female 07/14/2014  . Positive pregnancy test 08/02/2014    Patient Active Problem List   Diagnosis Date Noted  . 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 Previous cesarean section 03/06/2017  . PCO (polycystic ovaries) 08/02/2014  . Pelvic pain in female 07/14/2014  . Amenorrhea 07/14/2014    Past Surgical History:  Procedure Laterality Date  . CESAREAN SECTION    . CHOLECYSTECTOMY  10/01/2016  . DILATION AND CURETTAGE OF UTERUS  12/13/2011?  . INDUCED ABORTION  12/05/2017     OB History    Gravida  4   Para  2   Term  2   Preterm      AB  2   Living  2     SAB  1   TAB  1   Ectopic      Multiple  0   Live Births  2           Family  History  Problem Relation Age of Onset  . Asthma Brother   . Diabetes Maternal Grandmother   . Hypertension Maternal Grandmother   . Diabetes Maternal Grandfather   . Diabetes Paternal Grandmother   . Asthma Brother   . Asthma Brother   . Asthma Brother     Social History   Tobacco Use  . Smoking status: Never Smoker  . Smokeless tobacco: Never Used  Substance Use Topics  . Alcohol use: Yes  . Drug use: Yes    Types: Marijuana    Comment: yesterday    Home Medications Prior to Admission medications   Medication Sig Start Date End Date Taking? Authorizing Provider  dicyclomine (BENTYL) 20 MG tablet Take 1 tablet (20 mg total) by mouth 2 (two) times daily. 06/10/19   Joy, Shawn C, PA-C  ondansetron (ZOFRAN ODT) 4 MG disintegrating tablet Take 1 tablet (4 mg total) by mouth every 8 (eight) hours as needed for nausea or vomiting. 06/10/19   Joy, Hillard Danker, PA-C    Allergies    Patient has no known allergies.  Review of Systems   Review of Systems  Constitutional: Negative for chills and fever.  HENT: Negative for sore throat.   Eyes: Negative for redness.  Respiratory: Positive for cough. Negative for shortness of breath.   Cardiovascular: Negative for chest pain.  Gastrointestinal: Positive for abdominal pain. Negative for constipation, diarrhea and vomiting.  Genitourinary: Negative for flank pain, vaginal bleeding and vaginal discharge.  Musculoskeletal: Negative for back pain.  Skin: Negative for rash.  Neurological: Negative for headaches.  Hematological: Does not bruise/bleed easily.  Psychiatric/Behavioral: Negative for confusion.    Physical Exam Updated Vital Signs BP 123/68 (BP Location: Right Arm)   Pulse 94   Temp 97.7 F (36.5 C) (Oral)   Resp 18   Ht 1.727 m (5\' 8" )   Wt 98.9 kg   SpO2 100%   BMI 33.15 kg/m   Physical Exam Vitals and nursing note reviewed.  Constitutional:      Appearance: Normal appearance. She is well-developed.  HENT:      Head: Atraumatic.     Nose: Nose normal.     Mouth/Throat:     Mouth: Mucous membranes are moist.     Pharynx: Oropharynx is clear. No oropharyngeal exudate or posterior oropharyngeal erythema.  Eyes:     General: No scleral icterus.    Conjunctiva/sclera: Conjunctivae normal.  Neck:     Trachea: No tracheal deviation.  Cardiovascular:     Rate and Rhythm: Normal rate and regular rhythm.     Pulses: Normal pulses.     Heart sounds: Normal heart sounds. No murmur. No friction rub. No gallop.   Pulmonary:     Effort: Pulmonary effort is normal. No respiratory distress.     Breath sounds: Normal breath sounds.  Abdominal:     General: Bowel sounds are normal. There is no distension.     Palpations: Abdomen is soft. There is no mass.     Tenderness: There is no abdominal tenderness. There is no guarding or rebound.     Hernia: No hernia is present.  Genitourinary:    Comments: No cva tenderness. External gu exam normal. Mild whitish vaginal discharge. No vaginal trauma/lac. Cervix closed,  No cmt. No focal adnexal mass/tenderness.  Musculoskeletal:        General: No swelling.     Cervical back: Normal range of motion and neck supple. No rigidity. No muscular tenderness.  Skin:    General: Skin is warm and dry.     Findings: No rash.  Neurological:     Mental Status: She is alert.     Comments: Alert, speech normal.   Psychiatric:        Mood and Affect: Mood normal.     ED Results / Procedures / Treatments   Labs (all labs ordered are listed, but only abnormal results are displayed) Results for orders placed or performed during the hospital encounter of 08/03/19  Wet prep, genital   Specimen: Cervix; Genital  Result Value Ref Range   Yeast Wet Prep HPF POC NONE SEEN NONE SEEN   Trich, Wet Prep NONE SEEN NONE SEEN   Clue Cells Wet Prep HPF POC PRESENT (A) NONE SEEN   WBC, Wet Prep HPF POC RARE (A) NONE SEEN   Sperm NONE SEEN   CBC  Result Value Ref Range   WBC 8.0 4.0  - 10.5 K/uL   RBC 4.58 3.87 - 5.11 MIL/uL   Hemoglobin 12.4 12.0 - 15.0 g/dL   HCT 40.0 36.0 - 46.0 %   MCV 87.3 80.0 - 100.0 fL   MCH 27.1 26.0 - 34.0 pg   MCHC 31.0 30.0 - 36.0 g/dL   RDW  14.5 11.5 - 15.5 %   Platelets 250 150 - 400 K/uL   nRBC 0.0 0.0 - 0.2 %  Comprehensive metabolic panel  Result Value Ref Range   Sodium 136 135 - 145 mmol/L   Potassium 4.0 3.5 - 5.1 mmol/L   Chloride 104 98 - 111 mmol/L   CO2 25 22 - 32 mmol/L   Glucose, Bld 90 70 - 99 mg/dL   BUN 9 6 - 20 mg/dL   Creatinine, Ser 8.41 0.44 - 1.00 mg/dL   Calcium 8.8 (L) 8.9 - 10.3 mg/dL   Total Protein 7.1 6.5 - 8.1 g/dL   Albumin 4.1 3.5 - 5.0 g/dL   AST 13 (L) 15 - 41 U/L   ALT 14 0 - 44 U/L   Alkaline Phosphatase 63 38 - 126 U/L   Total Bilirubin 0.5 0.3 - 1.2 mg/dL   GFR calc non Af Amer >60 >60 mL/min   GFR calc Af Amer >60 >60 mL/min   Anion gap 7 5 - 15  Urinalysis, Routine w reflex microscopic  Result Value Ref Range   Color, Urine YELLOW YELLOW   APPearance CLEAR CLEAR   Specific Gravity, Urine 1.017 1.005 - 1.030   pH 6.0 5.0 - 8.0   Glucose, UA NEGATIVE NEGATIVE mg/dL   Hgb urine dipstick NEGATIVE NEGATIVE   Bilirubin Urine NEGATIVE NEGATIVE   Ketones, ur NEGATIVE NEGATIVE mg/dL   Protein, ur NEGATIVE NEGATIVE mg/dL   Nitrite NEGATIVE NEGATIVE   Leukocytes,Ua TRACE (A) NEGATIVE   RBC / HPF 0-5 0 - 5 RBC/hpf   WBC, UA 11-20 0 - 5 WBC/hpf   Bacteria, UA NONE SEEN NONE SEEN   Squamous Epithelial / LPF 0-5 0 - 5   Mucus PRESENT   Pregnancy, urine  Result Value Ref Range   Preg Test, Ur NEGATIVE NEGATIVE    EKG None  Radiology No results found.  Procedures Procedures (including critical care time)  Medications Ordered in ED Medications - No data to display  ED Course  I have reviewed the triage vital signs and the nursing notes.  Pertinent labs & imaging results that were available during my care of the patient were reviewed by me and considered in my medical decision  making (see chart for details).    MDM Rules/Calculators/A&P                      Labs ordered. Imaging ordered.   Reviewed nursing notes and prior charts for additional history.   Labs reviewed/interpreted by me - wet prep w clue cells. rx provided.  ua with possible uti, and pt with some urinary symptoms. Keflex po.   U/s reviewed/interpreted by me - no large ovarian cyst or torsion.  Pt afebrile, vitals normal, wbc normal. Recheck abd soft nt.   Pt currently appears stable for d/c.   Rec pcp f/u.  Return precautions provided.     Final Clinical Impression(s) / ED Diagnoses Final diagnoses:  None    Rx / DC Orders ED Discharge Orders    None       Cathren Laine, MD 08/03/19 1440

## 2019-08-04 LAB — GC/CHLAMYDIA PROBE AMP (~~LOC~~) NOT AT ARMC
Chlamydia: NEGATIVE
Neisseria Gonorrhea: NEGATIVE

## 2019-09-26 ENCOUNTER — Other Ambulatory Visit: Payer: Self-pay

## 2019-09-26 ENCOUNTER — Emergency Department (HOSPITAL_COMMUNITY)
Admission: EM | Admit: 2019-09-26 | Discharge: 2019-09-26 | Disposition: A | Discharge status: Home / Self Care | Payer: Medicaid Other | Attending: Emergency Medicine | Admitting: Emergency Medicine

## 2019-09-26 ENCOUNTER — Encounter (HOSPITAL_COMMUNITY): Payer: Self-pay

## 2019-09-26 DIAGNOSIS — R102 Pelvic and perineal pain: Secondary | ICD-10-CM | POA: Diagnosis present

## 2019-09-26 DIAGNOSIS — N72 Inflammatory disease of cervix uteri: Secondary | ICD-10-CM | POA: Diagnosis not present

## 2019-09-26 LAB — WET PREP, GENITAL
Clue Cells Wet Prep HPF POC: NONE SEEN
Sperm: NONE SEEN
Trich, Wet Prep: NONE SEEN
Yeast Wet Prep HPF POC: NONE SEEN

## 2019-09-26 LAB — URINALYSIS, ROUTINE W REFLEX MICROSCOPIC
Bilirubin Urine: NEGATIVE
Glucose, UA: NEGATIVE mg/dL
Hgb urine dipstick: NEGATIVE
Ketones, ur: NEGATIVE mg/dL
Leukocytes,Ua: NEGATIVE
Nitrite: NEGATIVE
Protein, ur: NEGATIVE mg/dL
Specific Gravity, Urine: 1.031 — ABNORMAL HIGH (ref 1.005–1.030)
pH: 6 (ref 5.0–8.0)

## 2019-09-26 LAB — CBC WITH DIFFERENTIAL/PLATELET
Abs Immature Granulocytes: 0.01 10*3/uL (ref 0.00–0.07)
Basophils Absolute: 0 10*3/uL (ref 0.0–0.1)
Basophils Relative: 0 %
Eosinophils Absolute: 0.1 10*3/uL (ref 0.0–0.5)
Eosinophils Relative: 2 %
HCT: 39.1 % (ref 36.0–46.0)
Hemoglobin: 11.9 g/dL — ABNORMAL LOW (ref 12.0–15.0)
Immature Granulocytes: 0 %
Lymphocytes Relative: 27 %
Lymphs Abs: 1.6 10*3/uL (ref 0.7–4.0)
MCH: 26.8 pg (ref 26.0–34.0)
MCHC: 30.4 g/dL (ref 30.0–36.0)
MCV: 88.1 fL (ref 80.0–100.0)
Monocytes Absolute: 0.4 10*3/uL (ref 0.1–1.0)
Monocytes Relative: 6 %
Neutro Abs: 3.7 10*3/uL (ref 1.7–7.7)
Neutrophils Relative %: 65 %
Platelets: 236 10*3/uL (ref 150–400)
RBC: 4.44 MIL/uL (ref 3.87–5.11)
RDW: 14.1 % (ref 11.5–15.5)
WBC: 5.8 10*3/uL (ref 4.0–10.5)
nRBC: 0 % (ref 0.0–0.2)

## 2019-09-26 LAB — COMPREHENSIVE METABOLIC PANEL
ALT: 14 U/L (ref 0–44)
AST: 16 U/L (ref 15–41)
Albumin: 3.9 g/dL (ref 3.5–5.0)
Alkaline Phosphatase: 69 U/L (ref 38–126)
Anion gap: 8 (ref 5–15)
BUN: 13 mg/dL (ref 6–20)
CO2: 25 mmol/L (ref 22–32)
Calcium: 8.9 mg/dL (ref 8.9–10.3)
Chloride: 104 mmol/L (ref 98–111)
Creatinine, Ser: 0.61 mg/dL (ref 0.44–1.00)
GFR calc Af Amer: >60 mL/min (ref >60)
GFR calc non Af Amer: >60 mL/min (ref >60)
Glucose, Bld: 93 mg/dL (ref 70–99)
Potassium: 4.1 mmol/L (ref 3.5–5.1)
Sodium: 137 mmol/L (ref 135–145)
Total Bilirubin: 0.2 mg/dL — ABNORMAL LOW (ref 0.3–1.2)
Total Protein: 7 g/dL (ref 6.5–8.1)

## 2019-09-26 LAB — PREGNANCY, URINE: Preg Test, Ur: NEGATIVE

## 2019-09-26 MED ORDER — LIDOCAINE HCL (PF) 1 % IJ SOLN
INTRAMUSCULAR | Status: AC
  Administered 2019-09-26: 1 mL
  Filled 2019-09-26: qty 2

## 2019-09-26 MED ORDER — DOXYCYCLINE HYCLATE 100 MG PO CAPS: 100.0000 mg | ORAL_CAPSULE | Freq: Two times a day (BID) | ORAL | 0 refills | Status: DC

## 2019-09-26 MED ORDER — CEFTRIAXONE SODIUM 500 MG IJ SOLR
500.0000 mg | Freq: Once | INTRAMUSCULAR | Status: AC
  Administered 2019-09-26: 500 mg via INTRAMUSCULAR
  Filled 2019-09-26: qty 500

## 2019-09-26 NOTE — Progress Notes (Signed)
CSW has reviewed patients chart and notes that patient is without primary care and/or insurance. CSW will follow up with patient regarding this matter and provide assistance as needed.   Courtney Zhang M. Naya Ilagan LCSWA Transitions of Care  Clinical Social Worker  Ph: [phone] 

## 2019-09-26 NOTE — Progress Notes (Signed)
CSW in contact with patient to inquire about her primary health care provider. Patient explained that she is without insurance and primary care. CSW educated patient on the [facility] program and offered to meet patient at bedside to assist with setting up an appointment. Patient was agreeable.  CSW to meet patient at bedside to assist with setting up services with care [facility]  Omran Keelin Tomma Rakers Transitions of Care  Clinical Social Worker  Ph: [phone]

## 2019-09-26 NOTE — Discharge Instructions (Addendum)
Take the antibiotic as directed until its finished.  You may review your results in MyChart in 3 to 4 days.  Follow-up with your primary doctor for recheck or return to the emergency department for any worsening symptoms.

## 2019-09-26 NOTE — Progress Notes (Signed)
CSW in contact with [facility] and provided them with patient contact information. [facility] informed CSW that they would be in contact to assist patient in getting set up with primary care  Courtney Zhang Rakers Transitions of Care  Clinical Social Worker  Ph: [phone]

## 2019-09-26 NOTE — ED Provider Notes (Signed)
[facility] EMERGENCY DEPARTMENT Provider Note   CSN: [id number] Arrival date & time: 09/26/19  5701     History Chief Complaint  Patient presents with  . Pelvic Pain    Courtney Zhang is a 24 y.o. female.  HPI      Courtney Zhang is a 24 y.o. female who presents to the Emergency Department complaining of pelvic cramping and clear vaginal discharge.  Symptoms began 2 to 3 days ago.  She describes the cramping as mild and similar to a menstrual cramp along her suprapubic area.  She reports unprotected intercourse with a new partner a week ago.  Today, she noticed a fullness and burning with urination.  She states that she has had UTIs in the past and her current symptoms feel somewhat similar although she does not recall having a vaginal discharge previously.  She denies fever or chills, nausea or vomiting, back pain, abnormal vaginal bleeding.  No genital rashes or lesions.    Past Medical History:  Diagnosis Date  . Abdominal cramping 07/14/2014  . Amenorrhea 07/14/2014  . Chlamydia   . Contraceptive education 11/07/2013  . GERD (gastroesophageal reflux disease)   . Herpes genitalia   . PCO (polycystic ovaries) 08/02/2014  . Pelvic pain in female 07/14/2014  . Positive pregnancy test 08/02/2014    Patient Active Problem List   Diagnosis Date Noted  . 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 Previous cesarean section 03/06/2017  . PCO (polycystic ovaries) 08/02/2014  . Pelvic pain in female 07/14/2014  . Amenorrhea 07/14/2014    Past Surgical History:  Procedure Laterality Date  . CESAREAN SECTION    . CHOLECYSTECTOMY  10/01/2016  . DILATION AND CURETTAGE OF UTERUS  12/13/2011?  . INDUCED ABORTION  12/05/2017     OB History    Gravida  4   Para  2   Term  2   Preterm      AB  2   Living  2     SAB  1   TAB  1   Ectopic      Multiple  0   Live Births  2           Family History  Problem Relation Age of Onset  . Asthma Brother   .  Diabetes Maternal Grandmother   . Hypertension Maternal Grandmother   . Diabetes Maternal Grandfather   . Diabetes Paternal Grandmother   . Asthma Brother   . Asthma Brother   . Asthma Brother     Social History   Tobacco Use  . Smoking status: Never Smoker  . Smokeless tobacco: Never Used  Substance Use Topics  . Alcohol use: Yes    Comment: occ  . Drug use: Yes    Types: Marijuana    Home Medications Prior to Admission medications   Medication Sig Start Date End Date Taking? Authorizing Provider  cephALEXin (KEFLEX) 500 MG capsule Take 2 capsules (1,000 mg total) by mouth 2 (two) times daily. 08/03/19   Cathren Laine, MD  dicyclomine (BENTYL) 20 MG tablet Take 1 tablet (20 mg total) by mouth 2 (two) times daily. Patient not taking: Reported on 08/03/2019 06/10/19   Harolyn Rutherford C, PA-C  metroNIDAZOLE (FLAGYL) 500 MG tablet Take 1 tablet (500 mg total) by mouth 2 (two) times daily. 08/03/19   Cathren Laine, MD  ondansetron (ZOFRAN ODT) 4 MG disintegrating tablet Take 1 tablet (4 mg total) by mouth every 8 (eight) hours as needed for  nausea or vomiting. Patient not taking: Reported on 08/03/2019 06/10/19   Lorayne Bender, PA-C    Allergies    Patient has no known allergies.  Review of Systems   Review of Systems  Constitutional: Negative for activity change, appetite change, chills and fever.  Respiratory: Negative for chest tightness and shortness of breath.   Cardiovascular: Negative for chest pain.  Gastrointestinal: Negative for abdominal pain, diarrhea, nausea and vomiting.  Genitourinary: Positive for dysuria, pelvic pain and vaginal discharge. Negative for decreased urine volume, difficulty urinating, flank pain, frequency, hematuria, urgency and vaginal bleeding.  Musculoskeletal: Negative for back pain.  Skin: Negative for rash.  Neurological: Negative for dizziness, weakness and numbness.  Hematological: Negative for adenopathy.  Psychiatric/Behavioral: Negative for  confusion.    Physical Exam Updated Vital Signs BP 110/76 (BP Location: Right Arm)   Pulse 73   Temp 98.7 F (37.1 C) (Oral)   Resp 18   Ht 5\' 8"  (1.727 m)   Wt 99.8 kg   LMP 09/14/2019   SpO2 100%   Breastfeeding No   BMI 33.45 kg/m   Physical Exam Vitals and nursing note reviewed. Exam conducted with a chaperone present.  Constitutional:      General: She is not in acute distress.    Appearance: Normal appearance. She is not ill-appearing.  HENT:     Head: Atraumatic.     Mouth/Throat:     Mouth: Mucous membranes are moist.  Cardiovascular:     Rate and Rhythm: Normal rate and regular rhythm.  Pulmonary:     Effort: Pulmonary effort is normal.  Chest:     Chest wall: No tenderness.  Abdominal:     General: There is no distension.     Palpations: Abdomen is soft.     Tenderness: There is no abdominal tenderness. There is no right CVA tenderness, left CVA tenderness or guarding.  Genitourinary:    Vagina: Vaginal discharge present.     Cervix: Cervical motion tenderness present. No discharge, friability, lesion or cervical bleeding.     Uterus: Normal. Not enlarged.      Adnexa:        Right: No mass or tenderness.         Left: No mass or tenderness.       Comments: Thin, milky white vaginal discharge present.  Mild to moderate cervical motion tenderness also present.  No palpable adnexal masses or tenderness. Musculoskeletal:        General: Normal range of motion.     Right lower leg: No edema.     Left lower leg: No edema.  Skin:    General: Skin is warm.     Capillary Refill: Capillary refill takes less than 2 seconds.     Findings: No rash.  Neurological:     General: No focal deficit present.     Mental Status: She is alert.     Sensory: No sensory deficit.     Motor: No weakness.     ED Results / Procedures / Treatments   Labs (all labs ordered are listed, but only abnormal results are displayed) Labs Reviewed  WET PREP, GENITAL - Abnormal;  Notable for the following components:      Result Value   WBC, Wet Prep HPF POC RARE (*)    All other components within normal limits  URINALYSIS, ROUTINE W REFLEX MICROSCOPIC - Abnormal; Notable for the following components:   Specific Gravity, Urine 1.031 (*)    All  other components within normal limits  COMPREHENSIVE METABOLIC PANEL - Abnormal; Notable for the following components:   Total Bilirubin 0.2 (*)    All other components within normal limits  CBC WITH DIFFERENTIAL/PLATELET - Abnormal; Notable for the following components:   Hemoglobin 11.9 (*)    All other components within normal limits  PREGNANCY, URINE  RPR  GC/CHLAMYDIA PROBE AMP ([facility]) NOT AT [facility]    EKG None  Radiology No results found.  Procedures Procedures (including critical care time)  Medications Ordered in ED Medications - No data to display  ED Course  I have reviewed the triage vital signs and the nursing notes.  Pertinent labs & imaging results that were available during my care of the patient were reviewed by me and considered in my medical decision making (see chart for details).    MDM Rules/Calculators/A&P                      Pt here with crampy pelvic discomfort and vaginal d/c with dysuria sx's.  She appears well and non-toxic, abd is soft and w/o peritoneal signs.  On pelvic exam, she does have some mild CMT and discharge.  No adnexal tenderness or masses.  Clinically, I do not suspect TOA or ovarian torsion and pregnancy test was negative.  I do feel this may be related to a possible STI or a mild, early PID.  GC and chlamydia cultures are pending and I will treat with rocephin and rx for doxy.  She appears appropriate for d/c and return precautions discussed.    Final Clinical Impression(s) / ED Diagnoses Final diagnoses:  Cervicitis    Rx / DC Orders ED Discharge Orders    None       Rosey Bath 09/27/19 0803    Raeford Razor, MD 09/28/19 [phone]

## 2019-09-26 NOTE — ED Triage Notes (Signed)
Pt reports pelvic cramping and vaginal discharge x 3 days.  Reports lmp was April 7.    Pt says has a little burning and "fullness" when she voids.

## 2019-09-27 LAB — GC/CHLAMYDIA PROBE AMP (~~LOC~~) NOT AT ARMC
Chlamydia: NEGATIVE
Comment: NEGATIVE
Comment: NORMAL
Neisseria Gonorrhea: NEGATIVE

## 2019-09-27 LAB — RPR: RPR Ser Ql: NONREACTIVE

## 2019-09-28 ENCOUNTER — Emergency Department (HOSPITAL_COMMUNITY)
Admission: EM | Admit: 2019-09-28 | Discharge: 2019-09-28 | Disposition: A | Discharge status: Home / Self Care | Payer: Medicaid Other | Attending: Emergency Medicine | Admitting: Emergency Medicine

## 2019-09-28 ENCOUNTER — Other Ambulatory Visit: Payer: Self-pay

## 2019-09-28 ENCOUNTER — Encounter (HOSPITAL_COMMUNITY): Payer: Self-pay | Admitting: *Deleted

## 2019-09-28 DIAGNOSIS — R112 Nausea with vomiting, unspecified: Secondary | ICD-10-CM | POA: Diagnosis present

## 2019-09-28 LAB — CBC WITH DIFFERENTIAL/PLATELET
Abs Immature Granulocytes: 0.04 10*3/uL (ref 0.00–0.07)
Basophils Absolute: 0 10*3/uL (ref 0.0–0.1)
Basophils Relative: 0 %
Eosinophils Absolute: 0 10*3/uL (ref 0.0–0.5)
Eosinophils Relative: 0 %
HCT: 43.3 % (ref 36.0–46.0)
Hemoglobin: 13.7 g/dL (ref 12.0–15.0)
Immature Granulocytes: 0 %
Lymphocytes Relative: 14 %
Lymphs Abs: 1.5 10*3/uL (ref 0.7–4.0)
MCH: 27 pg (ref 26.0–34.0)
MCHC: 31.6 g/dL (ref 30.0–36.0)
MCV: 85.2 fL (ref 80.0–100.0)
Monocytes Absolute: 0.5 10*3/uL (ref 0.1–1.0)
Monocytes Relative: 5 %
Neutro Abs: 8.4 10*3/uL — ABNORMAL HIGH (ref 1.7–7.7)
Neutrophils Relative %: 81 %
Platelets: 299 10*3/uL (ref 150–400)
RBC: 5.08 MIL/uL (ref 3.87–5.11)
RDW: 13.8 % (ref 11.5–15.5)
WBC: 10.4 10*3/uL (ref 4.0–10.5)
nRBC: 0 % (ref 0.0–0.2)

## 2019-09-28 LAB — URINALYSIS, ROUTINE W REFLEX MICROSCOPIC
Bacteria, UA: NONE SEEN
Bilirubin Urine: NEGATIVE
Glucose, UA: NEGATIVE mg/dL
Hgb urine dipstick: NEGATIVE
Ketones, ur: 20 mg/dL — AB
Leukocytes,Ua: NEGATIVE
Nitrite: NEGATIVE
Protein, ur: 30 mg/dL — AB
Specific Gravity, Urine: 1.033 — ABNORMAL HIGH (ref 1.005–1.030)
pH: 6 (ref 5.0–8.0)

## 2019-09-28 LAB — COMPREHENSIVE METABOLIC PANEL
ALT: 19 U/L (ref 0–44)
AST: 17 U/L (ref 15–41)
Albumin: 4.8 g/dL (ref 3.5–5.0)
Alkaline Phosphatase: 82 U/L (ref 38–126)
Anion gap: 12 (ref 5–15)
BUN: 13 mg/dL (ref 6–20)
CO2: 29 mmol/L (ref 22–32)
Calcium: 9.9 mg/dL (ref 8.9–10.3)
Chloride: 99 mmol/L (ref 98–111)
Creatinine, Ser: 0.73 mg/dL (ref 0.44–1.00)
GFR calc Af Amer: >60 mL/min (ref >60)
GFR calc non Af Amer: >60 mL/min (ref >60)
Glucose, Bld: 111 mg/dL — ABNORMAL HIGH (ref 70–99)
Potassium: 3.5 mmol/L (ref 3.5–5.1)
Sodium: 140 mmol/L (ref 135–145)
Total Bilirubin: 1 mg/dL (ref 0.3–1.2)
Total Protein: 8.7 g/dL — ABNORMAL HIGH (ref 6.5–8.1)

## 2019-09-28 LAB — TROPONIN I (HIGH SENSITIVITY): Troponin I (High Sensitivity): 3 ng/L (ref <18)

## 2019-09-28 LAB — LIPASE, BLOOD: Lipase: 18 U/L (ref 11–51)

## 2019-09-28 MED ORDER — ONDANSETRON HCL 4 MG/2ML IJ SOLN
4.0000 mg | Freq: Once | INTRAMUSCULAR | Status: AC
  Administered 2019-09-28: 4 mg via INTRAVENOUS
  Filled 2019-09-28: qty 2

## 2019-09-28 MED ORDER — SODIUM CHLORIDE 0.9 % IV BOLUS
1000.0000 mL | Freq: Once | INTRAVENOUS | Status: AC
  Administered 2019-09-28: 1000 mL via INTRAVENOUS

## 2019-09-28 MED ORDER — PROMETHAZINE HCL 25 MG/ML IJ SOLN
12.5000 mg | Freq: Once | INTRAMUSCULAR | Status: AC
  Administered 2019-09-28: 12.5 mg via INTRAVENOUS
  Filled 2019-09-28: qty 1

## 2019-09-28 MED ORDER — PROMETHAZINE HCL 25 MG PO TABS: 25.0000 mg | ORAL_TABLET | Freq: Four times a day (QID) | ORAL | 0 refills | Status: DC | PRN

## 2019-09-28 NOTE — ED Provider Notes (Signed)
Courtney Zhang   CSN: [id number] Arrival date & time: 09/28/19  0844     History Chief Complaint  Patient presents with  . Emesis    Courtney Zhang is a 24 y.o. female.  HPI      Courtney Zhang is a 24 y.o. female who presents to the Emergency Department complaining of nausea, vomiting since late Monday.  She was seen here by me on Monday for possible STD.  She was given IM Rocephin here and prescription for doxycycline.  She developed nausea and vomiting several hours after discharge.  She reports history of nausea when given Rocephin in the past, but never to the point of vomiting.  She reports multiple episodes of vomiting during the night and continued nausea.  She also complains of heaviness to her mid upper chest that is intermittent and present just before vomiting.  She has been unable to tolerate liquids.  She states her symptoms began before starting the doxycycline.  She denies diarrhea, abdominal pain, fever or chills, back pain and shortness of breath.   Past Medical History:  Diagnosis Date  . Abdominal cramping 07/14/2014  . Amenorrhea 07/14/2014  . Chlamydia   . Contraceptive education 11/07/2013  . GERD (gastroesophageal reflux disease)   . Herpes genitalia   . PCO (polycystic ovaries) 08/02/2014  . Pelvic pain in female 07/14/2014  . Positive pregnancy test 08/02/2014    Patient Active Problem List   Diagnosis Date Noted  . 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 Previous cesarean section 03/06/2017  . PCO (polycystic ovaries) 08/02/2014  . Pelvic pain in female 07/14/2014  . Amenorrhea 07/14/2014    Past Surgical History:  Procedure Laterality Date  . CESAREAN SECTION    . CHOLECYSTECTOMY  10/01/2016  . DILATION AND CURETTAGE OF UTERUS  12/13/2011?  . INDUCED ABORTION  12/05/2017     OB History    Gravida  4   Para  2   Term  2   Preterm      AB  2   Living  2     SAB  1   TAB  1   Ectopic      Multiple  0   Live Births  2           Family History  Problem Relation Age of Onset  . Asthma Brother   . Diabetes Maternal Grandmother   . Hypertension Maternal Grandmother   . Diabetes Maternal Grandfather   . Diabetes Paternal Grandmother   . Asthma Brother   . Asthma Brother   . Asthma Brother     Social History   Tobacco Use  . Smoking status: Never Smoker  . Smokeless tobacco: Never Used  Substance Use Topics  . Alcohol use: Yes    Comment: occ  . Drug use: Yes    Types: Marijuana    Home Medications Prior to Admission medications   Medication Sig Start Date End Date Taking? Authorizing Provider  doxycycline (VIBRAMYCIN) 100 MG capsule Take 1 capsule (100 mg total) by mouth 2 (two) times daily. 09/26/19  Yes Bernadene Garside, PA-C  dicyclomine (BENTYL) 20 MG tablet Take 1 tablet (20 mg total) by mouth 2 (two) times daily. Patient not taking: Reported on 08/03/2019 06/10/19   Arlean Hopping C, PA-C  metroNIDAZOLE (FLAGYL) 500 MG tablet Take 1 tablet (500 mg total) by mouth 2 (two) times daily. Patient not taking: Reported on 09/28/2019 08/03/19   Lajean Saver,  MD  ondansetron (ZOFRAN ODT) 4 MG disintegrating tablet Take 1 tablet (4 mg total) by mouth every 8 (eight) hours as needed for nausea or vomiting. Patient not taking: Reported on 08/03/2019 06/10/19   Anselm Pancoast, PA-C    Allergies    Patient has no known allergies.  Review of Systems   Review of Systems  Constitutional: Negative for appetite change, chills and fever.  Respiratory: Negative for cough and shortness of breath.   Cardiovascular: Positive for chest pain.  Gastrointestinal: Positive for nausea and vomiting. Negative for abdominal pain, blood in stool and diarrhea.  Genitourinary: Negative for difficulty urinating, dysuria and flank pain.  Musculoskeletal: Negative for myalgias.  Skin: Negative for color change and rash.  Neurological: Negative for dizziness, weakness, numbness and headaches.    Hematological: Negative for adenopathy.    Physical Exam Updated Vital Signs BP (!) 131/99   Pulse 75   Temp 98.2 F (36.8 C) (Oral)   Resp 18   Ht 5\' 8"  (1.727 m)   Wt 99.8 kg   LMP 09/14/2019   SpO2 100%   BMI 33.45 kg/m   Physical Exam Vitals reviewed.  Constitutional:      Appearance: Normal appearance. She is not toxic-appearing.  HENT:     Mouth/Throat:     Mouth: Mucous membranes are dry.     Pharynx: No oropharyngeal exudate or posterior oropharyngeal erythema.  Cardiovascular:     Rate and Rhythm: Normal rate and regular rhythm.     Pulses: Normal pulses.  Pulmonary:     Effort: Pulmonary effort is normal.     Breath sounds: Normal breath sounds.  Chest:     Chest wall: No tenderness.  Abdominal:     General: There is no distension.     Palpations: Abdomen is soft. There is no mass.     Tenderness: There is no abdominal tenderness. There is no right CVA tenderness or left CVA tenderness.  Musculoskeletal:        General: Normal range of motion.  Skin:    General: Skin is warm.     Capillary Refill: Capillary refill takes less than 2 seconds.     Findings: No rash.  Neurological:     General: No focal deficit present.     Mental Status: She is alert.     Sensory: No sensory deficit.     Motor: No weakness.     ED Results / Procedures / Treatments   Labs (all labs ordered are listed, but only abnormal results are displayed) Labs Reviewed  COMPREHENSIVE METABOLIC PANEL - Abnormal; Notable for the following components:      Result Value   Glucose, Bld 111 (*)    Total Protein 8.7 (*)    All other components within normal limits  CBC WITH DIFFERENTIAL/PLATELET - Abnormal; Notable for the following components:   Neutro Abs 8.4 (*)    All other components within normal limits  URINALYSIS, ROUTINE W REFLEX MICROSCOPIC - Abnormal; Notable for the following components:   Color, Urine AMBER (*)    APPearance HAZY (*)    Specific Gravity, Urine 1.033  (*)    Ketones, ur 20 (*)    Protein, ur 30 (*)    All other components within normal limits  LIPASE, BLOOD  TROPONIN I (HIGH SENSITIVITY)    EKG EKG Interpretation  Date/Time:  Wednesday September 28 2019 10:55:39 EDT Ventricular Rate:  68 PR Interval:    QRS Duration: 90 QT Interval:  409 QTC Calculation: 435 R Axis:   12 Text Interpretation: Sinus rhythm LVH by voltage Borderline T abnormalities, anterior leads Confirmed by Vanetta Mulders [phone]) on 09/28/2019 10:59:38 AM   Radiology No results found.  Procedures Procedures (including critical care time)  Medications Ordered in ED Medications  sodium chloride 0.9 % bolus 1,000 mL (0 mLs Intravenous Stopped 09/28/19 1040)  ondansetron (ZOFRAN) injection 4 mg (4 mg Intravenous Given 09/28/19 0934)  promethazine (PHENERGAN) injection 12.5 mg (12.5 mg Intravenous Given 09/28/19 1122)    ED Course  I have reviewed the triage vital signs and the nursing notes.  Pertinent labs & imaging results that were available during my care of the patient were reviewed by me and considered in my medical decision making (see chart for details).    MDM Rules/Calculators/A&P                       Pt see here on Monday by me, treated with IM Rocephin here and rx for doxycyline given.  She returns today reporting nausea and vomiting since receiving the Rocephin.  She reports history of nausea in the past after receiving Rocephin but never having vomiting.  She was actively vomiting on arrival.  Abdomen is soft and nontender on exam without peritoneal signs.  I feel that her symptoms are most likely related to a viral process and doubtful this is secondary to Rocephin.  No rash or edema.  I will check labs, order antiemetic and administer IV saline  Patient given IV ondansetron, continues to have vomiting.  Will order IV promethazine.  1140 on recheck, patient sleeping, easily aroused.  No further vomiting.  Labs are reassuring.  EKG and troponin  are reassuring and patient no longer complaining of chest pain.  I do not feel this is a cardiac process.  Will try oral fluid challenge  1350 patient has tolerated oral fluid challenge.  She reports feeling much better and is ready for discharge home.  I feel that this is appropriate.  I will provide prescription for promethazine, she agrees to small frequent sips of clear fluids today and bland diet as tolerated.  Return precautions were discussed.  Final Clinical Impression(s) / ED Diagnoses Final diagnoses:  Non-intractable vomiting with nausea, unspecified vomiting type    Rx / DC Orders ED Discharge Orders    None       Pauline Aus, PA-C 09/28/19 1404    Vanetta Mulders, MD 10/03/19 [phone]

## 2019-09-28 NOTE — Discharge Instructions (Addendum)
Small, frequent sips of clear fluids today, then bland diet as tolerated.  Follow-up with your primary doctor for recheck or return to the emergency department if you develop any worsening symptoms such as fever or abdominal pain.  Discontinue the doxycycline

## 2019-09-28 NOTE — ED Triage Notes (Signed)
Pt c/o vomiting and generalized weakness since she was here 2 days ago and received an IM injection for possible STD. Pt reports she was given PO antibiotics as well but didn't start taking them until yesterday and the vomiting started the day before. Denies diarrhea, fever, pain.

## 2019-10-28 ENCOUNTER — Ambulatory Visit (INDEPENDENT_AMBULATORY_CARE_PROVIDER_SITE_OTHER): Payer: Self-pay | Admitting: Adult Health

## 2019-10-28 ENCOUNTER — Encounter: Payer: Self-pay | Admitting: Adult Health

## 2019-10-28 VITALS — BP 118/80 | HR 78 | Ht 68.0 in | Wt 219.5 lb

## 2019-10-28 DIAGNOSIS — N760 Acute vaginitis: Secondary | ICD-10-CM | POA: Insufficient documentation

## 2019-10-28 DIAGNOSIS — B9689 Other specified bacterial agents as the cause of diseases classified elsewhere: Secondary | ICD-10-CM

## 2019-10-28 DIAGNOSIS — N898 Other specified noninflammatory disorders of vagina: Secondary | ICD-10-CM | POA: Insufficient documentation

## 2019-10-28 LAB — POCT WET PREP (WET MOUNT)
Clue Cells Wet Prep Whiff POC: POSITIVE
WBC, Wet Prep HPF POC: POSITIVE

## 2019-10-28 MED ORDER — FLUCONAZOLE 150 MG PO TABS: ORAL_TABLET | ORAL | 1 refills | Status: DC

## 2019-10-28 MED ORDER — METRONIDAZOLE 500 MG PO TABS: 500.0000 mg | ORAL_TABLET | Freq: Two times a day (BID) | ORAL | 0 refills | Status: DC

## 2019-10-28 NOTE — Progress Notes (Signed)
  Subjective:     Patient ID: Courtney Zhang, female   DOB: 12/22/1995, 24 y.o.   MRN: [id number]  HPI Courtney Zhang is a 24 year old black femalelack female, single, [id number] in complaining of vaginal odor and discharge, thinks it is BV, she is self pay, but trying to get medicaid, so can get pap and physical.   Review of Systems +vaginal discharge and odor x 1 week  She says seems to get BV or yeast after period No new sex partner Reviewed past medical,surgical, social and family history. Reviewed medications and allergies.     Objective:   Physical Exam BP 118/80 (BP Location: Left Arm, Patient Position: Sitting, Cuff Size: Normal)   Pulse 78   Ht 5\' 8"  (1.727 m)   Wt 219 lb 8 oz (99.6 kg)   LMP 10/15/2019 (Exact Date)   BMI 33.37 kg/m  Skin warm and dry.Pelvic: external genitalia is normal in appearance no lesions, vagina: white discharge with odor,urethra has no lesions or masses noted, cervix:smooth and bulbous, uterus: normal size, shape and contour, non tender, no masses felt, adnexa: no masses or tenderness noted. Bladder is non tender and no masses felt. Wet prep: + for clue cells and +WBCs. AA 0 Fall risk is low PHQ 9 score is 0 Examination chaperoned by 12/15/2019 LPN    Assessment:     1. Vaginal discharge  2. Vaginal odor   3. BV (bacterial vaginosis) Rx flagyl 500 mg 1 bid x 7 days, no alcohol, review handout on BV She requests rx for diflucan says flagyl gives her yeast     Meds ordered this encounter  Medications  . metroNIDAZOLE (FLAGYL) 500 MG tablet    Sig: Take 1 tablet (500 mg total) by mouth 2 (two) times daily.    Dispense:  14 tablet    Refill:  0    Order Specific Question:   Supervising Provider    Answer:   Faith Rogue, LUTHER H [[id number]]  . fluconazole (DIFLUCAN) 150 MG tablet    Sig: Take 1 now and 1 in 3 days if needed    Dispense:  2 tablet    Refill:  1    Order Specific Question:   Supervising Provider    Answer:   Despina Hidden [[id number]]      Plan:      Return in 6 weeks for pap and physical

## 2019-10-28 NOTE — Patient Instructions (Signed)
Bacterial Vaginosis  Bacterial vaginosis is a vaginal infection that occurs when the normal balance of bacteria in the vagina is disrupted. It results from an overgrowth of certain bacteria. This is the most common vaginal infection among women ages 15-44. Because bacterial vaginosis increases your risk for STIs (sexually transmitted infections), getting treated can help reduce your risk for chlamydia, gonorrhea, herpes, and HIV (human immunodeficiency virus). Treatment is also important for preventing complications in pregnant women, because this condition can cause an early (premature) delivery. What are the causes? This condition is caused by an increase in harmful bacteria that are normally present in small amounts in the vagina. However, the reason that the condition develops is not fully understood. What increases the risk? The following factors may make you more likely to develop this condition:  Having a new sexual partner or multiple sexual partners.  Having unprotected sex.  Douching.  Having an intrauterine device (IUD).  Smoking.  Drug and alcohol abuse.  Taking certain antibiotic medicines.  Being pregnant. You cannot get bacterial vaginosis from toilet seats, bedding, swimming pools, or contact with objects around you. What are the signs or symptoms? Symptoms of this condition include:  Grey or white vaginal discharge. The discharge can also be watery or foamy.  A fish-like odor with discharge, especially after sexual intercourse or during menstruation.  Itching in and around the vagina.  Burning or pain with urination. Some women with bacterial vaginosis have no signs or symptoms. How is this diagnosed? This condition is diagnosed based on:  Your medical history.  A physical exam of the vagina.  Testing a sample of vaginal fluid under a microscope to look for a large amount of bad bacteria or abnormal cells. Your health care provider may use a cotton swab or  a small wooden spatula to collect the sample. How is this treated? This condition is treated with antibiotics. These may be given as a pill, a vaginal cream, or a medicine that is put into the vagina (suppository). If the condition comes back after treatment, a second round of antibiotics may be needed. Follow these instructions at home: Medicines  Take over-the-counter and prescription medicines only as told by your health care provider.  Take or use your antibiotic as told by your health care provider. Do not stop taking or using the antibiotic even if you start to feel better. General instructions  If you have a female sexual partner, tell her that you have a vaginal infection. She should see her health care provider and be treated if she has symptoms. If you have a female sexual partner, he does not need treatment.  During treatment: ? Avoid sexual activity until you finish treatment. ? Do not douche. ? Avoid alcohol as directed by your health care provider. ? Avoid breastfeeding as directed by your health care provider.  Drink enough water and fluids to keep your urine clear or pale yellow.  Keep the area around your vagina and rectum clean. ? Wash the area daily with warm water. ? Wipe yourself from front to back after using the toilet.  Keep all follow-up visits as told by your health care provider. This is important. How is this prevented?  Do not douche.  Wash the outside of your vagina with warm water only.  Use protection when having sex. This includes latex condoms and dental dams.  Limit how many sexual partners you have. To help prevent bacterial vaginosis, it is best to have sex with just one partner (  monogamous).  Make sure you and your sexual partner are tested for STIs.  Wear cotton or cotton-lined underwear.  Avoid wearing tight pants and pantyhose, especially during summer.  Limit the amount of alcohol that you drink.  Do not use any products that contain  nicotine or tobacco, such as cigarettes and e-cigarettes. If you need help quitting, ask your health care provider.  Do not use illegal drugs. Where to find more information  Centers for Disease Control and Prevention: www.cdc.gov/std  American Sexual Health Association (ASHA): www.ashastd.org  [place] Department of Health and Human Services, Office on Women's Health: www.womenshealth.gov/ or https://www.womenshealth.gov/a-z-topics/bacterial-vaginosis Contact a health care provider if:  Your symptoms do not improve, even after treatment.  You have more discharge or pain when urinating.  You have a fever.  You have pain in your abdomen.  You have pain during sex.  You have vaginal bleeding between periods. Summary  Bacterial vaginosis is a vaginal infection that occurs when the normal balance of bacteria in the vagina is disrupted.  Because bacterial vaginosis increases your risk for STIs (sexually transmitted infections), getting treated can help reduce your risk for chlamydia, gonorrhea, herpes, and HIV (human immunodeficiency virus). Treatment is also important for preventing complications in pregnant women, because the condition can cause an early (premature) delivery.  This condition is treated with antibiotic medicines. These may be given as a pill, a vaginal cream, or a medicine that is put into the vagina (suppository). This information is not intended to replace advice given to you by your health care provider. Make sure you discuss any questions you have with your health care provider. Document Revised: 05/08/2017 Document Reviewed: 02/09/2016 Elsevier Patient Education  2020 [employer]  

## 2019-12-07 ENCOUNTER — Other Ambulatory Visit: Payer: Self-pay | Admitting: Adult Health

## 2020-01-13 ENCOUNTER — Other Ambulatory Visit: Payer: Self-pay | Admitting: Adult Health

## 2020-01-13 ENCOUNTER — Telehealth: Payer: Self-pay | Admitting: Adult Health

## 2020-01-13 MED ORDER — FLUCONAZOLE 150 MG PO TABS: ORAL_TABLET | ORAL | 1 refills | Status: DC

## 2020-01-13 NOTE — Telephone Encounter (Signed)
patient called stating that she has a yeast infection and she wanted to schedule an appointment with Victorino Dike, but Mrs. Richarda Osmond next available appointment id on the 19 th of august pt states that she can not wait that long. Pt would like for Victorino Dike to call her in a refill of her Flagyl to the [facility] on [address]. Pt states that she knows it is a yeast infection she suffers from them a lot. Please contact pt when sent

## 2020-01-13 NOTE — Telephone Encounter (Signed)
Telephoned patient at home number. Patient states has thick, clumpy, white discharge itching. Patient states no appointment available until August 19 and doesn't want to wait that long. Advised patient would send message to Cyril Mourning, NP and if does not hear back can check with pharmacy. Patient voiced understanding.

## 2020-01-13 NOTE — Addendum Note (Signed)
Addended by: Cyril Mourning A on: 01/13/2020 01:33 PM   Modules accepted: Orders

## 2020-01-13 NOTE — Telephone Encounter (Signed)
Will rx diflucan  

## 2020-02-16 ENCOUNTER — Other Ambulatory Visit: Payer: Self-pay

## 2020-02-16 ENCOUNTER — Emergency Department (HOSPITAL_COMMUNITY): Payer: Medicaid Other

## 2020-02-16 ENCOUNTER — Encounter (HOSPITAL_COMMUNITY): Payer: Self-pay

## 2020-02-16 ENCOUNTER — Emergency Department (HOSPITAL_COMMUNITY)
Admission: EM | Admit: 2020-02-16 | Discharge: 2020-02-16 | Disposition: A | Discharge status: Home / Self Care | Payer: Medicaid Other | Attending: Emergency Medicine | Admitting: Emergency Medicine

## 2020-02-16 DIAGNOSIS — W228XXA Striking against or struck by other objects, initial encounter: Secondary | ICD-10-CM | POA: Diagnosis not present

## 2020-02-16 DIAGNOSIS — Y939 Activity, unspecified: Secondary | ICD-10-CM | POA: Insufficient documentation

## 2020-02-16 DIAGNOSIS — Y999 Unspecified external cause status: Secondary | ICD-10-CM | POA: Diagnosis not present

## 2020-02-16 DIAGNOSIS — S92531A Displaced fracture of distal phalanx of right lesser toe(s), initial encounter for closed fracture: Secondary | ICD-10-CM | POA: Diagnosis not present

## 2020-02-16 DIAGNOSIS — Y929 Unspecified place or not applicable: Secondary | ICD-10-CM | POA: Insufficient documentation

## 2020-02-16 DIAGNOSIS — S90931A Unspecified superficial injury of right great toe, initial encounter: Secondary | ICD-10-CM | POA: Diagnosis present

## 2020-02-16 NOTE — ED Provider Notes (Signed)
[facility] EMERGENCY DEPARTMENT Provider Note   CSN: [id number] Arrival date & time: 02/16/20  0846     History Chief Complaint  Patient presents with  . Toe Pain    Courtney Zhang is a 24 y.o. female who presents to the ED today with complaint of sudden onset, constant, gradually improving now worsening, R 5th toe pain x 2 weeks. Pt reports that 2 weeks ago she accidentally stubbed it against her washing machine. She reports she had immediate pain to the area and over the next couple of days her toe became swollen and purple. This seemed to resolve however 2 days ago she began having worsening pain and repeat swelling to the area; no color change this time. She denies reinjury of the toe. She attempted to come to the ED earlier in her course however the lobby seemed so full so she left. She has been applying ice to the area and she soaked it in epsom salts earlier on however has not been taking anything for pain. No wounds. No other complaints at this time.   The history is provided by the patient and medical records.       Past Medical History:  Diagnosis Date  . Abdominal cramping 07/14/2014  . Amenorrhea 07/14/2014  . Chlamydia   . Contraceptive education 11/07/2013  . GERD (gastroesophageal reflux disease)   . Herpes genitalia   . PCO (polycystic ovaries) 08/02/2014  . Pelvic pain in female 07/14/2014  . Positive pregnancy test 08/02/2014    Patient Active Problem List   Diagnosis Date Noted  . BV (bacterial vaginosis) 10/28/2019  . Vaginal odor 10/28/2019  . Vaginal discharge 10/28/2019  . 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 Previous cesarean section 03/06/2017  . PCO (polycystic ovaries) 08/02/2014  . Pelvic pain in female 07/14/2014  . Amenorrhea 07/14/2014    Past Surgical History:  Procedure Laterality Date  . CESAREAN SECTION    . CHOLECYSTECTOMY  10/01/2016  . DILATION AND CURETTAGE OF UTERUS  12/13/2011?  . INDUCED ABORTION  12/05/2017     OB  History    Gravida  4   Para  2   Term  2   Preterm      AB  2   Living  2     SAB  1   TAB  1   Ectopic      Multiple  0   Live Births  2           Family History  Problem Relation Age of Onset  . Asthma Brother   . Diabetes Maternal Grandmother   . Hypertension Maternal Grandmother   . Diabetes Maternal Grandfather   . Diabetes Paternal Grandmother   . Asthma Brother   . Asthma Brother   . Asthma Brother     Social History   Tobacco Use  . Smoking status: Never Smoker  . Smokeless tobacco: Never Used  Vaping Use  . Vaping Use: Some days  Substance Use Topics  . Alcohol use: Not Currently    Comment: occ  . Drug use: Yes    Types: Marijuana    Home Medications Prior to Admission medications   Medication Sig Start Date End Date Taking? Authorizing Provider  fluconazole (DIFLUCAN) 150 MG tablet TAKE 1 TABLET BY MOUTH NOW AND 1 TABLET IN 3 DAYS IF NEEDED 01/13/20   Cyril Mourning A, NP  metroNIDAZOLE (FLAGYL) 500 MG tablet TAKE 1 TABLET(500 MG) BY MOUTH TWICE DAILY 01/13/20  Adline Potter, NP    Allergies    Patient has no known allergies.  Review of Systems   Review of Systems  Constitutional: Negative for chills and fever.  Musculoskeletal: Positive for arthralgias and joint swelling.  Skin: Negative for color change and wound.    Physical Exam Updated Vital Signs BP 111/74 (BP Location: Right Arm)   Pulse 75   Temp 98.4 F (36.9 C) (Oral)   Resp 18   Ht 5\' 8"  (1.727 m)   Wt 90.7 kg   LMP 01/18/2020   SpO2 100%   BMI 30.41 kg/m   Physical Exam Vitals and nursing note reviewed.  Constitutional:      Appearance: She is not ill-appearing.  HENT:     Head: Normocephalic and atraumatic.  Eyes:     Conjunctiva/sclera: Conjunctivae normal.  Cardiovascular:     Rate and Rhythm: Normal rate and regular rhythm.  Pulmonary:     Effort: Pulmonary effort is normal.     Breath sounds: Normal breath sounds. No wheezing, rhonchi  or rales.  Musculoskeletal:     Comments: Mild swelling noted to right 5th toe with TTP to the distal aspect medially. ROM intact to toe. No tenderness to remainder of toes or metatarsals. Cap refill < 2 seconds. 2+ DP pulse.   Skin:    General: Skin is warm and dry.     Coloration: Skin is not jaundiced.  Neurological:     Mental Status: She is alert.     ED Results / Procedures / Treatments   Labs (all labs ordered are listed, but only abnormal results are displayed) Labs Reviewed - No data to display  EKG None  Radiology DG Toe 5th Right  Result Date: 02/16/2020 CLINICAL DATA:  Blunt trauma to the fifth toe several days ago, initial encounter EXAM: RIGHT FIFTH TOE COMPARISON:  None. FINDINGS: Minimally displaced fracture of the fifth distal phalanx is noted. No other fractures are seen. Mild soft tissue swelling is noted. IMPRESSION: Minimally displaced distal fifth phalangeal fracture at the base. Mild soft tissue swelling is noted. Electronically Signed   By: 04/17/2020 M.D.   On: 02/16/2020 09:57    Procedures Procedures (including critical care time)  Medications Ordered in ED Medications - No data to display  ED Course  I have reviewed the triage vital signs and the nursing notes.  Pertinent labs & imaging results that were available during my care of the patient were reviewed by me and considered in my medical decision making (see chart for details).    MDM Rules/Calculators/A&P                          24 year old female who presents to the ED today complaining of right fifth toe pain secondary to hitting it against her washing machine 2 weeks ago, has been having worsening pain for the past 2 days.  Has not taken anything for pain.  Has not been evaluated for this in the past.  On arrival to the ED vitals are stable.  Patient appears to be in no acute distress.  An x-ray of her fifth toe was taken while in the waiting room with a small minimally displaced distal  fifth phalangeal fracture at the base.  This is consistent with where patient is having her pain.  She is currently wearing slide like sandals made of rubber.  We will plan to provide postop shoe to provide hard to base  for patient as this will help with pain.  I have instructed on rice therapy while at home as well as ibuprofen and Tylenol as needed for pain.  She is requesting a school note as she missed school earlier today to come to the ED for evaluation.  Will oblige.  Patient instructed to follow-up with her PCP.  Will provide Ortho follow-up as well.  She is in agreement with plan is stable for discharge home.  This note was prepared using Dragon voice recognition software and may include unintentional dictation errors due to the inherent limitations of voice recognition software.  Final Clinical Impression(s) / ED Diagnoses Final diagnoses:  Closed displaced fracture of distal phalanx of lesser toe of right foot, initial encounter    Rx / DC Orders ED Discharge Orders    None       Discharge Instructions     The xray showed a small fracture at the base of your 5th toe (pinky). Please wear post op shoe as this will allow for reduction in pain. Any shoes that have stretch/bend to them can aggravate pain while walking.   While at home please rest, ice, and elevate your toe. You can take 600 mg Ibuprofen every 8 hours and 1,000 mg Tylenol every 8 hours as needed for the pain (recommend to alternate medication so you are taking something every 4 hours around the clock).   Please follow up with Dr. Aundria Rud with orthopedics  Return to the ED for any worsening symptoms        Tanda Rockers, PA-C 02/16/20 1104    Blane Ohara, MD 02/20/20 1610

## 2020-02-16 NOTE — ED Triage Notes (Signed)
Pt says hit her r little toe on washing machine on aug 23.  Reports swelling and pain.  Reports bruising has improved but still swollen.

## 2020-02-16 NOTE — Discharge Instructions (Addendum)
The xray showed a small fracture at the base of your 5th toe (pinky). Please wear post op shoe as this will allow for reduction in pain. Any shoes that have stretch/bend to them can aggravate pain while walking.   While at home please rest, ice, and elevate your toe. You can take 600 mg Ibuprofen every 8 hours and 1,000 mg Tylenol every 8 hours as needed for the pain (recommend to alternate medication so you are taking something every 4 hours around the clock).   Please follow up with Dr. Aundria Rud with orthopedics  Return to the ED for any worsening symptoms

## 2020-02-21 ENCOUNTER — Telehealth: Payer: Self-pay | Admitting: Adult Health

## 2020-02-21 MED ORDER — METRONIDAZOLE 500 MG PO TABS: ORAL_TABLET | ORAL | 0 refills | Status: DC

## 2020-02-21 MED ORDER — FLUCONAZOLE 150 MG PO TABS: ORAL_TABLET | ORAL | 1 refills | Status: DC

## 2020-02-21 NOTE — Telephone Encounter (Signed)
Patient wants refill on flagyl and difllucan sent to [facility] [place] [address]

## 2020-02-21 NOTE — Telephone Encounter (Signed)
Called could not be completed at this time, I did refill flagyl and diflcuan

## 2020-02-21 NOTE — Addendum Note (Signed)
Addended by: Cyril Mourning A on: 02/21/2020 12:54 PM   Modules accepted: Orders

## 2020-03-26 ENCOUNTER — Emergency Department (HOSPITAL_COMMUNITY): Payer: Medicaid Other

## 2020-03-26 ENCOUNTER — Encounter (HOSPITAL_COMMUNITY): Payer: Self-pay | Admitting: Emergency Medicine

## 2020-03-26 ENCOUNTER — Emergency Department (HOSPITAL_COMMUNITY)
Admission: EM | Admit: 2020-03-26 | Discharge: 2020-03-26 | Disposition: A | Discharge status: Home / Self Care | Payer: Medicaid Other | Attending: Emergency Medicine | Admitting: Emergency Medicine

## 2020-03-26 ENCOUNTER — Other Ambulatory Visit: Payer: Self-pay

## 2020-03-26 DIAGNOSIS — K219 Gastro-esophageal reflux disease without esophagitis: Secondary | ICD-10-CM | POA: Diagnosis not present

## 2020-03-26 DIAGNOSIS — R102 Pelvic and perineal pain: Secondary | ICD-10-CM

## 2020-03-26 DIAGNOSIS — R1084 Generalized abdominal pain: Secondary | ICD-10-CM | POA: Diagnosis not present

## 2020-03-26 DIAGNOSIS — R112 Nausea with vomiting, unspecified: Secondary | ICD-10-CM | POA: Insufficient documentation

## 2020-03-26 DIAGNOSIS — R110 Nausea: Secondary | ICD-10-CM

## 2020-03-26 LAB — CBC WITH DIFFERENTIAL/PLATELET
Abs Immature Granulocytes: 0.03 10*3/uL (ref 0.00–0.07)
Basophils Absolute: 0 10*3/uL (ref 0.0–0.1)
Basophils Relative: 0 %
Eosinophils Absolute: 0 10*3/uL (ref 0.0–0.5)
Eosinophils Relative: 0 %
HCT: 38.9 % (ref 36.0–46.0)
Hemoglobin: 12.6 g/dL (ref 12.0–15.0)
Immature Granulocytes: 0 %
Lymphocytes Relative: 8 %
Lymphs Abs: 0.8 10*3/uL (ref 0.7–4.0)
MCH: 27.5 pg (ref 26.0–34.0)
MCHC: 32.4 g/dL (ref 30.0–36.0)
MCV: 84.7 fL (ref 80.0–100.0)
Monocytes Absolute: 0.2 10*3/uL (ref 0.1–1.0)
Monocytes Relative: 2 %
Neutro Abs: 9.4 10*3/uL — ABNORMAL HIGH (ref 1.7–7.7)
Neutrophils Relative %: 90 %
Platelets: 245 10*3/uL (ref 150–400)
RBC: 4.59 MIL/uL (ref 3.87–5.11)
RDW: 14.7 % (ref 11.5–15.5)
WBC: 10.4 10*3/uL (ref 4.0–10.5)
nRBC: 0 % (ref 0.0–0.2)

## 2020-03-26 LAB — COMPREHENSIVE METABOLIC PANEL
ALT: 15 U/L (ref 0–44)
AST: 16 U/L (ref 15–41)
Albumin: 4.5 g/dL (ref 3.5–5.0)
Alkaline Phosphatase: 62 U/L (ref 38–126)
Anion gap: 9 (ref 5–15)
BUN: 9 mg/dL (ref 6–20)
CO2: 24 mmol/L (ref 22–32)
Calcium: 9.1 mg/dL (ref 8.9–10.3)
Chloride: 105 mmol/L (ref 98–111)
Creatinine, Ser: 0.59 mg/dL (ref 0.44–1.00)
GFR, Estimated: >60 mL/min (ref >60)
Glucose, Bld: 126 mg/dL — ABNORMAL HIGH (ref 70–99)
Potassium: 3.4 mmol/L — ABNORMAL LOW (ref 3.5–5.1)
Sodium: 138 mmol/L (ref 135–145)
Total Bilirubin: 0.8 mg/dL (ref 0.3–1.2)
Total Protein: 7.9 g/dL (ref 6.5–8.1)

## 2020-03-26 LAB — URINALYSIS, ROUTINE W REFLEX MICROSCOPIC
Bilirubin Urine: NEGATIVE
Glucose, UA: NEGATIVE mg/dL
Hgb urine dipstick: NEGATIVE
Ketones, ur: 80 mg/dL — AB
Leukocytes,Ua: NEGATIVE
Nitrite: NEGATIVE
Protein, ur: NEGATIVE mg/dL
Specific Gravity, Urine: >1.046 — ABNORMAL HIGH (ref 1.005–1.030)
pH: 6 (ref 5.0–8.0)

## 2020-03-26 LAB — HCG, SERUM, QUALITATIVE: Preg, Serum: NEGATIVE

## 2020-03-26 LAB — LIPASE, BLOOD: Lipase: 21 U/L (ref 11–51)

## 2020-03-26 LAB — PREGNANCY, URINE: Preg Test, Ur: NEGATIVE

## 2020-03-26 MED ORDER — PROCHLORPERAZINE EDISYLATE 10 MG/2ML IJ SOLN
10.0000 mg | Freq: Once | INTRAMUSCULAR | Status: AC
  Administered 2020-03-26: 10 mg via INTRAVENOUS
  Filled 2020-03-26: qty 2

## 2020-03-26 MED ORDER — IOHEXOL 300 MG/ML  SOLN
100.0000 mL | Freq: Once | INTRAMUSCULAR | Status: AC | PRN
  Administered 2020-03-26: 100 mL via INTRAVENOUS

## 2020-03-26 MED ORDER — NAPROXEN 375 MG PO TABS: 375.0000 mg | ORAL_TABLET | Freq: Two times a day (BID) | ORAL | 0 refills | Status: DC

## 2020-03-26 MED ORDER — SODIUM CHLORIDE 0.9 % IV BOLUS
1000.0000 mL | Freq: Once | INTRAVENOUS | Status: AC
  Administered 2020-03-26: 1000 mL via INTRAVENOUS

## 2020-03-26 MED ORDER — MORPHINE SULFATE (PF) 2 MG/ML IV SOLN
2.0000 mg | Freq: Once | INTRAVENOUS | Status: AC
  Administered 2020-03-26: 2 mg via INTRAVENOUS
  Filled 2020-03-26: qty 1

## 2020-03-26 MED ORDER — ONDANSETRON HCL 4 MG PO TABS: 4.0000 mg | ORAL_TABLET | Freq: Three times a day (TID) | ORAL | 0 refills | Status: DC | PRN

## 2020-03-26 NOTE — ED Triage Notes (Signed)
Pt c/o mid upper abd pain since last night with weakness and n/v; pt reports she thinks she is pregnant-home test was negative, last period started 02/18/2020

## 2020-03-26 NOTE — ED Provider Notes (Addendum)
[facility] EMERGENCY DEPARTMENT Provider Note   CSN: [id number] Arrival date & time: 03/26/20  1304     History Chief Complaint  Patient presents with  . Abdominal Pain    Courtney Zhang is a 24 y.o. female.  Courtney Zhang is a 24 year old woman who presents to the ED with 2-3 days of abdominal pain and nausea.  She is a previous medical history significant for cholecystectomy, C-section.  She reports that she is having increasing abdominal discomfort and nausea for the past 2-3 days.  The pain is primarily midline in her lower abdomen and has become unbearable.  She reports that it feels like someone is cutting her stomach with a scalpel from the inside.  Yesterday, she reported more than 8 episodes of NB/NB emesis.  Her abdominal pain seemed to worsen into the morning and she ultimately presented to the emergency room from further work-up and assessment.  She is currently sexually active and her last menstrual period was about 1.5 months ago.  She reports that she feels bloated is that she is pregnant and did a home pregnancy test yesterday which was negative.  She specifically denies vaginal bleeding and vaginal discharge.  She has had 1 new partner in the past month.  She has a surgical history notable for a previous cholecystectomy several years ago and previous C-section with one of her pregnancies.  She has also had a dilation and curettage.          Past Medical History:  Diagnosis Date  . Abdominal cramping 07/14/2014  . Amenorrhea 07/14/2014  . Chlamydia   . Contraceptive education 11/07/2013  . GERD (gastroesophageal reflux disease)   . Herpes genitalia   . PCO (polycystic ovaries) 08/02/2014  . Pelvic pain in female 07/14/2014  . Positive pregnancy test 08/02/2014    Patient Active Problem List   Diagnosis Date Noted  . BV (bacterial vaginosis) 10/28/2019  . Vaginal odor 10/28/2019  . Vaginal discharge 10/28/2019  . 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  Previous cesarean section 03/06/2017  . PCO (polycystic ovaries) 08/02/2014  . Pelvic pain in female 07/14/2014  . Amenorrhea 07/14/2014    Past Surgical History:  Procedure Laterality Date  . CESAREAN SECTION    . CHOLECYSTECTOMY  10/01/2016  . DILATION AND CURETTAGE OF UTERUS  12/13/2011?  . INDUCED ABORTION  12/05/2017     OB History    Gravida  4   Para  2   Term  2   Preterm      AB  2   Living  2     SAB  1   TAB  1   Ectopic      Multiple  0   Live Births  2           Family History  Problem Relation Age of Onset  . Asthma Brother   . Diabetes Maternal Grandmother   . Hypertension Maternal Grandmother   . Diabetes Maternal Grandfather   . Diabetes Paternal Grandmother   . Asthma Brother   . Asthma Brother   . Asthma Brother     Social History   Tobacco Use  . Smoking status: Never Smoker  . Smokeless tobacco: Never Used  Vaping Use  . Vaping Use: Some days  Substance Use Topics  . Alcohol use: Not Currently    Comment: occ  . Drug use: Yes    Types: Marijuana    Home Medications Prior to Admission medications  Medication Sig Start Date End Date Taking? Authorizing Provider  fluconazole (DIFLUCAN) 150 MG tablet TAKE 1 TABLET BY MOUTH NOW AND 1 TABLET IN 3 DAYS IF NEEDED 02/21/20   Adline Potter, NP  metroNIDAZOLE (FLAGYL) 500 MG tablet TAKE 1 TABLET(500 MG) BY MOUTH TWICE DAILY 02/21/20   Adline Potter, NP    Allergies    Patient has no known allergies.  Review of Systems   Review of Systems  Constitutional: Negative for chills, fever and unexpected weight change.  HENT: Negative for congestion.   Respiratory: Positive for cough. Negative for chest tightness, shortness of breath and wheezing.   Cardiovascular: Negative for chest pain and palpitations.  Gastrointestinal: Positive for abdominal pain, nausea and vomiting. Negative for abdominal distention, blood in stool, constipation and diarrhea.  Genitourinary:  Negative for dysuria, flank pain, vaginal bleeding and vaginal discharge.  Skin: Negative for rash.  Neurological: Negative for weakness and numbness.  Psychiatric/Behavioral: Negative for behavioral problems.    Physical Exam Updated Vital Signs BP (!) 147/89 (BP Location: Right Arm)   Pulse 73   Temp 97.6 F (36.4 C) (Oral)   Resp 20   Ht 5\' 8"  (1.727 m)   Wt 97.5 kg   LMP 02/18/2020   SpO2 100%   BMI 32.69 kg/m   Physical Exam Constitutional:      General: She is in acute distress.     Appearance: She is well-developed. She is ill-appearing. She is not toxic-appearing.  HENT:     Head: Normocephalic and atraumatic.     Mouth/Throat:     Mouth: Mucous membranes are moist.     Pharynx: Oropharynx is clear. No pharyngeal swelling or oropharyngeal exudate.  Eyes:     Extraocular Movements: Extraocular movements intact.     Pupils: Pupils are equal, round, and reactive to light.  Cardiovascular:     Rate and Rhythm: Normal rate and regular rhythm.     Heart sounds: Normal heart sounds.  Pulmonary:     Effort: Pulmonary effort is normal. No respiratory distress.     Breath sounds: Normal breath sounds. No wheezing or rales.  Abdominal:     General: Abdomen is flat. Bowel sounds are decreased.     Tenderness: There is generalized abdominal tenderness and tenderness in the right lower quadrant and left lower quadrant. There is guarding (over all quadrants worst over lower quadrants). There is no right CVA tenderness or left CVA tenderness.     Hernia: No hernia is present.  Skin:    General: Skin is warm and dry.     Capillary Refill: Capillary refill takes less than 2 seconds.  Neurological:     General: No focal deficit present.     Mental Status: She is alert. She is disoriented.  Psychiatric:        Mood and Affect: Mood normal.     ED Results / Procedures / Treatments   Labs (all labs ordered are listed, but only abnormal results are displayed) Labs Reviewed    HCG, SERUM, QUALITATIVE  COMPREHENSIVE METABOLIC PANEL  CBC WITH DIFFERENTIAL/PLATELET  URINALYSIS, ROUTINE W REFLEX MICROSCOPIC    EKG None  Radiology No results found.  Procedures Procedures (including critical care time)  Medications Ordered in ED Medications  morphine 2 MG/ML injection 2 mg (has no administration in time range)  prochlorperazine (COMPAZINE) injection 10 mg (has no administration in time range)    ED Course  I have reviewed the triage vital signs and the  nursing notes.  Pertinent labs & imaging results that were available during my care of the patient were reviewed by me and considered in my medical decision making (see chart for details).    MDM Rules/Calculators/A&P                          Courtney Zhang is a 24 year old woman who presents to the ED with 2-3 days of abdominal pain and nausea.  She is a previous medical history significant for cholecystectomy, C-section.  Her history and presentation is concerning for acute abdomen.  She has had a previous cholecystectomy but we will do blood work to assess pathology from gallbladder remnants or other reasons for cholestasis.  Presentation is mildly suspicious for an appendicitis or diverticulitis based on the lower quadrant tenderness.  Last menstrual period being over 1 month ago is also concerning for an ectopic pregnancy although she did have a negative urine pregnancy test yesterday at home.  We will get urine pregnancy here in the ED to confirm that.  PID is also possible this does seem to be an abrupt onset of PID and there is no evidence of vaginal discharge at this time.  For now, we will start her work-up with basic labs and CT abdomen as long as pregnancy test is negative.  A pelvic exam was performed for completion.  She is not having any significant vaginal discharge but samples were taken for GC/chlamydia testing.  She does have a significant cervical motion tenderness although her right adnexa was  particularly tender with palpation.  Final Clinical Impression(s) / ED Diagnoses Final diagnoses:  None    Rx / DC Orders ED Discharge Orders    None       Mirian Mo, MD 03/26/20 1433    Mirian Mo, MD 03/26/20 1537    Blane Ohara, MD 03/26/20 1559

## 2020-03-26 NOTE — ED Notes (Signed)
ED Provider at bedside. 

## 2020-03-26 NOTE — Clinical Social Work Note (Signed)
Transition of Care [facility]) - Emergency Department Mini Assessment   Patient Details  Name: Courtney Zhang MRN: [id number] Date of Birth: [birth year]  [facility] CM/SW Contact:    Villa Herb, LCSWA Phone Number: [phone] 03/26/2020, 4:39 PM  Clinical Narrative: CSW visited pt in the ED due to pt having no PCP. CSW provided pt with list of local PCPs accepting new pts. TOC signing off.   ED Mini Assessment: What brought you to the Emergency Department? : Abdominal pain  Barriers to Discharge: ED PCP establishment  Barrier interventions: PCP resource list provided  Means of departure: Car  Interventions which prevented an admission or readmission: Other (must enter comment) (PCP resource list provided)  Patient Contact and Communications   Choice offered to / list presented to : NA  Admission diagnosis:  emesis weakness Patient Active Problem List   Diagnosis Date Noted  . BV (bacterial vaginosis) 10/28/2019  . Vaginal odor 10/28/2019  . Vaginal discharge 10/28/2019  . 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 Previous cesarean section 03/06/2017  . PCO (polycystic ovaries) 08/02/2014  . Pelvic pain in female 07/14/2014  . Amenorrhea 07/14/2014   PCP:  Patient, No Pcp Per Pharmacy:   [id number] - [place], [place] - [address] [place] [address] Phone: [phone] Fax: [phone]

## 2020-03-26 NOTE — ED Notes (Addendum)
Entered room and introduced self to patient at this time. Pt appears to be resting in bed with no obvious signs of distress. Respirations are even and unlabored with equal chest rise and fall. Bed is locked in the lowest position, side rails x1 at patient request. Educated on the current plan of care, all questions and concerns voiced addressed at this time. Educated on hourly rounding and call light use, verbalized understanding.

## 2020-03-26 NOTE — ED Provider Notes (Signed)
Courtney Zhang is taken in sign out from Dr.Frank at shift change. The patient is here with abd pain and vomiting.  Labs are without sig abnormalityslightly elevated blood glucose with mild hypokalemia of insignificant value. hcg negative.  I reviewed CT images which appear negative. Awaiting [place] results.  Ultrasound results are negative for any abnormality.  I am waiting on a urinalysis.   Patient no longer wishes to wait to give a urinalysis.  She states "I think this is viral and I want to go home."  I am in agreement with the patient.  I reviewed her labs and imaging and think this is very likely viral gastroenteritis.  It does not seem to be an emergent cause of her symptoms today.  She is no longer nauseated.  She did have about 100 mL of vomitus earlier but that was after coughing.  I think she is appropriate for discharge at this time will discharge with naproxen and Zofran.  Return for any new or worsening symptoms.   Arthor Captain, PA-C 03/26/20 1925    Pricilla Loveless, MD 03/27/20 [phone]

## 2020-03-26 NOTE — Discharge Instructions (Addendum)

## 2020-03-27 LAB — GC/CHLAMYDIA PROBE AMP (~~LOC~~) NOT AT ARMC
Chlamydia: NEGATIVE
Comment: NEGATIVE
Comment: NORMAL
Neisseria Gonorrhea: NEGATIVE

## 2020-03-28 ENCOUNTER — Other Ambulatory Visit: Payer: Self-pay

## 2020-03-28 ENCOUNTER — Emergency Department (HOSPITAL_COMMUNITY)
Admission: EM | Admit: 2020-03-28 | Discharge: 2020-03-28 | Disposition: A | Discharge status: Home / Self Care | Payer: Medicaid Other | Attending: Emergency Medicine | Admitting: Emergency Medicine

## 2020-03-28 ENCOUNTER — Other Ambulatory Visit: Payer: Medicaid Other | Admitting: Adult Health

## 2020-03-28 ENCOUNTER — Encounter (HOSPITAL_COMMUNITY): Payer: Self-pay | Admitting: Emergency Medicine

## 2020-03-28 DIAGNOSIS — K529 Noninfective gastroenteritis and colitis, unspecified: Secondary | ICD-10-CM | POA: Diagnosis not present

## 2020-03-28 DIAGNOSIS — Z20822 Contact with and (suspected) exposure to covid-19: Secondary | ICD-10-CM | POA: Insufficient documentation

## 2020-03-28 DIAGNOSIS — K920 Hematemesis: Secondary | ICD-10-CM | POA: Diagnosis present

## 2020-03-28 DIAGNOSIS — E876 Hypokalemia: Secondary | ICD-10-CM | POA: Diagnosis not present

## 2020-03-28 DIAGNOSIS — A084 Viral intestinal infection, unspecified: Secondary | ICD-10-CM

## 2020-03-28 LAB — BASIC METABOLIC PANEL
Anion gap: 13 (ref 5–15)
BUN: 11 mg/dL (ref 6–20)
CO2: 27 mmol/L (ref 22–32)
Calcium: 9.9 mg/dL (ref 8.9–10.3)
Chloride: 97 mmol/L — ABNORMAL LOW (ref 98–111)
Creatinine, Ser: 0.6 mg/dL (ref 0.44–1.00)
GFR, Estimated: >60 mL/min (ref >60)
Glucose, Bld: 102 mg/dL — ABNORMAL HIGH (ref 70–99)
Potassium: 3.2 mmol/L — ABNORMAL LOW (ref 3.5–5.1)
Sodium: 137 mmol/L (ref 135–145)

## 2020-03-28 LAB — CBC
HCT: 41 % (ref 36.0–46.0)
Hemoglobin: 13.4 g/dL (ref 12.0–15.0)
MCH: 27.3 pg (ref 26.0–34.0)
MCHC: 32.7 g/dL (ref 30.0–36.0)
MCV: 83.5 fL (ref 80.0–100.0)
Platelets: 286 10*3/uL (ref 150–400)
RBC: 4.91 MIL/uL (ref 3.87–5.11)
RDW: 14.6 % (ref 11.5–15.5)
WBC: 9.6 10*3/uL (ref 4.0–10.5)
nRBC: 0 % (ref 0.0–0.2)

## 2020-03-28 LAB — HEPATIC FUNCTION PANEL
ALT: 19 U/L (ref 0–44)
AST: 16 U/L (ref 15–41)
Albumin: 4.8 g/dL (ref 3.5–5.0)
Alkaline Phosphatase: 64 U/L (ref 38–126)
Bilirubin, Direct: 0.2 mg/dL (ref 0.0–0.2)
Indirect Bilirubin: 0.8 mg/dL (ref 0.3–0.9)
Total Bilirubin: 1 mg/dL (ref 0.3–1.2)
Total Protein: 8.5 g/dL — ABNORMAL HIGH (ref 6.5–8.1)

## 2020-03-28 LAB — URINALYSIS, ROUTINE W REFLEX MICROSCOPIC
Glucose, UA: NEGATIVE mg/dL
Hgb urine dipstick: NEGATIVE
Ketones, ur: 20 mg/dL — AB
Leukocytes,Ua: NEGATIVE
Nitrite: NEGATIVE
Protein, ur: 100 mg/dL — AB
Specific Gravity, Urine: 1.035 — ABNORMAL HIGH (ref 1.005–1.030)
pH: 5 (ref 5.0–8.0)

## 2020-03-28 LAB — SAMPLE TO BLOOD BANK

## 2020-03-28 LAB — RESPIRATORY PANEL BY RT PCR (FLU A&B, COVID)
Influenza A by PCR: NEGATIVE
Influenza B by PCR: NEGATIVE
SARS Coronavirus 2 by RT PCR: NEGATIVE

## 2020-03-28 LAB — LIPASE, BLOOD: Lipase: 23 U/L (ref 11–51)

## 2020-03-28 LAB — HCG, QUANTITATIVE, PREGNANCY: hCG, Beta Chain, Quant, S: 1 m[IU]/mL (ref <5)

## 2020-03-28 MED ORDER — SODIUM CHLORIDE 0.9 % IV BOLUS
1000.0000 mL | Freq: Once | INTRAVENOUS | Status: AC
  Administered 2020-03-28: 1000 mL via INTRAVENOUS

## 2020-03-28 MED ORDER — DICYCLOMINE HCL 10 MG PO CAPS
10.0000 mg | ORAL_CAPSULE | Freq: Once | ORAL | Status: AC
  Administered 2020-03-28: 10 mg via ORAL
  Filled 2020-03-28: qty 1

## 2020-03-28 MED ORDER — POTASSIUM CHLORIDE CRYS ER 20 MEQ PO TBCR
20.0000 meq | EXTENDED_RELEASE_TABLET | Freq: Once | ORAL | Status: AC
  Administered 2020-03-28: 20 meq via ORAL
  Filled 2020-03-28: qty 1

## 2020-03-28 MED ORDER — ONDANSETRON HCL 4 MG/2ML IJ SOLN
4.0000 mg | Freq: Once | INTRAMUSCULAR | Status: AC
  Administered 2020-03-28: 4 mg via INTRAVENOUS
  Filled 2020-03-28: qty 2

## 2020-03-28 MED ORDER — DICYCLOMINE HCL 20 MG PO TABS: 20.0000 mg | ORAL_TABLET | Freq: Two times a day (BID) | ORAL | 0 refills | Status: DC

## 2020-03-28 MED ORDER — ALUM & MAG HYDROXIDE-SIMETH 200-200-20 MG/5ML PO SUSP
30.0000 mL | Freq: Once | ORAL | Status: AC
  Administered 2020-03-28: 30 mL via ORAL
  Filled 2020-03-28: qty 30

## 2020-03-28 MED ORDER — SODIUM CHLORIDE 0.9 % IV BOLUS
500.0000 mL | Freq: Once | INTRAVENOUS | Status: AC
  Administered 2020-03-28: 500 mL via INTRAVENOUS

## 2020-03-28 MED ORDER — METOCLOPRAMIDE HCL 5 MG/ML IJ SOLN
10.0000 mg | Freq: Once | INTRAMUSCULAR | Status: AC
  Administered 2020-03-28: 10 mg via INTRAVENOUS
  Filled 2020-03-28: qty 2

## 2020-03-28 MED ORDER — POTASSIUM CHLORIDE 10 MEQ/100ML IV SOLN: 10.0000 meq | Freq: Once | INTRAVENOUS | Status: DC

## 2020-03-28 MED ORDER — POTASSIUM CHLORIDE ER 10 MEQ PO TBCR: 10.0000 meq | EXTENDED_RELEASE_TABLET | Freq: Every day | ORAL | 0 refills | Status: DC

## 2020-03-28 MED ORDER — LIDOCAINE VISCOUS HCL 2 % MT SOLN
15.0000 mL | Freq: Once | OROMUCOSAL | Status: AC
  Administered 2020-03-28: 15 mL via ORAL
  Filled 2020-03-28: qty 15

## 2020-03-28 MED ORDER — ONDANSETRON 4 MG PO TBDP: 4.0000 mg | ORAL_TABLET | Freq: Three times a day (TID) | ORAL | 0 refills | Status: DC | PRN

## 2020-03-28 NOTE — Discharge Instructions (Addendum)
Please pick up medication and take as needed. I have prescribed nausea medication that dissolved on your tongue - it is recommended to take this 30 minutes before trying to eat or drink anything.  I have also prescribed medication that helps with abdominal pain/cramping.  Your potassium was mildly decreased today - please take potassium supplements daily for the next 5 days as indicated. You will need to have your potassium level rechecked in 1 weeks time.   Follow up with your PCP regarding your ED visit  Return to the ED IMMEDIATELY for any worsening symptoms

## 2020-03-28 NOTE — ED Triage Notes (Signed)
See here on Oct 18 th (2 days ago).  Pt not improving, medication not helping prescribed.  Pt reports menstrual cycle is 10 days late.  Been taking pregnancy test at home but has been negative.  Pt c/o severe abdominal pain since Saturday.

## 2020-03-28 NOTE — ED Notes (Signed)
Patient unable to provide urine sample at this time

## 2020-03-28 NOTE — ED Notes (Signed)
Patient continues to state she cannot provide [place] with urine sample.

## 2020-03-28 NOTE — ED Provider Notes (Signed)
[facility] EMERGENCY DEPARTMENT Provider Note   CSN: [id number] Arrival date & time: 03/28/20  1318     History Chief Complaint  Patient presents with  . Hematemesis    Courtney Zhang is a 24 y.o. female with PMHx GERD who presents to the ED today with CC hematemesis.   Pt reports that she began having diffuse upper abdominal pain, nausea, and vomiting 5 days ago. She reports she was seen in the ED 2 days ago for same and prescribed zofran and naproxen. Pt reports she noticed streaks of blood in her emesis yesterday and believes it was from the naproxen. She states she took it as prescribed and did not over medicate. Pt has not noticed any blood today. Pt states she continues to have emesis anytime she tries to eat or drink something. She has not had a BM since her symptoms began. She is presenting here today for continuation of symptoms. Denies worsening abdominal pain. She denies any fevers or chills. No recent sick contacts. Pt is unvaccinated against covid 19.   Per chart review: Pt was seen in the ED on 10/18 for same. ED workup including CBC, CMP, Lipase, U/A, urine preg, CT A/P, pelvic exam, and pelvic ultrasound without acute findings.   Pt does mention she had not had a menstrual cycle in over 1 month however has had multiple negative pregnancy tests both at home and in the ED.   The history is provided by the patient and medical records.       Past Medical History:  Diagnosis Date  . Abdominal cramping 07/14/2014  . Amenorrhea 07/14/2014  . Chlamydia   . Contraceptive education 11/07/2013  . GERD (gastroesophageal reflux disease)   . Herpes genitalia   . PCO (polycystic ovaries) 08/02/2014  . Pelvic pain in female 07/14/2014  . Positive pregnancy test 08/02/2014    Patient Active Problem List   Diagnosis Date Noted  . BV (bacterial vaginosis) 10/28/2019  . Vaginal odor 10/28/2019  . Vaginal discharge 10/28/2019  . 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  Previous cesarean section 03/06/2017  . PCO (polycystic ovaries) 08/02/2014  . Pelvic pain in female 07/14/2014  . Amenorrhea 07/14/2014    Past Surgical History:  Procedure Laterality Date  . CESAREAN SECTION    . CHOLECYSTECTOMY  10/01/2016  . DILATION AND CURETTAGE OF UTERUS  12/13/2011?  . INDUCED ABORTION  12/05/2017     OB History    Gravida  4   Para  2   Term  2   Preterm      AB  2   Living  2     SAB  1   TAB  1   Ectopic      Multiple  0   Live Births  2           Family History  Problem Relation Age of Onset  . Asthma Brother   . Diabetes Maternal Grandmother   . Hypertension Maternal Grandmother   . Diabetes Maternal Grandfather   . Diabetes Paternal Grandmother   . Asthma Brother   . Asthma Brother   . Asthma Brother     Social History   Tobacco Use  . Smoking status: Never Smoker  . Smokeless tobacco: Never Used  Vaping Use  . Vaping Use: Some days  Substance Use Topics  . Alcohol use: Not Currently    Comment: occ  . Drug use: Yes    Types: Marijuana  Home Medications Prior to Admission medications   Medication Sig Start Date End Date Taking? Authorizing Provider  fluconazole (DIFLUCAN) 150 MG tablet TAKE 1 TABLET BY MOUTH NOW AND 1 TABLET IN 3 DAYS IF NEEDED 02/21/20  Yes Cyril Mourning A, NP  metroNIDAZOLE (FLAGYL) 500 MG tablet TAKE 1 TABLET(500 MG) BY MOUTH TWICE DAILY 02/21/20  Yes Cyril Mourning A, NP  ondansetron (ZOFRAN) 4 MG tablet Take 1 tablet (4 mg total) by mouth every 8 (eight) hours as needed for nausea or vomiting. 03/26/20  Yes Harris, Abigail, PA-C  dicyclomine (BENTYL) 20 MG tablet Take 1 tablet (20 mg total) by mouth 2 (two) times daily. 03/28/20   Tanda Rockers, PA-C  naproxen (NAPROSYN) 375 MG tablet Take 1 tablet (375 mg total) by mouth 2 (two) times daily with a meal. Patient not taking: Reported on 03/28/2020 03/26/20   Arthor Captain, PA-C  ondansetron (ZOFRAN ODT) 4 MG disintegrating tablet  Take 1 tablet (4 mg total) by mouth every 8 (eight) hours as needed for nausea or vomiting. 03/28/20   Hyman Hopes, Natasia Sanko, PA-C  potassium chloride (KLOR-CON) 10 MEQ tablet Take 1 tablet (10 mEq total) by mouth daily for 5 days. 03/28/20 04/02/20  Tanda Rockers, PA-C    Allergies    Patient has no known allergies.  Review of Systems   Review of Systems  Constitutional: Negative for chills and fever.  Respiratory: Negative for shortness of breath.   Cardiovascular: Negative for chest pain.  Gastrointestinal: Positive for abdominal pain, constipation, nausea and vomiting. Negative for diarrhea.  Genitourinary: Positive for menstrual problem.  Musculoskeletal: Negative for myalgias.  All other systems reviewed and are negative.   Physical Exam Updated Vital Signs BP (!) 110/94   Pulse 77   Temp 98.6 F (37 C) (Tympanic)   Resp 18   Ht 5\' 8"  (1.727 m)   Wt 95.3 kg   LMP 02/18/2020 (Exact Date)   SpO2 100%   BMI 31.93 kg/m   Physical Exam Vitals and nursing note reviewed.  Constitutional:      Appearance: She is not ill-appearing or diaphoretic.  HENT:     Head: Normocephalic and atraumatic.     Mouth/Throat:     Mouth: Mucous membranes are dry.  Eyes:     Conjunctiva/sclera: Conjunctivae normal.  Cardiovascular:     Rate and Rhythm: Normal rate and regular rhythm.     Pulses: Normal pulses.  Pulmonary:     Effort: Pulmonary effort is normal.     Breath sounds: Normal breath sounds. No wheezing, rhonchi or rales.  Abdominal:     Palpations: Abdomen is soft.     Tenderness: There is no abdominal tenderness. There is no guarding or rebound.     Comments: Soft, NTND, +BS throughout, no r/g/r, neg murphy's, neg mcburney's, no CVA TTP  Musculoskeletal:     Cervical back: Neck supple.  Skin:    General: Skin is warm and dry.  Neurological:     Mental Status: She is alert.     ED Results / Procedures / Treatments   Labs (all labs ordered are listed, but only abnormal  results are displayed) Labs Reviewed  BASIC METABOLIC PANEL - Abnormal; Notable for the following components:      Result Value   Potassium 3.2 (*)    Chloride 97 (*)    Glucose, Bld 102 (*)    All other components within normal limits  HEPATIC FUNCTION PANEL - Abnormal; Notable for the following components:  Total Protein 8.5 (*)    All other components within normal limits  URINALYSIS, ROUTINE W REFLEX MICROSCOPIC - Abnormal; Notable for the following components:   Color, Urine AMBER (*)    APPearance HAZY (*)    Specific Gravity, Urine 1.035 (*)    Bilirubin Urine SMALL (*)    Ketones, ur 20 (*)    Protein, ur 100 (*)    Bacteria, UA RARE (*)    All other components within normal limits  RESPIRATORY PANEL BY RT PCR (FLU A&B, COVID)  CBC  LIPASE, BLOOD  HCG, QUANTITATIVE, PREGNANCY  SAMPLE TO BLOOD BANK    EKG None  Radiology No results found.  Procedures Procedures (including critical care time)  Medications Ordered in ED Medications  sodium chloride 0.9 % bolus 1,000 mL (0 mLs Intravenous Stopped 03/28/20 1827)  ondansetron (ZOFRAN) injection 4 mg (4 mg Intravenous Given 03/28/20 1714)  alum & mag hydroxide-simeth (MAALOX/MYLANTA) 200-200-20 MG/5ML suspension 30 mL (30 mLs Oral Given 03/28/20 1819)    And  lidocaine (XYLOCAINE) 2 % viscous mouth solution 15 mL (15 mLs Oral Given 03/28/20 1819)  dicyclomine (BENTYL) capsule 10 mg (10 mg Oral Given 03/28/20 2000)  sodium chloride 0.9 % bolus 500 mL (500 mLs Intravenous New Bag/Given 03/28/20 2001)  potassium chloride SA (KLOR-CON) CR tablet 20 mEq (20 mEq Oral Given 03/28/20 2001)  metoCLOPramide (REGLAN) injection 10 mg (10 mg Intravenous Given 03/28/20 2001)    ED Course  I have reviewed the triage vital signs and the nursing notes.  Pertinent labs & imaging results that were available during my care of the patient were reviewed by me and considered in my medical decision making (see chart for details).      MDM Rules/Calculators/A&P                          24 year old female who presents to the ED today with recurrent nausea, vomiting, blood-tinged emesis after being seen in the ED 2 days ago.  Began having abdominal pain, nausea, vomiting 5 days ago.  She had a negative CT scan and ultrasound done 2 days ago in the ED with suspected symptoms related to viral gastroenteritis.  She was prescribed oral Zofran and naproxen however states she has been unable to keep anything down.  On arrival to the ED patient is afebrile, nontachycardic nontachypneic.  She appears to be in no acute distress.  Her abdomen is soft without rebound or guarding.  Doubt acute abdomen at this time.  Do not feel she needs repeat CT imaging today.  We will plan to hydrate with IV fluids, antiemetics, recheck labs.  Also swab for Covid at this time as patient is unvaccinated and having GI symptoms.  Also reported during triage that she has not had a period in over a month, she had a negative pelvic ultrasound done 2 days ago with a negative pregnancy test.  We will repeat at this time.  CBC without leukocytosis.  Hemoglobin stable 13.4 BMP with a potassium of 3.2, will replete once patient is able to tolerate oral fluids.  Chloride 97, no other acute findings today. LFTs unremarkable. Lipase 23. Covid negative.  Unable to provide a urine sample so pregnancy test was changed hCG quant which is returned at 1.   Urinalysis with increased spec gravity, ketones, protein.  No infection.   Nursing staff informed that pt requesting something for "Acid reflux." GI cocktail provided however pt vomited it up.  Will provide IV reglan, oral bentyl for cramping, and KDUR and reassess.   Pt able to tolerate fluids after IV reglan. Will plan to dispo at this time. She did not have ODT zofran at home which I think will help as opposed to oral zofran. Pt instructed to wait 30 minutes prior to trying to eat or drink anything. Will also provide  bentyl for symptomatic relief and kdur. Pt instructed to have potassium level rechecked in 1-2 weeks. She is in agreement with plan and stable for discharge home.   This note was prepared using Dragon voice recognition software and may include unintentional dictation errors due to the inherent limitations of voice recognition software.  Final Clinical Impression(s) / ED Diagnoses Final diagnoses:  Viral gastroenteritis  Hypokalemia    Rx / DC Orders ED Discharge Orders         Ordered    ondansetron (ZOFRAN ODT) 4 MG disintegrating tablet  Every 8 hours PRN        03/28/20 2113    dicyclomine (BENTYL) 20 MG tablet  2 times daily        03/28/20 2113    potassium chloride (KLOR-CON) 10 MEQ tablet  Daily        03/28/20 2113           Discharge Instructions     Please pick up medication and take as needed. I have prescribed nausea medication that dissolved on your tongue - it is recommended to take this 30 minutes before trying to eat or drink anything.  I have also prescribed medication that helps with abdominal pain/cramping.  Your potassium was mildly decreased today - please take potassium supplements daily for the next 5 days as indicated. You will need to have your potassium level rechecked in 1 weeks time.   Follow up with your PCP regarding your ED visit  Return to the ED IMMEDIATELY for any worsening symptoms       Tanda Rockers, Cordelia Poche 03/28/20 2119    Bethann Berkshire, MD 03/30/20 [phone]

## 2020-03-28 NOTE — ED Notes (Signed)
Complaining of acid reflux, PA notified.

## 2020-05-14 ENCOUNTER — Telehealth: Payer: Self-pay | Admitting: *Deleted

## 2020-05-14 ENCOUNTER — Telehealth: Payer: Self-pay | Admitting: Adult Health

## 2020-05-14 MED ORDER — METRONIDAZOLE 0.75 % VA GEL: 1.0000 | Freq: Every day | VAGINAL | 0 refills | Status: DC

## 2020-05-14 MED ORDER — FLUCONAZOLE 150 MG PO TABS: ORAL_TABLET | ORAL | 1 refills | Status: DC

## 2020-05-14 NOTE — Telephone Encounter (Signed)
Will rx diflucan and metrogel 

## 2020-05-14 NOTE — Addendum Note (Signed)
Addended by: Cyril Mourning A on: 05/14/2020 05:15 PM   Modules accepted: Orders

## 2020-05-14 NOTE — Telephone Encounter (Signed)
Pt requesting meds for BV, no available appointments, please advise  Ok to order meds or need to be seen?  Please advise & notify pt (told pt to check her MyChart later today or check with the pharmacy)   [facility]

## 2020-05-14 NOTE — Telephone Encounter (Signed)
Patient is having discharge. No odor at this time but has recently finished her period.   Requesting Metrogel and Diflucan sent to pharmacy. Advised to check back with pharmacy later today.

## 2020-05-30 ENCOUNTER — Emergency Department (HOSPITAL_COMMUNITY)
Admission: EM | Admit: 2020-05-30 | Discharge: 2020-05-30 | Disposition: A | Discharge status: Home / Self Care | Payer: Medicaid Other | Attending: Emergency Medicine | Admitting: Emergency Medicine

## 2020-05-30 ENCOUNTER — Other Ambulatory Visit: Payer: Self-pay

## 2020-05-30 ENCOUNTER — Encounter (HOSPITAL_COMMUNITY): Payer: Self-pay

## 2020-05-30 DIAGNOSIS — F1590 Other stimulant use, unspecified, uncomplicated: Secondary | ICD-10-CM | POA: Diagnosis not present

## 2020-05-30 DIAGNOSIS — R112 Nausea with vomiting, unspecified: Secondary | ICD-10-CM | POA: Insufficient documentation

## 2020-05-30 DIAGNOSIS — Z20822 Contact with and (suspected) exposure to covid-19: Secondary | ICD-10-CM | POA: Diagnosis not present

## 2020-05-30 DIAGNOSIS — R197 Diarrhea, unspecified: Secondary | ICD-10-CM | POA: Diagnosis not present

## 2020-05-30 LAB — RESP PANEL BY RT-PCR (FLU A&B, COVID) ARPGX2
Influenza A by PCR: NEGATIVE
Influenza B by PCR: NEGATIVE
SARS Coronavirus 2 by RT PCR: NEGATIVE

## 2020-05-30 MED ORDER — ONDANSETRON 8 MG PO TBDP
8.0000 mg | ORAL_TABLET | Freq: Once | ORAL | Status: AC
  Administered 2020-05-30: 17:00:00 8 mg via ORAL
  Filled 2020-05-30: qty 1

## 2020-05-30 NOTE — Discharge Instructions (Signed)
Start with a clear liquid diet today then gradually advance to regular foods after a day or 2.  Use Tylenol for pain or fever.  See the doctor of your choice if not better in 3 to 4 days.

## 2020-05-30 NOTE — ED Triage Notes (Signed)
Pt to er, pt c/o abd pain, vomiting, diarrhea, body aches, fever and chills since this morning, states that she has a family member with covid, denies vaccine.

## 2020-05-30 NOTE — ED Notes (Signed)
Vomiting and loose stools since 11am today. States she has chills but sweaty. Afebrile. No medication today.

## 2020-05-30 NOTE — ED Provider Notes (Signed)
[facility] EMERGENCY DEPARTMENT Provider Note   CSN: [id number] Arrival date & time: 05/30/20  1459     History No chief complaint on file.   Courtney Zhang is a 24 y.o. female.  HPI She presents for evaluation of nausea, vomiting and diarrhea which started today.  She is concerned because a family member of hers is isolated at home, or with Covid infection.  Patient denies fever, cough, rhinorrhea, weakness or dizziness.  She works a job at a [employer].  She has not had a Covid or flu vaccine.  There are no other known modifying factors.    Past Medical History:  Diagnosis Date  . Abdominal cramping 07/14/2014  . Amenorrhea 07/14/2014  . Chlamydia   . Contraceptive education 11/07/2013  . GERD (gastroesophageal reflux disease)   . Herpes genitalia   . PCO (polycystic ovaries) 08/02/2014  . Pelvic pain in female 07/14/2014  . Positive pregnancy test 08/02/2014    Patient Active Problem List   Diagnosis Date Noted  . BV (bacterial vaginosis) 10/28/2019  . Vaginal odor 10/28/2019  . Vaginal discharge 10/28/2019  . 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 Previous cesarean section 03/06/2017  . PCO (polycystic ovaries) 08/02/2014  . Pelvic pain in female 07/14/2014  . Amenorrhea 07/14/2014    Past Surgical History:  Procedure Laterality Date  . CESAREAN SECTION    . CHOLECYSTECTOMY  10/01/2016  . DILATION AND CURETTAGE OF UTERUS  12/13/2011?  . INDUCED ABORTION  12/05/2017     OB History    Gravida  4   Para  2   Term  2   Preterm      AB  2   Living  2     SAB  1   IAB  1   Ectopic      Multiple  0   Live Births  2           Family History  Problem Relation Age of Onset  . Asthma Brother   . Diabetes Maternal Grandmother   . Hypertension Maternal Grandmother   . Diabetes Maternal Grandfather   . Diabetes Paternal Grandmother   . Asthma Brother   . Asthma Brother   . Asthma Brother     Social History   Tobacco  Use  . Smoking status: Never Smoker  . Smokeless tobacco: Never Used  Vaping Use  . Vaping Use: Former  Substance Use Topics  . Alcohol use: Not Currently  . Drug use: Yes    Types: Marijuana    Home Medications Prior to Admission medications   Medication Sig Start Date End Date Taking? Authorizing Provider  dicyclomine (BENTYL) 20 MG tablet Take 1 tablet (20 mg total) by mouth 2 (two) times daily. 03/28/20   Hyman Hopes, Margaux, PA-C  fluconazole (DIFLUCAN) 150 MG tablet TAKE 1 TABLET BY MOUTH NOW AND 1 TABLET IN 3 DAYS IF NEEDED 05/14/20   Cyril Mourning A, NP  metroNIDAZOLE (METROGEL VAGINAL) 0.75 % vaginal gel Place 1 Applicatorful vaginally at bedtime. 05/14/20   Adline Potter, NP  naproxen (NAPROSYN) 375 MG tablet Take 1 tablet (375 mg total) by mouth 2 (two) times daily with a meal. Patient not taking: Reported on 03/28/2020 03/26/20   Arthor Captain, PA-C  ondansetron (ZOFRAN ODT) 4 MG disintegrating tablet Take 1 tablet (4 mg total) by mouth every 8 (eight) hours as needed for nausea or vomiting. 03/28/20   Venter, Margaux, PA-C  ondansetron (ZOFRAN) 4 MG  tablet Take 1 tablet (4 mg total) by mouth every 8 (eight) hours as needed for nausea or vomiting. 03/26/20   Harris, Cammy Copa, PA-C  potassium chloride (KLOR-CON) 10 MEQ tablet Take 1 tablet (10 mEq total) by mouth daily for 5 days. 03/28/20 04/02/20  Tanda Rockers, PA-C    Allergies    Patient has no known allergies.  Review of Systems   Review of Systems  All other systems reviewed and are negative.   Physical Exam Updated Vital Signs BP (!) 130/99 (BP Location: Right Arm)   Pulse 82   Temp 98 F (36.7 C) (Oral)   Resp 18   Ht 5\' 8"  (1.727 m)   Wt 99.8 kg   SpO2 100%   BMI 33.45 kg/m   Physical Exam Vitals and nursing note reviewed.  Constitutional:      General: She is not in acute distress.    Appearance: She is well-developed and well-nourished. She is obese. She is not ill-appearing,  toxic-appearing or diaphoretic.  HENT:     Head: Normocephalic and atraumatic.     Right Ear: External ear normal.     Left Ear: External ear normal.  Eyes:     Extraocular Movements: EOM normal.     Conjunctiva/sclera: Conjunctivae normal.     Pupils: Pupils are equal, round, and reactive to light.  Neck:     Trachea: Phonation normal.  Cardiovascular:     Rate and Rhythm: Normal rate and regular rhythm.     Heart sounds: Normal heart sounds.  Pulmonary:     Effort: Pulmonary effort is normal. No respiratory distress.     Breath sounds: Normal breath sounds. No stridor.  Chest:     Chest wall: No bony tenderness.  Abdominal:     General: There is no distension.     Palpations: Abdomen is soft.     Tenderness: There is no abdominal tenderness.  Musculoskeletal:        General: Normal range of motion.     Cervical back: Normal range of motion and neck supple.  Skin:    General: Skin is warm, dry and intact.  Neurological:     Mental Status: She is alert and oriented to person, place, and time.     Cranial Nerves: No cranial nerve deficit.     Sensory: No sensory deficit.     Motor: No abnormal muscle tone.     Coordination: Coordination normal.  Psychiatric:        Mood and Affect: Mood and affect and mood normal.        Behavior: Behavior normal.        Thought Content: Thought content normal.        Judgment: Judgment normal.     ED Results / Procedures / Treatments   Labs (all labs ordered are listed, but only abnormal results are displayed) Labs Reviewed  RESP PANEL BY RT-PCR (FLU A&B, COVID) [id number]    EKG None  Radiology No results found.  Procedures Procedures (including critical care time)  Medications Ordered in ED Medications  ondansetron (ZOFRAN-ODT) disintegrating tablet 8 mg (has no administration in time range)    ED Course  I have reviewed the triage vital signs and the nursing notes.  Pertinent labs & imaging results that were available  during my care of the patient were reviewed by me and considered in my medical decision making (see chart for details).    MDM Rules/Calculators/A&P  Patient Vitals for the past 24 hrs:  BP Temp Temp src Pulse Resp SpO2 Height Weight  05/30/20 1506 -- -- -- -- -- -- 5\' 8"  (1.727 m) 99.8 kg  05/30/20 1503 (!) 130/99 98 F (36.7 C) Oral 82 18 100 % -- --    4:34 PM Reevaluation with update and discussion. After initial assessment and treatment, an updated evaluation reveals no change in clinical status, findings discussed with patient all questions were answered. 06/01/20   Medical Decision Making:  This patient is presenting for evaluation of short-term illness with nausea, vomiting and diarrhea, which does not require a range of treatment options, and is not a complaint that involves a high risk of morbidity and mortality. The differential diagnoses include gastroenteritis, food poisoning, Covid infection. I decided to review old records, and in summary Young female presenting with acute illness, with primarily gastrointestinal symptoms.  I did not require additional historical information from anyone.   Critical Interventions-clinical evaluation, Covid and influenza testing, discussion with patient  After These Interventions, the Patient was reevaluated and was found stable for discharge.  Patient with acute illness, relatively normal vital signs, and negative screening for Covid or influenza.  No indication for further treatment in the ED or hospitalization.  CRITICAL CARE-no Performed by: Mancel Bale  Nursing Notes Reviewed/ Care Coordinated Applicable Imaging Reviewed Interpretation of Laboratory Data incorporated into ED treatment  The patient appears reasonably screened and/or stabilized for discharge and I doubt any other medical condition or other [facility] requiring further screening, evaluation, or treatment in the ED at this time prior to  discharge.  Plan: Home Medications-symptomatic OTC treatment; Home Treatments-gradual advance diet; return here if the recommended treatment, does not improve the symptoms; Recommended follow up-PCP, as needed     Final Clinical Impression(s) / ED Diagnoses Final diagnoses:  Nausea vomiting and diarrhea    Rx / DC Orders ED Discharge Orders    None       [facility], MD 05/30/20 1636

## 2020-08-17 ENCOUNTER — Other Ambulatory Visit: Payer: Medicaid Other

## 2020-10-08 ENCOUNTER — Other Ambulatory Visit: Payer: Self-pay

## 2020-10-08 ENCOUNTER — Emergency Department (HOSPITAL_COMMUNITY)
Admission: EM | Admit: 2020-10-08 | Discharge: 2020-10-08 | Disposition: A | Discharge status: Home / Self Care | Payer: Medicaid Other

## 2020-11-05 ENCOUNTER — Telehealth: Payer: Medicaid Other | Admitting: Physician Assistant

## 2020-11-05 DIAGNOSIS — B001 Herpesviral vesicular dermatitis: Secondary | ICD-10-CM

## 2020-11-05 MED ORDER — VALACYCLOVIR HCL 1 G PO TABS: 1000.0000 mg | ORAL_TABLET | Freq: Two times a day (BID) | ORAL | 0 refills | Status: AC

## 2020-11-05 NOTE — Progress Notes (Signed)
We are sorry that you are not feeling well.  Here is how we plan to help!  Based on what you have shared with me it does look like you have a viral infection.    Most cold sores or fever blisters are small fluid filled blisters around the mouth caused by herpes simplex virus.  The most common strain of the virus causing cold sores is herpes simplex virus 1.  It can be spread by skin contact, sharing eating utensils, or even sharing towels.  Cold sores are contagious to other people until dry. (Approximately 5-7 days).  Wash your hands. You can spread the virus to your eyes through handling your contact lenses after touching the lesions.  Most people experience pain at the sight or tingling sensations in their lips that may begin before the ulcers erupt.  Herpes simplex is treatable but not curable.  It may lie dormant for a long time and then reappear due to stress or prolonged sun exposure.  Many patients have success in treating their cold sores with an over the counter topical called Abreva.  You may apply the cream up to 5 times daily (maximum 10 days) until healing occurs.  If you would like to use an oral antiviral medication to speed the healing of your cold sore, I have sent a prescription to your local pharmacy: Valacyclovir 1 gram Take 1 tablet twice daily for 5 days    HOME CARE:   Wash your hands frequently.  Do not pick at or rub the sore.  Don't open the blisters.  Avoid kissing other people during this time.  Avoid sharing drinking glasses, eating utensils, or razors.  Do not handle contact lenses unless you have thoroughly washed your hands with soap and warm water!  Avoid oral sex during this time.  Herpes from sores on your mouth can spread to your partner's genital area.  Avoid contact with anyone who has eczema or a weakened immune system.  Cold sores are often triggered by exposure to intense sunlight, use a lip balm containing a sunscreen (SPF 30 or  higher).  GET HELP RIGHT AWAY IF:   Blisters look infected.  Blisters occur near or in the eye.  Symptoms last longer than 10 days.  Your symptoms become worse.  MAKE SURE YOU:   Understand these instructions.  Will watch your condition.  Will get help right away if you are not doing well or get worse.    Your e-visit answers were reviewed by a board certified advanced clinical practitioner to complete your personal care plan.  Depending upon the condition, your plan could have  Included both over the counter or prescription medications.    Please review your pharmacy choice.  Be sure that the pharmacy you have chosen is open so that you can pick up your prescription now.  If there is a problem you can message your provider in MyChart to have the prescription routed to another pharmacy.    Your safety is important to [place].  If you have drug allergies check our prescription carefully.  For the next 24 hours you can use MyChart to ask questions about today's visit, request a non-urgent call back, or ask for a work or school excuse from your e-visit provider.  You will get an email in the next two days asking about your experience.  I hope that your e-visit has been valuable and will speed your recovery.  I provided 5 minutes of non face-to-face time during  this encounter for chart review and documentation.

## 2020-11-14 ENCOUNTER — Emergency Department (HOSPITAL_COMMUNITY)
Admission: EM | Admit: 2020-11-14 | Discharge: 2020-11-14 | Disposition: A | Discharge status: Home / Self Care | Payer: Medicaid Other | Attending: Emergency Medicine | Admitting: Emergency Medicine

## 2020-11-14 ENCOUNTER — Encounter (HOSPITAL_COMMUNITY): Payer: Self-pay

## 2020-11-14 ENCOUNTER — Other Ambulatory Visit: Payer: Self-pay

## 2020-11-14 DIAGNOSIS — Q829 Congenital malformation of skin, unspecified: Secondary | ICD-10-CM

## 2020-11-14 DIAGNOSIS — R21 Rash and other nonspecific skin eruption: Secondary | ICD-10-CM | POA: Diagnosis present

## 2020-11-14 DIAGNOSIS — L989 Disorder of the skin and subcutaneous tissue, unspecified: Secondary | ICD-10-CM | POA: Diagnosis not present

## 2020-11-14 NOTE — ED Provider Notes (Signed)
[facility] EMERGENCY DEPARTMENT Provider Note   CSN: [id number] Arrival date & time: 11/14/20  2007     History Chief Complaint  Patient presents with  . Mass    Courtney Zhang is a 25 y.o. female.  Patient complains of swelling to upper lip that is been there for a long time.  It does not hurt  The history is provided by the patient and medical records. No language interpreter was used.  Rash Location:  Face Facial rash location:  Face Quality: not blistering   Severity:  Mild Onset quality:  Gradual Timing:  Constant Associated symptoms: no abdominal pain, no diarrhea, no fatigue and no headaches        Past Medical History:  Diagnosis Date  . Abdominal cramping 07/14/2014  . Amenorrhea 07/14/2014  . Chlamydia   . Contraceptive education 11/07/2013  . GERD (gastroesophageal reflux disease)   . Herpes genitalia   . PCO (polycystic ovaries) 08/02/2014  . Pelvic pain in female 07/14/2014  . Positive pregnancy test 08/02/2014    Patient Active Problem List   Diagnosis Date Noted  . BV (bacterial vaginosis) 10/28/2019  . Vaginal odor 10/28/2019  . Vaginal discharge 10/28/2019  . 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 Previous cesarean section 03/06/2017  . PCO (polycystic ovaries) 08/02/2014  . Pelvic pain in female 07/14/2014  . Amenorrhea 07/14/2014    Past Surgical History:  Procedure Laterality Date  . CESAREAN SECTION    . CHOLECYSTECTOMY  10/01/2016  . DILATION AND CURETTAGE OF UTERUS  12/13/2011?  . INDUCED ABORTION  12/05/2017     OB History    Gravida  4   Para  2   Term  2   Preterm      AB  2   Living  2     SAB  1   IAB  1   Ectopic      Multiple  0   Live Births  2           Family History  Problem Relation Age of Onset  . Asthma Brother   . Diabetes Maternal Grandmother   . Hypertension Maternal Grandmother   . Diabetes Maternal Grandfather   . Diabetes Paternal Grandmother   . Asthma Brother   .  Asthma Brother   . Asthma Brother     Social History   Tobacco Use  . Smoking status: Never Smoker  . Smokeless tobacco: Never Used  Vaping Use  . Vaping Use: Every day  Substance Use Topics  . Alcohol use: Not Currently  . Drug use: Yes    Types: Marijuana    Home Medications Prior to Admission medications   Medication Sig Start Date End Date Taking? Authorizing Provider  dicyclomine (BENTYL) 20 MG tablet Take 1 tablet (20 mg total) by mouth 2 (two) times daily. Patient not taking: No sig reported 03/28/20   Tanda Rockers, PA-C  fluconazole (DIFLUCAN) 150 MG tablet TAKE 1 TABLET BY MOUTH NOW AND 1 TABLET IN 3 DAYS IF NEEDED Patient not taking: No sig reported 05/14/20   Cyril Mourning A, NP  metroNIDAZOLE (METROGEL VAGINAL) 0.75 % vaginal gel Place 1 Applicatorful vaginally at bedtime. Patient not taking: No sig reported 05/14/20   Cyril Mourning A, NP  naproxen (NAPROSYN) 375 MG tablet Take 1 tablet (375 mg total) by mouth 2 (two) times daily with a meal. Patient not taking: No sig reported 03/26/20   Arthor Captain, PA-C  ondansetron (ZOFRAN ODT)  4 MG disintegrating tablet Take 1 tablet (4 mg total) by mouth every 8 (eight) hours as needed for nausea or vomiting. Patient not taking: No sig reported 03/28/20   Hyman Hopes, Margaux, PA-C  ondansetron (ZOFRAN) 4 MG tablet Take 1 tablet (4 mg total) by mouth every 8 (eight) hours as needed for nausea or vomiting. Patient not taking: Reported on 11/14/2020 03/26/20   Arthor Captain, PA-C  potassium chloride (KLOR-CON) 10 MEQ tablet Take 1 tablet (10 mEq total) by mouth daily for 5 days. Patient not taking: Reported on 11/14/2020 03/28/20 04/02/20  Tanda Rockers, PA-C    Allergies    Patient has no known allergies.  Review of Systems   Review of Systems  Constitutional: Negative for appetite change and fatigue.  HENT: Negative for congestion, ear discharge and sinus pressure.        Swelling to left  Eyes: Negative for  discharge.  Respiratory: Negative for cough.   Cardiovascular: Negative for chest pain.  Gastrointestinal: Negative for abdominal pain and diarrhea.  Genitourinary: Negative for frequency and hematuria.  Musculoskeletal: Negative for back pain.  Skin: Positive for rash.  Neurological: Negative for seizures and headaches.  Psychiatric/Behavioral: Negative for hallucinations.    Physical Exam Updated Vital Signs BP (!) 140/95 (BP Location: Right Arm)   Pulse 84   Temp 98.4 F (36.9 C) (Oral)   Resp 18   Ht 5\' 8"  (1.727 m)   Wt 95.3 kg   SpO2 99%   BMI 31.93 kg/m   Physical Exam Vitals and nursing note reviewed.  Constitutional:      Appearance: She is well-developed.  HENT:     Head: Normocephalic.     Nose: Nose normal.     Mouth/Throat:     Comments: Small cyst about 2 to 3 mm in the left side of her upper lip.  No infection seen Eyes:     Conjunctiva/sclera: Conjunctivae normal.  Neck:     Trachea: No tracheal deviation.  Pulmonary:     Effort: Pulmonary effort is normal.  Musculoskeletal:        General: Normal range of motion.  Skin:    General: Skin is warm.  Neurological:     Mental Status: She is alert and oriented to person, place, and time.  Psychiatric:        Mood and Affect: Mood normal.     ED Results / Procedures / Treatments   Labs (all labs ordered are listed, but only abnormal results are displayed) Labs Reviewed - No data to display  EKG None  Radiology No results found.  Procedures Procedures   Medications Ordered in ED Medications - No data to display  ED Course  I have reviewed the triage vital signs and the nursing notes.  Pertinent labs & imaging results that were available during my care of the patient were reviewed by me and considered in my medical decision making (see chart for details).    MDM Rules/Calculators/A&P                          Patient with a small cyst to her upper left side of her lip.  She was  instructed that nothing need to be done unless she has pain or swelling.  She is also could follow-up with dermatologist if she wanted Final Clinical Impression(s) / ED Diagnoses Final diagnoses:  Skin anomaly    Rx / DC Orders ED Discharge Orders  None       Bethann Berkshire, MD 11/14/20 2056

## 2020-11-14 NOTE — Discharge Instructions (Addendum)
You have a small cyst on your upper lip.  If it is not aggravating you I would not do anything about it.  You are welcome to go follow-up with a dermatologist if you have other questions

## 2020-11-14 NOTE — ED Triage Notes (Signed)
Pt to er, pt states that about a month ago she fell in the water and had a bump on her lip, states that it looked like some bit her, states that she was seen at urgent care and told it was an allergic reaction, they gave her some medication and it got a little bit better, but her lip still has some swelling so she would like to get it checked.

## 2020-11-14 NOTE — ED Provider Notes (Signed)
Emergency Medicine Provider Triage Evaluation Note  Courtney Zhang , a 25 y.o. female  was evaluated in triage.  Pt complains of a bump on her lip.  She says that about a month ago she fell in a water fall and had a bump on the left side of her lip.  She was treated with valtrex and it didn't get better.  She wonders why its not better.   Review of Systems  Positive: Sore on lip Negative: fevers  Physical Exam  There were no vitals taken for this visit. Gen:   Awake, no distress   Resp:  Normal effort  MSK:   Moves extremities without difficulty  Other:  Small bump on left lip.   Medical Decision Making  Medically screening exam initiated at 8:21 PM.  Appropriate orders placed.  Jannell Cheramie was informed that the remainder of the evaluation will be completed by another provider, this initial triage assessment does not replace that evaluation, and the importance of remaining in the ED until their evaluation is complete.     Norman Clay 11/14/20 2025    Bethann Berkshire, MD 11/15/20 1045

## 2020-12-07 ENCOUNTER — Telehealth: Payer: Medicaid Other | Admitting: Physician Assistant

## 2020-12-07 DIAGNOSIS — B9689 Other specified bacterial agents as the cause of diseases classified elsewhere: Secondary | ICD-10-CM

## 2020-12-07 DIAGNOSIS — N760 Acute vaginitis: Secondary | ICD-10-CM

## 2020-12-07 MED ORDER — METRONIDAZOLE 500 MG PO TABS: 500.0000 mg | ORAL_TABLET | Freq: Two times a day (BID) | ORAL | 0 refills | Status: AC

## 2020-12-07 NOTE — Progress Notes (Signed)

## 2021-01-08 ENCOUNTER — Telehealth: Payer: Medicaid Other | Admitting: Physician Assistant

## 2021-01-08 DIAGNOSIS — N898 Other specified noninflammatory disorders of vagina: Secondary | ICD-10-CM

## 2021-01-08 DIAGNOSIS — R102 Pelvic and perineal pain: Secondary | ICD-10-CM

## 2021-01-08 DIAGNOSIS — N941 Unspecified dyspareunia: Secondary | ICD-10-CM

## 2021-01-08 NOTE — Progress Notes (Signed)
duplicate

## 2021-01-08 NOTE — Progress Notes (Signed)
Based on what you shared with me, I feel your condition warrants further evaluation and I recommend that you be seen in a face to face visit.  Simple UTI typically do not cause pain with intercourse or yellow/green vaginal discharge. Giving combination of vaginal and urinary symptoms, you need evaluation including examination and testing of urine/vaginal swab to determine all causes of symptoms so most appropriate treatment can be given.    NOTE: There will be NO CHARGE for this eVisit   If you are having a true medical emergency please call 911.      For an urgent face to face visit, [place] [facility] has six urgent care centers for your convenience:     [facility] Health Urgent Care Center at [facility] Directions [phone] [address] [place], [place] [address]    [facility] Health Urgent Care Center [facility]) Get Driving Directions [phone] [address] [place], [place] [address]  [facility] Health Urgent Care Center [facility]) Get Driving Directions [phone] [address] [place],  [place]  [address]  [facility] Health Urgent Care at [facility] Get Driving Directions [phone] [address] [place], [place] [address]   [facility] Health Urgent Care at [facility] Get Driving Directions  [phone] [address] [place], [place] [address]   [facility] Health Urgent Care at [facility] Directions [phone] [address] [place], [place] [address]  Your MyChart E-visit questionnaire answers were reviewed by a board certified advanced clinical practitioner to complete your personal care plan based on your specific symptoms.  Thank you for using e-Visits.

## 2021-01-13 ENCOUNTER — Telehealth: Payer: Medicaid Other | Admitting: Physician Assistant

## 2021-01-13 DIAGNOSIS — B9689 Other specified bacterial agents as the cause of diseases classified elsewhere: Secondary | ICD-10-CM

## 2021-01-13 DIAGNOSIS — N760 Acute vaginitis: Secondary | ICD-10-CM | POA: Diagnosis not present

## 2021-01-13 MED ORDER — METRONIDAZOLE 500 MG PO TABS: 500.0000 mg | ORAL_TABLET | Freq: Two times a day (BID) | ORAL | 0 refills | Status: DC

## 2021-01-13 NOTE — Progress Notes (Signed)

## 2021-01-13 NOTE — Progress Notes (Signed)
I have spent 5 minutes in review of e-visit questionnaire, review and updating patient chart, medical decision making and response to patient.   Yoltzin Barg Cody Harwood Nall, PA-C    

## 2021-01-15 ENCOUNTER — Telehealth: Payer: Medicaid Other | Admitting: Physician Assistant

## 2021-01-15 DIAGNOSIS — N760 Acute vaginitis: Secondary | ICD-10-CM

## 2021-01-15 NOTE — Progress Notes (Signed)
Based on what you shared with me, I feel your condition warrants further evaluation and I recommend that you be seen for a face to face visit.  Please contact your primary care physician practice to be seen. Many offices offer virtual options to be seen via video if you are not comfortable going in person to a medical facility at this time.  Since you have already been treated in the last week for vaginal complaint and are still noting symptoms as well as new symptoms, you will need to seek an in-person evaluation as it is inappropriate to treat via e-visit or virtual urgent care visit. You need urine and vaginal testing to make sure you get appropriate treatment.   NOTE: You will NOT be charged for this eVisit.  If you do not have a PCP, [facility] offers a free physician referral service available at [phone]. Our trained staff has the experience, knowledge and resources to put you in touch with a physician who is right for you.    If you are having a true medical emergency please call 911.   Your e-visit answers were reviewed by a board certified advanced clinical practitioner to complete your personal care plan.  Thank you for using e-Visits.

## 2021-02-07 ENCOUNTER — Other Ambulatory Visit: Payer: Medicaid Other | Admitting: Women's Health

## 2021-02-14 ENCOUNTER — Telehealth: Payer: Medicaid Other | Admitting: Physician Assistant

## 2021-02-14 ENCOUNTER — Other Ambulatory Visit: Payer: Self-pay | Admitting: Physician Assistant

## 2021-02-14 DIAGNOSIS — B373 Candidiasis of vulva and vagina: Secondary | ICD-10-CM

## 2021-02-14 DIAGNOSIS — B3731 Acute candidiasis of vulva and vagina: Secondary | ICD-10-CM

## 2021-02-14 MED ORDER — FLUCONAZOLE 150 MG PO TABS: 150.0000 mg | ORAL_TABLET | Freq: Once | ORAL | 0 refills | Status: AC

## 2021-02-14 NOTE — Progress Notes (Signed)

## 2021-03-07 ENCOUNTER — Ambulatory Visit: Payer: Medicaid Other | Admitting: Obstetrics & Gynecology

## 2021-03-11 ENCOUNTER — Ambulatory Visit (INDEPENDENT_AMBULATORY_CARE_PROVIDER_SITE_OTHER): Payer: Medicaid Other | Admitting: Obstetrics & Gynecology

## 2021-03-11 ENCOUNTER — Encounter: Payer: Self-pay | Admitting: Obstetrics & Gynecology

## 2021-03-11 ENCOUNTER — Other Ambulatory Visit (HOSPITAL_COMMUNITY)
Admission: RE | Admit: 2021-03-11 | Discharge: 2021-03-11 | Disposition: A | Discharge status: Home / Self Care | Payer: Medicaid Other | Source: Ambulatory Visit | Attending: Obstetrics & Gynecology | Admitting: Obstetrics & Gynecology

## 2021-03-11 ENCOUNTER — Other Ambulatory Visit: Payer: Self-pay

## 2021-03-11 VITALS — BP 113/81 | HR 76 | Ht 68.0 in | Wt 216.0 lb

## 2021-03-11 DIAGNOSIS — Z01419 Encounter for gynecological examination (general) (routine) without abnormal findings: Secondary | ICD-10-CM | POA: Insufficient documentation

## 2021-03-11 NOTE — Progress Notes (Signed)
Subjective:     Courtney Zhang is a 25 y.o. female here for a routine exam.  Patient's last menstrual period was 02/23/2021. [name] Birth Control Method:  condoms Menstrual Calendar(currently): regular  Current complaints: none.   Current acute medical issues:  none   Recent Gynecologic History Patient's last menstrual period was 02/23/2021. Last Pap: unsure Last mammogram: ,  n/a  Past Medical History:  Diagnosis Date   Abdominal cramping 07/14/2014   Amenorrhea 07/14/2014   Chlamydia    Contraceptive education 11/07/2013   GERD (gastroesophageal reflux disease)    Herpes genitalia    PCO (polycystic ovaries) 08/02/2014   Pelvic pain in female 07/14/2014   Positive pregnancy test 08/02/2014    Past Surgical History:  Procedure Laterality Date   CESAREAN SECTION     CHOLECYSTECTOMY  10/01/2016   DILATION AND CURETTAGE OF UTERUS  12/13/2011?   INDUCED ABORTION  12/05/2017    OB History     Gravida  4   Para  2   Term  2   Preterm      AB  2   Living  2      SAB  1   IAB  1   Ectopic      Multiple  0   Live Births  2           Social History   Socioeconomic History   Marital status: Single    Spouse name: Not on file   Number of children: Not on file   Years of education: Not on file   Highest education level: Not on file  Occupational History   Not on file  Tobacco Use   Smoking status: Never   Smokeless tobacco: Never  Vaping Use   Vaping Use: Every day  Substance and Sexual Activity   Alcohol use: Not Currently   Drug use: Yes    Types: Marijuana   Sexual activity: Yes    Birth control/protection: None, Condom  Other Topics Concern   Not on file  Social History Narrative   Not on file   Social Determinants of Health   Financial Resource Strain: Medium Risk   Difficulty of Paying Living Expenses: Somewhat hard  Food Insecurity: No Food Insecurity   Worried About Programme researcher, broadcasting/film/video in the Last Year: Never true   Ran Out of Food in  the Last Year: Never true  Transportation Needs: No Transportation Needs   Lack of Transportation (Medical): No   Lack of Transportation (Non-Medical): No  Physical Activity: Insufficiently Active   Days of Exercise per Week: 2 days   Minutes of Exercise per Session: 30 min  Stress: No Stress Concern Present   Feeling of Stress : Not at all  Social Connections: Socially Isolated   Frequency of Communication with Friends and Family: Three times a week   Frequency of Social Gatherings with Friends and Family: Twice a week   Attends Religious Services: Never   Database administrator or Organizations: No   Attends Engineer, structural: Never   Marital Status: Never married    Family History  Problem Relation Age of Onset   Asthma Brother    Diabetes Maternal Grandmother    Hypertension Maternal Grandmother    Diabetes Maternal Grandfather    Diabetes Paternal Grandmother    Asthma Brother    Asthma Brother    Asthma Brother      Current Outpatient Medications:    acyclovir (ZOVIRAX) 400 MG  tablet, Take 400 mg by mouth 3 (three) times daily. (Patient not taking: Reported on 03/11/2021), Disp: , Rfl:   Review of Systems  Review of Systems  Constitutional: Negative for fever, chills, weight loss, malaise/fatigue and diaphoresis.  HENT: Negative for hearing loss, ear pain, nosebleeds, congestion, sore throat, neck pain, tinnitus and ear discharge.   Eyes: Negative for blurred vision, double vision, photophobia, pain, discharge and redness.  Respiratory: Negative for cough, hemoptysis, sputum production, shortness of breath, wheezing and stridor.   Cardiovascular: Negative for chest pain, palpitations, orthopnea, claudication, leg swelling and PND.  Gastrointestinal: negative for abdominal pain. Negative for heartburn, nausea, vomiting, diarrhea, constipation, blood in stool and melena.  Genitourinary: Negative for dysuria, urgency, frequency, hematuria and flank pain.   Musculoskeletal: Negative for myalgias, back pain, joint pain and falls.  Skin: Negative for itching and rash.  Neurological: Negative for dizziness, tingling, tremors, sensory change, speech change, focal weakness, seizures, loss of consciousness, weakness and headaches.  Endo/Heme/Allergies: Negative for environmental allergies and polydipsia. Does not bruise/bleed easily.  Psychiatric/Behavioral: Negative for depression, suicidal ideas, hallucinations, memory loss and substance abuse. The patient is not nervous/anxious and does not have insomnia.        Objective:  Blood pressure 113/81, pulse 76, height 5\' 8"  (1.727 m), weight 216 lb (98 kg), last menstrual period 02/23/2021.   Physical Exam  Vitals reviewed. Constitutional: She is oriented to person, place, and time. She appears well-developed and well-nourished.  HENT:  Head: Normocephalic and atraumatic.        Right Ear: External ear normal.  Left Ear: External ear normal.  Nose: Nose normal.  Mouth/Throat: Oropharynx is clear and moist.  Eyes: Conjunctivae and EOM are normal. Pupils are equal, round, and reactive to light. Right eye exhibits no discharge. Left eye exhibits no discharge. No scleral icterus.  Neck: Normal range of motion. Neck supple. No tracheal deviation present. No thyromegaly present.  Cardiovascular: Normal rate, regular rhythm, normal heart sounds and intact distal pulses.  Exam reveals no gallop and no friction rub.   No murmur heard. Respiratory: Effort normal and breath sounds normal. No respiratory distress. She has no wheezes. She has no rales. She exhibits no tenderness.  GI: Soft. Bowel sounds are normal. She exhibits no distension and no mass. There is no tenderness. There is no rebound and no guarding.  Genitourinary:  Breasts no masses skin changes or nipple changes bilaterally      Vulva is normal without lesions Vagina is pink moist without discharge Cervix normal in appearance and pap is  done Uterus is normal size shape and contour Adnexa is negative with normal sized ovaries   Musculoskeletal: Normal range of motion. She exhibits no edema and no tenderness.  Neurological: She is alert and oriented to person, place, and time. She has normal reflexes. She displays normal reflexes. No cranial nerve deficit. She exhibits normal muscle tone. Coordination normal.  Skin: Skin is warm and dry. No rash noted. No erythema. No pallor.  Psychiatric: She has a normal mood and affect. Her behavior is normal. Judgment and thought content normal.       Medications Ordered at today's visit: No orders of the defined types were placed in this encounter.   Other orders placed at today's visit: No orders of the defined types were placed in this encounter.     Assessment:    Normal Gyn exam.    Plan:    Contraception: condoms. Follow up in: 3-5 years.  Return for 3-5 years follow up based on HPV based cytology.

## 2021-03-14 LAB — CYTOLOGY - PAP
Chlamydia: NEGATIVE
Comment: NEGATIVE
Comment: NEGATIVE
Comment: NORMAL
Diagnosis: NEGATIVE
Diagnosis: REACTIVE
High risk HPV: NEGATIVE
Neisseria Gonorrhea: NEGATIVE

## 2021-04-04 ENCOUNTER — Telehealth: Payer: Medicaid Other | Admitting: Physician Assistant

## 2021-04-04 DIAGNOSIS — B9689 Other specified bacterial agents as the cause of diseases classified elsewhere: Secondary | ICD-10-CM

## 2021-04-04 DIAGNOSIS — N760 Acute vaginitis: Secondary | ICD-10-CM

## 2021-04-04 MED ORDER — METRONIDAZOLE 500 MG PO TABS: 500.0000 mg | ORAL_TABLET | Freq: Two times a day (BID) | ORAL | 0 refills | Status: DC

## 2021-04-04 NOTE — Progress Notes (Signed)

## 2021-04-04 NOTE — Progress Notes (Signed)
I have spent 5 minutes in review of e-visit questionnaire, review and updating patient chart, medical decision making and response to patient.   Mikel Hardgrove Cody Shahla Betsill, PA-C    

## 2021-04-18 ENCOUNTER — Other Ambulatory Visit: Payer: Self-pay

## 2021-04-18 ENCOUNTER — Telehealth: Payer: Medicaid Other | Admitting: Emergency Medicine

## 2021-04-18 ENCOUNTER — Telehealth: Payer: Self-pay | Admitting: Women's Health

## 2021-04-18 DIAGNOSIS — B009 Herpesviral infection, unspecified: Secondary | ICD-10-CM

## 2021-04-18 MED ORDER — ACYCLOVIR 400 MG PO TABS: 400.0000 mg | ORAL_TABLET | Freq: Every day | ORAL | 0 refills | Status: DC

## 2021-04-18 NOTE — Progress Notes (Signed)
E-Visit for Herpes Simplex  We are sorry that you are not feeling well.  Here is how we plan to help!  Based on what you have shared ith me, it looks like you may be having an outbreak/flare-up of genital herpes.    I have prescribed I have prescribed Acyclovir 400 mg Take on by mouth three times daily for 5 days.    If you have been prescribed long term medications to be taken on a regular basis, it is important to follow the recommendations and take them as ordered.    Outbreaks usually include blisters and open sores in the genital area. Outbreaks that happen after the first time are usually not as severe and do not last as long. Genital Herpes Simplex is a commonly sexually transmitted viral infection that is found worldwide. Most of these genital infections are caused by one or two herpes simplex viruses that is passed from person to person during vaginal, oral, or anal sex. Sometimes, people do not know they have herpes because they do not have any symptoms.  Please be aware that if you have genital herpes you can be contagious even when you are not having rash or flare-up and you may not have any symptoms, even when you are taking suppressive medicines.  Herpes cannot be cured. The disease usually causes most problems during the first few years. After that, the virus is still there, but it causes few to no symptoms. Even when the virus is active, people with herpes can take medicines to reduce and help prevent symptoms.  Herpes is an infection that can cause blisters and open sores on the genital area. Herpes is caused by a virus that is passed from person to person during vaginal, oral, or anal sex. Sometimes, people do not know they have herpes because they do not have any symptoms. Herpes cannot be cured. The disease usually causes most problems during the first few years. After that, the virus is still there, but it causes few to no symptoms. Even when the virus is active, people with herpes  can take medicines to reduce and help prevent symptoms.  If you have been prescribed medications to be taken on a regular basis, it is important to follow the recommendations and take them as ordered.  Some people with herpes never have any symptoms. But other people can develop symptoms within a few weeks of being infected with the herpes virus   Symptoms usually include blisters in the genital area. In women, this area includes the vagina, buttocks, anus, or thighs. In men, this area includes the penis, scrotum, anus, butt, or thighs. The blisters can become painful open sores, which then crust over as they heal. Sometimes, people can have other symptoms that include:  ?Blisters on the mouth or lips ?Fever, headache, or pain in the joints ?Trouble urinating  Outbreaks might occur every month or more often, or just once or twice a year. Sometimes, people can tell when an outbreak will occur, because they feel itching or pain beforehand. Sometimes they do not know that an outbreak is coming because they have no symptoms. Whatever your pattern is, keep in mind that herpes outbreaks usually become less frequent over time as you get older. Certain things, called "triggers," can make outbreaks more likely to occur. These include stress, sunlight, menstrual periods,or getting sick.  Antiviral therapy can shorten the duration of symptoms and signs in primary infection, which, when untreated, can be associated with significant increase in the   symptoms of the disease.  HOME CARE Use a portable bath (such as a "Sitz bath") where you can sit in warm water for about 20 minutes. Your bathtub could also work. Avoid bubble baths.  Keep the genital area clean and dry and avoid tight clothes.  Take over-the-counter pain medicine such as acetaminophen (brand name: Tylenol) or ibuprofen sample brand names: Advil, Motrin). But avoid aspirin.  Only take medications as instructed by your medical team.  You are  most likely to spread herpes to a sex partner when you have blisters and open sores on your body. But it's also possible to spread herpes to your partner when you do not have any symptoms. That is because herpes can be present on your body without causing any symptoms, like blisters or pain.  Telling your sex partner that you have herpes can be hard. But it can help protect them, since there are ways to lower the risk of spreading the infection.   Using a condom every time you have sex  Not having sex when you have symptoms  Not having oral sex if you have blisters or open sores (in the genital area or around your mouth)  MAKE SURE YOU   Understand these instructions. Do not have sex without using a condom until you have been seen by a doctor and as instructed by the provider If you are not better or improved within 7 days, you MUST have a follow up at your doctor or the health department for evaluation. There are other causes of rashes in the genital region.  Thank you for choosing an e-visit.  Your e-visit answers were reviewed by a board certified advanced clinical practitioner to complete your personal care plan. Depending upon the condition, your plan could have included both over the counter or prescription medications.  Please review your pharmacy choice. Make sure the pharmacy is open so you can pick up prescription now. If there is a problem, you may contact your provider through MyChart messaging and have the prescription routed to another pharmacy.  Your safety is important to us. If you have drug allergies check your prescription carefully.   For the next 24 hours you can use MyChart to ask questions about today's visit, request a non-urgent call back, or ask for a work or school excuse. You will get an email in the next two days asking about your experience. I hope that your e-visit has been valuable and will speed your recovery.     Approximately 5 minutes was used in  reviewing the patient's chart, questionnaire, prescribing medications, and documentation.  

## 2021-04-18 NOTE — Telephone Encounter (Signed)
Patient says she is having an outbreak again and was wanting to see if acyclovir could be called in for her.

## 2021-05-10 ENCOUNTER — Telehealth: Payer: Medicaid Other | Admitting: Physician Assistant

## 2021-05-10 DIAGNOSIS — B9689 Other specified bacterial agents as the cause of diseases classified elsewhere: Secondary | ICD-10-CM

## 2021-05-10 DIAGNOSIS — N760 Acute vaginitis: Secondary | ICD-10-CM

## 2021-05-10 MED ORDER — METRONIDAZOLE 500 MG PO TABS: 500.0000 mg | ORAL_TABLET | Freq: Two times a day (BID) | ORAL | 0 refills | Status: AC

## 2021-05-10 NOTE — Progress Notes (Signed)

## 2021-05-23 ENCOUNTER — Telehealth: Payer: Medicaid Other | Admitting: Physician Assistant

## 2021-05-23 DIAGNOSIS — B001 Herpesviral vesicular dermatitis: Secondary | ICD-10-CM | POA: Diagnosis not present

## 2021-05-23 DIAGNOSIS — Z3490 Encounter for supervision of normal pregnancy, unspecified, unspecified trimester: Secondary | ICD-10-CM | POA: Diagnosis not present

## 2021-05-23 MED ORDER — ACYCLOVIR 400 MG PO TABS: 400.0000 mg | ORAL_TABLET | Freq: Three times a day (TID) | ORAL | 0 refills | Status: AC

## 2021-05-23 NOTE — Progress Notes (Signed)
I have spent 5 minutes in review of e-visit questionnaire, review and updating patient chart, medical decision making and response to patient.   Shynice Sigel Cody Sanita Estrada, PA-C    

## 2021-05-23 NOTE — Progress Notes (Signed)
We are sorry that you are not feeling well.  Here is how we plan to help!  Based on what you have shared with me it does look like you have a viral infection.    Most cold sores or fever blisters are small fluid filled blisters around the mouth caused by herpes simplex virus.  The most common strain of the virus causing cold sores is herpes simplex virus 1.  It can be spread by skin contact, sharing eating utensils, or even sharing towels.  Cold sores are contagious to other people until dry. (Approximately 5-7 days).  Wash your hands. You can spread the virus to your eyes through handling your contact lenses after touching the lesions.  Most people experience pain at the sight or tingling sensations in their lips that may begin before the ulcers erupt.  Herpes simplex is treatable but not curable.  It may lie dormant for a long time and then reappear due to stress or prolonged sun exposure.  Many patients have success in treating their cold sores with an over the counter topical called Abreva.  You may apply the cream up to 5 times daily (maximum 10 days) until healing occurs.  If you would like to use an oral antiviral medication to speed the healing of your cold sore, I have sent a prescription to your local pharmacy; Giving mention of pregnancy I have adjusted the dose -- You will take Acyclovir 400 mg three times daily for 5 days.   HOME CARE:  Wash your hands frequently. Do not pick at or rub the sore. Don't open the blisters. Avoid kissing other people during this time. Avoid sharing drinking glasses, eating utensils, or razors. Do not handle contact lenses unless you have thoroughly washed your hands with soap and warm water! Avoid oral sex during this time.  Herpes from sores on your mouth can spread to your partner's genital area. Avoid contact with anyone who has eczema or a weakened immune system. Cold sores are often triggered by exposure to intense sunlight, use a lip balm  containing a sunscreen (SPF 30 or higher).  GET HELP RIGHT AWAY IF:  Blisters look infected. Blisters occur near or in the eye. Symptoms last longer than 10 days. Your symptoms become worse.  MAKE SURE YOU:  Understand these instructions. Will watch your condition. Will get help right away if you are not doing well or get worse.    Your e-visit answers were reviewed by a board certified advanced clinical practitioner to complete your personal care plan.  Depending upon the condition, your plan could have  Included both over the counter or prescription medications.    Please review your pharmacy choice.  Be sure that the pharmacy you have chosen is open so that you can pick up your prescription now.  If there is a problem you can message your provider in MyChart to have the prescription routed to another pharmacy.    Your safety is important to [place].  If you have drug allergies check our prescription carefully.  For the next 24 hours you can use MyChart to ask questions about today's visit, request a non-urgent call back, or ask for a work or school excuse from your e-visit provider.  You will get an email in the next two days asking about your experience.  I hope that your e-visit has been valuable and will speed your recovery.

## 2021-06-11 ENCOUNTER — Other Ambulatory Visit: Payer: Medicaid Other

## 2021-07-21 ENCOUNTER — Telehealth: Payer: Medicaid Other | Admitting: Physician Assistant

## 2021-07-21 DIAGNOSIS — B001 Herpesviral vesicular dermatitis: Secondary | ICD-10-CM | POA: Diagnosis not present

## 2021-07-21 MED ORDER — VALACYCLOVIR HCL 1 G PO TABS: 2000.0000 mg | ORAL_TABLET | Freq: Two times a day (BID) | ORAL | 0 refills | Status: AC

## 2021-07-21 NOTE — Progress Notes (Signed)

## 2021-07-21 NOTE — Progress Notes (Signed)
I have spent 5 minutes in review of e-visit questionnaire, review and updating patient chart, medical decision making and response to patient.   Demetri Kerman Cody Tasharra Nodine, PA-C    

## 2021-07-24 ENCOUNTER — Telehealth: Payer: Medicaid Other | Admitting: Family Medicine

## 2021-07-24 DIAGNOSIS — N898 Other specified noninflammatory disorders of vagina: Secondary | ICD-10-CM

## 2021-07-25 ENCOUNTER — Telehealth: Payer: Medicaid Other | Admitting: Physician Assistant

## 2021-07-25 DIAGNOSIS — B9689 Other specified bacterial agents as the cause of diseases classified elsewhere: Secondary | ICD-10-CM

## 2021-07-25 MED ORDER — METRONIDAZOLE 500 MG PO TABS
500.0000 mg | ORAL_TABLET | Freq: Two times a day (BID) | ORAL | 0 refills | Status: DC
Start: 2021-07-25 — End: 2022-07-16

## 2021-07-25 NOTE — Progress Notes (Signed)
E-Visit for Vaginal Symptoms  We are sorry that you are not feeling well. Here is how we plan to help! Based on what you shared with me it looks like you: May have a vaginosis due to bacteria  Vaginosis is an inflammation of the vagina that can result in discharge, itching and pain. The cause is usually a change in the normal balance of vaginal bacteria or an infection. Vaginosis can also result from reduced estrogen levels after menopause.  The most common causes of vaginosis are:   Bacterial vaginosis which results from an overgrowth of one on several organisms that are normally present in your vagina.   Yeast infections which are caused by a naturally occurring fungus called candida.   Vaginal atrophy (atrophic vaginosis) which results from the thinning of the vagina from reduced estrogen levels after menopause.   Trichomoniasis which is caused by a parasite and is commonly transmitted by sexual intercourse.  Factors that increase your risk of developing vaginosis include: Medications, such as antibiotics and steroids Uncontrolled diabetes Use of hygiene products such as bubble bath, vaginal spray or vaginal deodorant Douching Wearing damp or tight-fitting clothing Using an intrauterine device (IUD) for birth control Hormonal changes, such as those associated with pregnancy, birth control pills or menopause Sexual activity Having a sexually transmitted infection  Your treatment plan is Metronidazole or Flagyl 500mg  twice a day for 7 days.  I have electronically sent this prescription into the pharmacy that you have chosen. I could see where you were incorrectly marked as pregnant when you noted in both e-visit submission you were not pregnant. Our apologies!  Be sure to take all of the medication as directed. Stop taking any medication if you develop a rash, tongue swelling or shortness of breath. Mothers who are breast feeding should consider pumping and discarding their breast milk  while on these antibiotics. However, there is no consensus that infant exposure at these doses would be harmful.  Remember that medication creams can weaken latex condoms.   HOME CARE:  Good hygiene may prevent some types of vaginosis from recurring and may relieve some symptoms:  Avoid baths, hot tubs and whirlpool spas. Rinse soap from your outer genital area after a shower, and dry the area well to prevent irritation. Don't use scented or harsh soaps, such as those with deodorant or antibacterial action. Avoid irritants. These include scented tampons and pads. Wipe from front to back after using the toilet. Doing so avoids spreading fecal bacteria to your vagina.  Other things that may help prevent vaginosis include:  Don't douche. Your vagina doesn't require cleansing other than normal bathing. Repetitive douching disrupts the normal organisms that reside in the vagina and can actually increase your risk of vaginal infection. Douching won't clear up a vaginal infection. Use a latex condom. Both female and female latex condoms may help you avoid infections spread by sexual contact. Wear cotton underwear. Also wear pantyhose with a cotton crotch. If you feel comfortable without it, skip wearing underwear to bed. Yeast thrives in [place] Your symptoms should improve in the next day or two.  GET HELP RIGHT AWAY IF:  You have pain in your lower abdomen ( pelvic area or over your ovaries) You develop nausea or vomiting You develop a fever Your discharge changes or worsens You have persistent pain with intercourse You develop shortness of breath, a rapid pulse, or you faint.  These symptoms could be signs of problems or infections that need to be  evaluated by a medical provider now.  MAKE SURE YOU   Understand these instructions. Will watch your condition. Will get help right away if you are not doing well or get worse.  Thank you for choosing an e-visit.  Your e-visit  answers were reviewed by a board certified advanced clinical practitioner to complete your personal care plan. Depending upon the condition, your plan could have included both over the counter or prescription medications.  Please review your pharmacy choice. Make sure the pharmacy is open so you can pick up prescription now. If there is a problem, you may contact your provider through [employer] and have the prescription routed to another pharmacy.  Your safety is important to [place]. If you have drug allergies check your prescription carefully.   For the next 24 hours you can use MyChart to ask questions about today's visit, request a non-urgent call back, or ask for a work or school excuse. You will get an email in the next two days asking about your experience. I hope that your e-visit has been valuable and will speed your recovery.

## 2021-07-25 NOTE — Progress Notes (Signed)
[place]  Has vaginal discharge in pregnancy- should be seen in person  Message sent

## 2021-07-25 NOTE — Progress Notes (Signed)
I have spent 5 minutes in review of e-visit questionnaire, review and updating patient chart, medical decision making and response to patient.   Jermya Dowding Cody Reem Fleury, PA-C    

## 2021-08-04 ENCOUNTER — Encounter (INDEPENDENT_AMBULATORY_CARE_PROVIDER_SITE_OTHER): Payer: Self-pay | Admitting: Emergency Medicine

## 2021-08-04 ENCOUNTER — Telehealth: Payer: Self-pay | Admitting: Emergency Medicine

## 2021-08-04 DIAGNOSIS — B3731 Acute candidiasis of vulva and vagina: Secondary | ICD-10-CM

## 2021-08-04 MED ORDER — FLUCONAZOLE 150 MG PO TABS: 150.0000 mg | ORAL_TABLET | Freq: Once | ORAL | 0 refills | Status: AC

## 2021-08-04 NOTE — Progress Notes (Signed)
I have spent 5 minutes in review of e-visit questionnaire, review and updating patient chart, medical decision making and response to patient.   Zelina Jimerson, PA-C    

## 2021-08-04 NOTE — Progress Notes (Signed)

## 2021-08-06 ENCOUNTER — Telehealth: Payer: Medicaid Other | Admitting: Family Medicine

## 2021-08-06 DIAGNOSIS — B009 Herpesviral infection, unspecified: Secondary | ICD-10-CM

## 2021-08-06 NOTE — Progress Notes (Signed)
[place]  Seen several times in the last 2 weeks similar symptoms needs in person follow up.   Message sent to patient.

## 2021-08-09 ENCOUNTER — Telehealth: Payer: Medicaid Other | Admitting: Physician Assistant

## 2021-08-09 DIAGNOSIS — N898 Other specified noninflammatory disorders of vagina: Secondary | ICD-10-CM

## 2021-08-09 NOTE — Progress Notes (Signed)
Based on what you shared with me, I feel your condition warrants further evaluation and I recommend that you be seen in a face to face visit.   NOTE: There will be NO CHARGE for this eVisit   If you are having a true medical emergency please call 911.      For an urgent face to face visit, [facility] has six urgent care centers for your convenience:     [facility] [place] Get Driving Directions [phone] [address] [place], [place] [address]    [facility] ([place]) Get Driving Directions [phone] [address] [place], [place] [address]  [facility] ([place]) Get Driving Directions [phone] [address] [place],  [place]  [address]  [facility] Get Driving Directions [phone] [address] [place], [place] [address]   [facility] [place] Get Driving Directions  [phone] [address] [place], [place] [address]   [facility] [place] Get Driving Directions [phone] [address] , [place] [address]  Your MyChart E-visit questionnaire answers were reviewed by a board certified advanced clinical practitioner to complete your personal care plan based on your specific symptoms.  Thank you for using e-Visits.   I provided 5 minutes of non face-to-face time during this encounter for chart review and documentation.   

## 2021-09-04 ENCOUNTER — Telehealth: Payer: Medicaid Other | Admitting: Physician Assistant

## 2021-09-04 DIAGNOSIS — N760 Acute vaginitis: Secondary | ICD-10-CM

## 2021-09-04 NOTE — Progress Notes (Signed)
Patient is out of town 

## 2021-09-12 ENCOUNTER — Telehealth: Payer: Medicaid Other | Admitting: Family Medicine

## 2021-09-12 DIAGNOSIS — B001 Herpesviral vesicular dermatitis: Secondary | ICD-10-CM

## 2021-09-12 MED ORDER — VALACYCLOVIR HCL 1 G PO TABS: 2000.0000 mg | ORAL_TABLET | Freq: Two times a day (BID) | ORAL | 0 refills | Status: AC

## 2021-09-12 NOTE — Progress Notes (Signed)

## 2021-12-12 LAB — OB RESULTS CONSOLE ABO/RH: RH Type: POSITIVE

## 2021-12-12 LAB — HEPATITIS C ANTIBODY: HCV Ab: NEGATIVE

## 2021-12-12 LAB — OB RESULTS CONSOLE HEPATITIS B SURFACE ANTIGEN: Hepatitis B Surface Ag: NEGATIVE

## 2021-12-12 LAB — OB RESULTS CONSOLE ANTIBODY SCREEN: Antibody Screen: NEGATIVE

## 2021-12-12 LAB — OB RESULTS CONSOLE RUBELLA ANTIBODY, IGM: Rubella: NON-IMMUNE/NOT IMMUNE

## 2021-12-12 LAB — OB RESULTS CONSOLE HIV ANTIBODY (ROUTINE TESTING): HIV: NONREACTIVE

## 2021-12-12 LAB — OB RESULTS CONSOLE RPR: RPR: NONREACTIVE

## 2022-02-06 LAB — OB RESULTS CONSOLE GC/CHLAMYDIA
Chlamydia: NEGATIVE
Neisseria Gonorrhea: NEGATIVE

## 2022-02-06 LAB — CYTOLOGY - PAP

## 2022-06-26 LAB — OB RESULTS CONSOLE RPR: RPR: NONREACTIVE

## 2022-07-07 ENCOUNTER — Encounter (HOSPITAL_COMMUNITY): Payer: Self-pay

## 2022-07-07 ENCOUNTER — Inpatient Hospital Stay (HOSPITAL_COMMUNITY)
Admission: AD | Admit: 2022-07-07 | Discharge: 2022-07-07 | Disposition: A | Discharge status: Home / Self Care | Payer: Medicaid - Out of State | Attending: Obstetrics & Gynecology | Admitting: Obstetrics & Gynecology

## 2022-07-07 DIAGNOSIS — B9689 Other specified bacterial agents as the cause of diseases classified elsewhere: Secondary | ICD-10-CM | POA: Diagnosis not present

## 2022-07-07 DIAGNOSIS — O23593 Infection of other part of genital tract in pregnancy, third trimester: Secondary | ICD-10-CM | POA: Diagnosis not present

## 2022-07-07 DIAGNOSIS — Z3689 Encounter for other specified antenatal screening: Secondary | ICD-10-CM | POA: Diagnosis not present

## 2022-07-07 DIAGNOSIS — O26853 Spotting complicating pregnancy, third trimester: Secondary | ICD-10-CM | POA: Insufficient documentation

## 2022-07-07 DIAGNOSIS — O98813 Other maternal infectious and parasitic diseases complicating pregnancy, third trimester: Secondary | ICD-10-CM | POA: Diagnosis not present

## 2022-07-07 DIAGNOSIS — O471 False labor at or after 37 completed weeks of gestation: Secondary | ICD-10-CM | POA: Diagnosis not present

## 2022-07-07 DIAGNOSIS — O368130 Decreased fetal movements, third trimester, not applicable or unspecified: Secondary | ICD-10-CM | POA: Diagnosis present

## 2022-07-07 DIAGNOSIS — Z3A37 37 weeks gestation of pregnancy: Secondary | ICD-10-CM | POA: Diagnosis not present

## 2022-07-07 DIAGNOSIS — B3731 Acute candidiasis of vulva and vagina: Secondary | ICD-10-CM | POA: Insufficient documentation

## 2022-07-07 LAB — GC/CHLAMYDIA PROBE AMP (~~LOC~~) NOT AT ARMC
Chlamydia: NEGATIVE
Comment: NEGATIVE
Comment: NORMAL
Neisseria Gonorrhea: NEGATIVE

## 2022-07-07 LAB — WET PREP, GENITAL
Clue Cells Wet Prep HPF POC: NONE SEEN
Sperm: NONE SEEN
Trich, Wet Prep: NONE SEEN
WBC, Wet Prep HPF POC: >=10 — AB (ref <10)

## 2022-07-07 MED ORDER — FLUCONAZOLE 150 MG PO TABS: 150.0000 mg | ORAL_TABLET | Freq: Once | ORAL | 0 refills | Status: AC

## 2022-07-07 NOTE — MAU Note (Signed)
.  Courtney Zhang is a 27 y.o. at [facility] here in MAU reporting: here via EMS - c/o ctx throughout the day yesterday, but no pain currently. Woke up to go to the bathroom at 0040 noticed blood when she was wiping states "like period blood" - not flowing, just when she wipes. Denies LOF. Reports last felt baby move around 2100 when she went to bed - has not felt baby move since waking up.  EDD: 07/26/2022 Onset of complaint: 0040 Pain score: 0 Vitals:   07/07/22 0142  BP: 108/78  Pulse: 99  Resp: 19  Temp: 99.2 F (37.3 C)  SpO2: 100%     FHT:EFM placed in room - 130s Lab orders placed from triage:  UA

## 2022-07-07 NOTE — MAU Provider Note (Signed)
History   [id number]   Chief Complaint  Patient presents with   Decreased Fetal Movement   Vaginal Bleeding    HPI Courtney Zhang is a 27 y.o. female  [id number] @37 .2 wks here with report of VB.  Reports waking around 0040 and saw blood in her underwear. She went to the BR and saw blood on the toilet paper. Denies LOF. Denies vaginal itching. Pt reports rare contractions. She denies vaginal bleeding. She reports decreased fetal movement since waking up. All other systems negative.    No LMP recorded. Patient is pregnant.  OB History  Gravida Para Term Preterm AB Living  5 2 2   2 2   SAB IAB Ectopic Multiple Live Births  1 1   0 2    # Outcome Date GA Lbr Len/2nd Weight Sex Delivery Anes PTL Lv  5 Current           4 IAB 12/04/17          3 Term 03/21/17 [redacted]w[redacted]d 18:14 / 00:23 3000 g F Vag-Spont EPI  LIV  2 Term 03/31/15 [redacted]w[redacted]d  3380 g M CS-LTranv EPI N LIV     Birth Comments: GBS+     Complications: Failure to Progress in First Stage  1 SAB 2013 [redacted]w[redacted]d       FD    Past Medical History:  Diagnosis Date   Abdominal cramping 07/14/2014   Amenorrhea 07/14/2014   Chlamydia    Contraceptive education 11/07/2013   GERD (gastroesophageal reflux disease)    Herpes genitalia    PCO (polycystic ovaries) 08/02/2014   Pelvic pain in female 07/14/2014   Positive pregnancy test 08/02/2014    Family History  Problem Relation Age of Onset   Asthma Brother    Diabetes Maternal Grandmother    Hypertension Maternal Grandmother    Diabetes Maternal Grandfather    Diabetes Paternal Grandmother    Asthma Brother    Asthma Brother    Asthma Brother     Social History   Socioeconomic History   Marital status: Single    Spouse name: Not on file   Number of children: Not on file   Years of education: Not on file   Highest education level: Not on file  Occupational History   Not on file  Tobacco Use   Smoking status: Never   Smokeless tobacco: Never  Vaping Use   Vaping Use: Former   Substance and Sexual Activity   Alcohol use: Not Currently   Drug use: Yes    Frequency: 7.0 times per week    Types: Marijuana    Comment: last used 1/28 "2-3 times a day"   Sexual activity: Yes    Birth control/protection: None, Condom  Other Topics Concern   Not on file  Social History Narrative   Not on file   Social Determinants of Health   Financial Resource Strain: Medium Risk (03/11/2021)   Overall Financial Resource Strain (CARDIA)    Difficulty of Paying Living Expenses: Somewhat hard  Food Insecurity: No Food Insecurity (03/11/2021)   Hunger Vital Sign    Worried About Running Out of Food in the Last Year: Never true    Ran Out of Food in the Last Year: Never true  Transportation Needs: No Transportation Needs (03/11/2021)   PRAPARE - Hydrologist (Medical): No    Lack of Transportation (Non-Medical): No  Physical Activity: Insufficiently Active (03/11/2021)   Exercise Vital Sign  Days of Exercise per Week: 2 days    Minutes of Exercise per Session: 30 min  Stress: No Stress Concern Present (03/11/2021)   [employer] of Occupational Health - Occupational Stress Questionnaire    Feeling of Stress : Not at all  Social Connections: Socially Isolated (03/11/2021)   Social Connection and Isolation Panel [NHANES]    Frequency of Communication with Friends and Family: Three times a week    Frequency of Social Gatherings with Friends and Family: Twice a week    Attends Religious Services: Never    Database administrator or Organizations: No    Attends Banker Meetings: Never    Marital Status: Never married    No Known Allergies  No current facility-administered medications on file prior to encounter.   Current Outpatient Medications on File Prior to Encounter  Medication Sig Dispense Refill   metroNIDAZOLE (FLAGYL) 500 MG tablet Take 1 tablet (500 mg total) by mouth 2 (two) times daily. 14 tablet 0   valACYclovir  (VALTREX) 500 MG tablet Take 500 mg by mouth 2 (two) times daily.       Review of Systems  Gastrointestinal:  Negative for abdominal pain.  Genitourinary:  Positive for vaginal bleeding and vaginal discharge.     Physical Exam   Vitals:   07/07/22 0142  BP: 108/78  Pulse: 99  Resp: 19  Temp: 99.2 F (37.3 C)  SpO2: 100%  Weight: 107.5 kg  Height: 5\' 8"  (1.727 m)    Physical Exam Vitals and nursing note reviewed. Exam conducted with a chaperone present.  Constitutional:      General: She is not in acute distress.    Appearance: Normal appearance.  HENT:     Head: Normocephalic and atraumatic.  Cardiovascular:     Rate and Rhythm: Normal rate.  Pulmonary:     Effort: Pulmonary effort is normal. No respiratory distress.  Abdominal:     Palpations: Abdomen is soft.     Tenderness: There is no abdominal tenderness.  Genitourinary:    Comments: SSE: copious curdy yellow discharge, no blood SVE: 1/thick Musculoskeletal:        General: Normal range of motion.     Cervical back: Normal range of motion.  Skin:    General: Skin is warm and dry.  Neurological:     General: No focal deficit present.     Mental Status: She is alert and oriented to person, place, and time.  Psychiatric:        Mood and Affect: Mood normal.        Behavior: Behavior normal.   EFM: 125 bpm, mod variability, + accels, no decels Toco: rare  Results for orders placed or performed during the hospital encounter of 07/07/22 (from the past 24 hour(s))  Wet prep, genital     Status: Abnormal   Collection Time: 07/07/22  2:12 AM  Result Value Ref Range   Yeast Wet Prep HPF POC PRESENT (A) NONE SEEN   Trich, Wet Prep NONE SEEN NONE SEEN   Clue Cells Wet Prep HPF POC NONE SEEN NONE SEEN   WBC, Wet Prep HPF POC >=10 (A) <10   Sperm NONE SEEN     MAU Course  Procedures  MDM No prenatal records on file for review. Pt reports uncomplicated pregnancy. Pt reports more FM now, NST reactive. Labs  ordered and reviewed. Will treat for yeast. Just prior to discharge pt reports more frequent ctx. Cervical exam done 1.5 hrs  after first and no change. Stable for discharge home.   Assessment and Plan   1. [redacted] weeks gestation of pregnancy   2. NST (non-stress test) reactive   3. Yeast vaginitis    Discharge home Follow up with primary OB this week Labor precautions FMCs Rx Diflucan  Allergies as of 07/07/2022   No Known Allergies      Medication List     TAKE these medications    fluconazole 150 MG tablet Commonly known as: DIFLUCAN Take 1 tablet (150 mg total) by mouth once for 1 dose.   metroNIDAZOLE 500 MG tablet Commonly known as: FLAGYL Take 1 tablet (500 mg total) by mouth 2 (two) times daily.   valACYclovir 500 MG tablet Commonly known as: VALTREX Take 500 mg by mouth 2 (two) times daily.        Julianne Handler, CNM 07/07/2022 2:15 AM

## 2022-07-09 ENCOUNTER — Inpatient Hospital Stay (HOSPITAL_COMMUNITY)
Admission: AD | Admit: 2022-07-09 | Discharge: 2022-07-10 | Disposition: A | Discharge status: Home / Self Care | Payer: Medicaid - Out of State | Attending: Obstetrics and Gynecology | Admitting: Obstetrics and Gynecology

## 2022-07-09 ENCOUNTER — Encounter (HOSPITAL_COMMUNITY): Payer: Self-pay | Admitting: Obstetrics and Gynecology

## 2022-07-09 DIAGNOSIS — O479 False labor, unspecified: Secondary | ICD-10-CM

## 2022-07-09 DIAGNOSIS — O471 False labor at or after 37 completed weeks of gestation: Secondary | ICD-10-CM | POA: Insufficient documentation

## 2022-07-09 DIAGNOSIS — Z3A37 37 weeks gestation of pregnancy: Secondary | ICD-10-CM | POA: Diagnosis not present

## 2022-07-09 NOTE — MAU Note (Signed)
..  Courtney Zhang is a 27 y.o. at [redacted]w[redacted]d here in MAU reporting: contractions since 8pm that are now 1 minute apart. Reports some bloody show. Denies leaking of fluid. +FM   Reports she is here visiting from [place] and gets prenatal care there.  On valtrex, last outbreak was last year.   Pain score: 8/10  WPV:[id number] in room 130's

## 2022-07-09 NOTE — Discharge Instructions (Signed)
Would advise you to follow up in [facility] for your routine OB care.

## 2022-07-09 NOTE — Progress Notes (Signed)
S: Ms. Courtney Zhang is a 27 y.o. [phone] at [redacted]w[redacted]d  who presents to MAU today complaining contractions q 1 minutes since 8PM. She denies vaginal bleeding. She denies LOF. She reports normal fetal movement.    O: BP 107/69 (BP Location: Left Arm)   Pulse (!) 103   Temp 97.7 F (36.5 C) (Oral)   Resp 20   Ht 5\' 8"  (1.727 m)   Wt 108.4 kg   SpO2 99%   BMI 36.34 kg/m   Cervical exam:  Dilation: Fingertip Exam by:: Huston Foley, RN  Fetal Monitoring: Baseline: 120 bpm Variability: Moderate Accelerations: +accels Decelerations: no decels Contractions: irritability   A: SIUP at [redacted]w[redacted]d  False labor  P: -Labor precautions given -Instructed to return to [facility] where she gets her OB care  Autry-Lott, Naaman Plummer, DO 07/09/2022 11:28 PM

## 2022-07-10 DIAGNOSIS — Z3A37 37 weeks gestation of pregnancy: Secondary | ICD-10-CM | POA: Diagnosis not present

## 2022-07-10 DIAGNOSIS — O471 False labor at or after 37 completed weeks of gestation: Secondary | ICD-10-CM | POA: Diagnosis present

## 2022-07-16 ENCOUNTER — Encounter: Payer: Self-pay | Admitting: Obstetrics and Gynecology

## 2022-07-16 ENCOUNTER — Ambulatory Visit (INDEPENDENT_AMBULATORY_CARE_PROVIDER_SITE_OTHER): Payer: Self-pay | Admitting: Obstetrics and Gynecology

## 2022-07-16 VITALS — BP 113/83 | HR 107 | Wt 234.2 lb

## 2022-07-16 DIAGNOSIS — Z98891 History of uterine scar from previous surgery: Secondary | ICD-10-CM

## 2022-07-16 DIAGNOSIS — Z3480 Encounter for supervision of other normal pregnancy, unspecified trimester: Secondary | ICD-10-CM

## 2022-07-16 DIAGNOSIS — Z3483 Encounter for supervision of other normal pregnancy, third trimester: Secondary | ICD-10-CM

## 2022-07-16 DIAGNOSIS — B009 Herpesviral infection, unspecified: Secondary | ICD-10-CM

## 2022-07-16 DIAGNOSIS — Z3490 Encounter for supervision of normal pregnancy, unspecified, unspecified trimester: Secondary | ICD-10-CM | POA: Insufficient documentation

## 2022-07-16 DIAGNOSIS — Z3A38 38 weeks gestation of pregnancy: Secondary | ICD-10-CM

## 2022-07-16 NOTE — Progress Notes (Signed)
Subjective:  Courtney Zhang is a 27 y.o. [id number] at [redacted]w[redacted]d being seen today for ongoing prenatal care.  She is currently monitored for the following issues for this low-risk pregnancy and has PCO (polycystic ovaries); Previous cesarean section; HSV-2 (herpes simplex virus 2) infection; History of vaginal delivery following previous cesarean delivery; and Encounter for supervision of normal pregnancy, antepartum on their problem list.  Patient reports  general discomforts of pregnancy .  Contractions: Irregular. Vag. Bleeding: None.  Movement: Present. Denies leaking of fluid.   The following portions of the patient's history were reviewed and updated as appropriate: allergies, current medications, past family history, past medical history, past social history, past surgical history and problem list. Problem list updated.  Objective:   Vitals:   07/16/22 1508  BP: 113/83  Pulse: (!) 107  Weight: 234 lb 3.2 oz (106.2 kg)    Fetal Status:     Movement: Present     General:  Alert, oriented and cooperative. Patient is in no acute distress.  Skin: Skin is warm and dry. No rash noted.   Cardiovascular: Normal heart rate noted  Respiratory: Normal respiratory effort, no problems with respiration noted  Abdomen: Soft, gravid, appropriate for gestational age. Pain/Pressure: Present     Pelvic:  preformed     Extremities: Normal range of motion.     Mental Status: Normal mood and affect. Normal behavior. Normal judgment and thought content.   Urinalysis:      Assessment and Plan:  Pregnancy: [id number] at [redacted]w[redacted]d  1. History of vaginal delivery following previous cesarean delivery For [facility]  2. Supervision of other normal pregnancy, antepartum Stable Labor precautions  3. HSV-2 (herpes simplex virus 2) infection Continue suppression  Term labor symptoms and general obstetric precautions including but not limited to vaginal bleeding, contractions, leaking of fluid and fetal movement were  reviewed in detail with the patient. Please refer to After Visit Summary for other counseling recommendations.  Return in about 1 week (around 07/23/2022) for OB visit, face to face, any provider.   Chancy Milroy, MD

## 2022-07-18 ENCOUNTER — Encounter (HOSPITAL_COMMUNITY): Payer: Self-pay | Admitting: Obstetrics and Gynecology

## 2022-07-18 ENCOUNTER — Inpatient Hospital Stay (HOSPITAL_COMMUNITY)
Admission: AD | Admit: 2022-07-18 | Discharge: 2022-07-18 | Disposition: A | Discharge status: Home / Self Care | Payer: Medicaid - Out of State | Attending: Obstetrics and Gynecology | Admitting: Obstetrics and Gynecology

## 2022-07-18 DIAGNOSIS — Z3689 Encounter for other specified antenatal screening: Secondary | ICD-10-CM

## 2022-07-18 DIAGNOSIS — O212 Late vomiting of pregnancy: Secondary | ICD-10-CM | POA: Insufficient documentation

## 2022-07-18 DIAGNOSIS — O479 False labor, unspecified: Secondary | ICD-10-CM

## 2022-07-18 DIAGNOSIS — O99613 Diseases of the digestive system complicating pregnancy, third trimester: Secondary | ICD-10-CM | POA: Diagnosis not present

## 2022-07-18 DIAGNOSIS — F1290 Cannabis use, unspecified, uncomplicated: Secondary | ICD-10-CM

## 2022-07-18 DIAGNOSIS — Z3A38 38 weeks gestation of pregnancy: Secondary | ICD-10-CM | POA: Insufficient documentation

## 2022-07-18 DIAGNOSIS — O99323 Drug use complicating pregnancy, third trimester: Secondary | ICD-10-CM | POA: Diagnosis not present

## 2022-07-18 DIAGNOSIS — O99283 Endocrine, nutritional and metabolic diseases complicating pregnancy, third trimester: Secondary | ICD-10-CM | POA: Insufficient documentation

## 2022-07-18 LAB — CBC
HCT: 38.5 % (ref 36.0–46.0)
Hemoglobin: 12.7 g/dL (ref 12.0–15.0)
MCH: 26.9 pg (ref 26.0–34.0)
MCHC: 33 g/dL (ref 30.0–36.0)
MCV: 81.6 fL (ref 80.0–100.0)
Platelets: 282 10*3/uL (ref 150–400)
RBC: 4.72 MIL/uL (ref 3.87–5.11)
RDW: 14.5 % (ref 11.5–15.5)
WBC: 9 10*3/uL (ref 4.0–10.5)
nRBC: 0 % (ref 0.0–0.2)

## 2022-07-18 LAB — URINALYSIS, MICROSCOPIC (REFLEX): RBC / HPF: NONE SEEN RBC/hpf (ref 0–5)

## 2022-07-18 LAB — URINALYSIS, ROUTINE W REFLEX MICROSCOPIC
Glucose, UA: NEGATIVE mg/dL
Hgb urine dipstick: NEGATIVE
Ketones, ur: >80 mg/dL — AB
Leukocytes,Ua: NEGATIVE
Nitrite: NEGATIVE
Protein, ur: 100 mg/dL — AB
Specific Gravity, Urine: 1.015 (ref 1.005–1.030)
pH: 7.5 (ref 5.0–8.0)

## 2022-07-18 MED ORDER — DIPHENHYDRAMINE HCL 50 MG/ML IJ SOLN
25.0000 mg | Freq: Once | INTRAMUSCULAR | Status: AC
  Administered 2022-07-18: 25 mg via INTRAVENOUS
  Filled 2022-07-18: qty 1

## 2022-07-18 MED ORDER — HALOPERIDOL LACTATE 5 MG/ML IJ SOLN
2.0000 mg | Freq: Once | INTRAMUSCULAR | Status: AC
  Administered 2022-07-18: 2 mg via INTRAVENOUS
  Filled 2022-07-18: qty 1

## 2022-07-18 NOTE — MAU Note (Signed)
C/o pain in upper abd from the vomiting, not contractions.  Pt has emesis bag, cool wet cloth given.  Discussed with CNM.

## 2022-07-18 NOTE — MAU Note (Signed)
Courtney Zhang is a 27 y.o. at 54w6dhere in MAU reporting: been having contractions since 0130, hasn't timed them- but they are pretty much coming back to back.  Has been throwing up the last couple of days.  Can't keep anything down, feels so weak. Been having diarrhea- started this morning, after the contractions started.  Denies bleeding or LOF, stomach is so sore she can't tell if the baby is moving.  Onset of complaint: 0130 Pain score: 8 Vitals:   07/18/22 0822  BP: 124/80  Pulse: (!) 113  Resp: 18  Temp: 97.9 F (36.6 C)  SpO2: 100%     FHT:145 Lab orders placed from triage:   Unable to get spec, pt had just gone while in lobby

## 2022-07-18 NOTE — Discharge Instructions (Addendum)
  Cervical Ripening (to get your cervix ready for labor) : May try one or all:  Red Raspberry Leaf capsules:  two 300mg or 400mg tablets with each meal, 2-3 times a day  Potential Side Effects Of Raspberry Leaf:  Most women do not experience any side effects from drinking raspberry leaf tea. However, nausea and loose stools are possible     Evening Primrose Oil capsules: may take 1 to 3 capsules daily. May also prick one to release the oil and insert it into your vagina at night.  Some of the potential side effects:  Upset stomach  Loose stools or diarrhea  Headaches  Nausea   6 Dates a day (may taste better if warmed in microwave until soft). Found where raisins are in the grocery store   The MilesCircuit This circuit takes at least 90 minutes to complete so clear your schedule and make mental preparations so you can relax in your environment.  The second step requires a lot of pillows so gather them up before beginning.  Before starting, you should empty your bladder! Have a nice drink nearby, and make sure it has a straw! If you are having contractions, this circuit should be done through contractions, try not to change positions between steps.  Step One: Open-kneeChest Stay in this position for 30 minutes, start in cat/cow, then drop your chest as low as you can to the bed or the floor and your bottom as high as you can. Knees should be fairly wide apart, and the angle between the torso/thighs should be wider than 90 degrees. Wiggle around, prop with lots of pillows and use this time to get totally relaxed. This position allows the baby to scoot out of the pelvis a bit and gives them room to rotate, shift their head position, etc. If the pregnant person finds it helpful, careful positioning with a rebozo under the belly, with gentle tension from a support person behind can help maintain this position for the full 30 minutes.  Step Two:ExaggeratedLeft  SideLying Roll to your left side, bringing your top leg as high as possible and keeping your bottom leg straight. Roll forward as much as possible, again using a lot of pillows. Sink into the bed and relax some more. If you fall asleep, that's totally okay and you can stay there! If not, stay here for at least another half an hour. Try and get your top right leg up towards your head and get as rolled over onto your belly as much as possible. If you repeat the circuit during labor, try alternating left and right sides. We know the photo the left is actually right side... just flip the image in your head.  Step Three: Moving and Lunges Lunge, walk stairs facing sideways, 2 at a time, (have a spotter downstairs of you!), take a walk outside with one foot on the curb and the other on the street, sit on a birth ball and hula- anything that's upright and putting your pelvis in open, asymmetrical positions. Spend at least 30 minutes doing this one as well to give your baby a chance to move down. If you are lunging or stair or curb walking, you should lunge/walk/go up stairs in the direction that feels better to you. The key with the lunge is that the toes of the higher leg and mom's belly button should be at right angles. Do not lunge over your knee, that closes the pelvis.  You got this!!!!!!!!!!!!!!!!!!!!!!! :)  

## 2022-07-18 NOTE — MAU Provider Note (Signed)
History    CSN: [id number]  Arrival date and time: 07/18/22 [id number]  Event Date/Time  First Provider Initiated Contact with Patient 07/18/22 [phone]     Chief Complaint  Patient presents with   Emesis   Contractions   HPI Courtney Zhang is a 27 y.o. [id number] at 76w6dwho presents to MAU with chief complaint of contractions. This is a new problem, onset around 0130 today. Patient reports that contractions are back-to back. Pain score is 8/10. She denies LOF.   Patient also reports recurrent vomiting and "feeling weak" in the setting of daily THC use.  She states she tried to stop use for one day and experienced severe cyclical vomiting. She denies fever, no one else in the home is ill. She verbally consents to treatment for Cannabis Hyperemesis. Patient states she is unable to assess fetal movement because her vomiting is so severe.  Ob hx significant for: primary cesarean (2016), VBAC (2018). [place] [facility]/20[facility].   OB History     Gravida  5   Para  2   Term  2   Preterm      AB  2   Living  2      SAB  1   IAB  1   Ectopic      Multiple  0   Live Births  2           Past Medical History:  Diagnosis Date   Abdominal cramping 07/14/2014   Amenorrhea 07/14/2014   Chlamydia    Contraceptive education 11/07/2013   GERD (gastroesophageal reflux disease)    Herpes genitalia    PCO (polycystic ovaries) 08/02/2014   Pelvic pain in female 07/14/2014   Positive pregnancy test 08/02/2014    Past Surgical History:  Procedure Laterality Date   CESAREAN SECTION     CHOLECYSTECTOMY  10/01/2016   DILATION AND CURETTAGE OF UTERUS  12/13/2011?   INDUCED ABORTION  12/05/2017    Family History  Problem Relation Age of Onset   Asthma Brother    Asthma Brother    Asthma Brother    Asthma Brother    Diabetes Maternal Grandmother    Hypertension Maternal Grandmother     Diabetes Maternal Grandfather    Diabetes Paternal Grandmother     Social History   Tobacco Use   Smoking status: Never   Smokeless tobacco: Never  Vaping Use   Vaping Use: Former  Substance Use Topics   Alcohol use: Not Currently   Drug use: Yes    Frequency: 7.0 times per week    Types: Marijuana    Comment: last used 07/08/2022  "2-3 times a day"    Allergies: No Known Allergies  Medications Prior to Admission  Medication Sig Dispense Refill Last Dose   valACYclovir (VALTREX) 500 MG tablet Take 500 mg by mouth 2 (two) times daily.   07/17/2022   calcium carbonate (TUMS EX) 750 MG chewable tablet Chew 1 tablet by mouth daily.      Prenatal Vit-Fe Fumarate-FA (PRENATAL VITAMIN PO) Take by mouth.       Review of Systems  Gastrointestinal:  Positive for abdominal pain and vomiting.  All other systems reviewed and are negative.  Physical Exam   Blood pressure 133/72, pulse 97, temperature 97.9 F (36.6 C), temperature source Oral, resp. rate 18, height 5' 8"$  (1.727 m), weight 104.7 kg, last menstrual period  10/19/2021, SpO2 100 %.  Physical Exam Vitals and nursing note reviewed. Exam conducted with a chaperone present.  Constitutional:      Appearance: Normal appearance. She is obese. She is not ill-appearing.  Cardiovascular:     Rate and Rhythm: Normal rate.     Pulses: Normal pulses.     Heart sounds: Normal heart sounds.  Pulmonary:     Effort: Pulmonary effort is normal.     Breath sounds: Normal breath sounds.  Abdominal:     Comments: Gravid  Skin:    Capillary Refill: Capillary refill takes less than 2 seconds.  Neurological:     Mental Status: She is alert and oriented to person, place, and time.  Psychiatric:        Mood and Affect: Mood normal.        Behavior: Behavior normal.        Thought Content: Thought content normal.        Judgment: Judgment normal.     MAU Course  Procedures  MDM --Reactive tracing: baseline 135, mod var, + accels, no  decels --Ctx q 2-5 min --Cervix remains 1/thick/long 90 min after original assessment --Prenatal records not visible in Media or Care Everywhere. Will order CBC for report of weakness  Orders Placed This Encounter  Procedures   Urinalysis, Routine w reflex microscopic -Urine, Clean Catch   CBC   Patient Vitals for the past 24 hrs:  BP Temp Temp src Pulse Resp SpO2 Height Weight  07/18/22 1014 116/68 -- -- 92 -- -- -- --  07/18/22 0835 133/72 -- -- 97 -- 100 % -- --  07/18/22 0822 124/80 97.9 F (36.6 C) Oral (!) 113 18 100 % 5' 8"$  (1.727 m) 104.7 kg   Results for orders placed or performed during the hospital encounter of 07/18/22 (from the past 24 hour(s))  Urinalysis, Routine w reflex microscopic -Urine, Clean Catch     Status: Abnormal   Collection Time: 07/18/22  8:28 AM  Result Value Ref Range   Color, Urine ORANGE (A) YELLOW   APPearance CLEAR CLEAR   Specific Gravity, Urine 1.015 1.005 - 1.030   pH 7.5 5.0 - 8.0   Glucose, UA NEGATIVE NEGATIVE mg/dL   Hgb urine dipstick NEGATIVE NEGATIVE   Bilirubin Urine SMALL (A) NEGATIVE   Ketones, ur >80 (A) NEGATIVE mg/dL   Protein, ur 100 (A) NEGATIVE mg/dL   Nitrite NEGATIVE NEGATIVE   Leukocytes,Ua NEGATIVE NEGATIVE  Urinalysis, Microscopic (reflex)     Status: Abnormal   Collection Time: 07/18/22  8:28 AM  Result Value Ref Range   RBC / HPF NONE SEEN 0 - 5 RBC/hpf   WBC, UA 0-5 0 - 5 WBC/hpf   Bacteria, UA FEW (A) NONE SEEN   Squamous Epithelial / HPF 0-5 0 - 5 /HPF   Mucus PRESENT   CBC     Status: None   Collection Time: 07/18/22  9:10 AM  Result Value Ref Range   WBC 9.0 4.0 - 10.5 K/uL   RBC 4.72 3.87 - 5.11 MIL/uL   Hemoglobin 12.7 12.0 - 15.0 g/dL   HCT 38.5 36.0 - 46.0 %   MCV 81.6 80.0 - 100.0 fL   MCH 26.9 26.0 - 34.0 pg   MCHC 33.0 30.0 - 36.0 g/dL   RDW 14.5 11.5 - 15.5 %   Platelets 282 150 - 400 K/uL   nRBC 0.0 0.0 - 0.2 %   Meds ordered this encounter  Medications   haloperidol  lactate (HALDOL)  injection 2 mg   diphenhydrAMINE (BENADRYL) injection 25 mg   diphenhydrAMINE (BENADRYL) injection 25 mg   Assessment and Plan  --27 y.o. [id number] at [redacted]w[redacted]d --Reactive tracing --Cervix remains 1/thick/long, unchanged from 07/06/2022 --Vomiting and ketonuria 2/2 Cannabis Hyperemesis --Tolerating PO prior to discharge --Discharge home in stable condition with labor precautions  F[facility]U: [facility]  SDarlina Rumpf [place] MSN, CNM 07/18/2022, 2:51 PM

## 2022-07-20 ENCOUNTER — Inpatient Hospital Stay (HOSPITAL_COMMUNITY)
Admission: AD | Admit: 2022-07-20 | Discharge: 2022-07-20 | Disposition: A | Discharge status: Home / Self Care | Payer: Medicaid - Out of State | Attending: Obstetrics & Gynecology | Admitting: Obstetrics & Gynecology

## 2022-07-20 ENCOUNTER — Encounter (HOSPITAL_COMMUNITY): Payer: Self-pay | Admitting: Obstetrics & Gynecology

## 2022-07-20 ENCOUNTER — Other Ambulatory Visit: Payer: Self-pay

## 2022-07-20 DIAGNOSIS — O212 Late vomiting of pregnancy: Secondary | ICD-10-CM | POA: Diagnosis present

## 2022-07-20 DIAGNOSIS — F12188 Cannabis abuse with other cannabis-induced disorder: Secondary | ICD-10-CM

## 2022-07-20 DIAGNOSIS — O471 False labor at or after 37 completed weeks of gestation: Secondary | ICD-10-CM | POA: Insufficient documentation

## 2022-07-20 DIAGNOSIS — Z3A39 39 weeks gestation of pregnancy: Secondary | ICD-10-CM

## 2022-07-20 DIAGNOSIS — O99283 Endocrine, nutritional and metabolic diseases complicating pregnancy, third trimester: Secondary | ICD-10-CM | POA: Diagnosis not present

## 2022-07-20 DIAGNOSIS — E876 Hypokalemia: Secondary | ICD-10-CM | POA: Diagnosis not present

## 2022-07-20 DIAGNOSIS — O99613 Diseases of the digestive system complicating pregnancy, third trimester: Secondary | ICD-10-CM | POA: Diagnosis not present

## 2022-07-20 DIAGNOSIS — O99323 Drug use complicating pregnancy, third trimester: Secondary | ICD-10-CM | POA: Insufficient documentation

## 2022-07-20 DIAGNOSIS — F1210 Cannabis abuse, uncomplicated: Secondary | ICD-10-CM | POA: Insufficient documentation

## 2022-07-20 DIAGNOSIS — O479 False labor, unspecified: Secondary | ICD-10-CM

## 2022-07-20 LAB — COMPREHENSIVE METABOLIC PANEL
ALT: 12 U/L (ref 0–44)
AST: 16 U/L (ref 15–41)
Albumin: 3.2 g/dL — ABNORMAL LOW (ref 3.5–5.0)
Alkaline Phosphatase: 182 U/L — ABNORMAL HIGH (ref 38–126)
Anion gap: 13 (ref 5–15)
BUN: 5 mg/dL — ABNORMAL LOW (ref 6–20)
CO2: 20 mmol/L — ABNORMAL LOW (ref 22–32)
Calcium: 9 mg/dL (ref 8.9–10.3)
Chloride: 102 mmol/L (ref 98–111)
Creatinine, Ser: 0.58 mg/dL (ref 0.44–1.00)
GFR, Estimated: >60 mL/min (ref >60)
Glucose, Bld: 77 mg/dL (ref 70–99)
Potassium: 3 mmol/L — ABNORMAL LOW (ref 3.5–5.1)
Sodium: 135 mmol/L (ref 135–145)
Total Bilirubin: 0.3 mg/dL (ref 0.3–1.2)
Total Protein: 7.1 g/dL (ref 6.5–8.1)

## 2022-07-20 LAB — URINALYSIS, ROUTINE W REFLEX MICROSCOPIC
Glucose, UA: NEGATIVE mg/dL
Hgb urine dipstick: NEGATIVE
Ketones, ur: 80 mg/dL — AB
Leukocytes,Ua: NEGATIVE
Nitrite: NEGATIVE
Protein, ur: 100 mg/dL — AB
Specific Gravity, Urine: 1.031 — ABNORMAL HIGH (ref 1.005–1.030)
pH: 5 (ref 5.0–8.0)

## 2022-07-20 MED ORDER — LACTATED RINGERS IV BOLUS
1000.0000 mL | Freq: Once | INTRAVENOUS | Status: AC
  Administered 2022-07-20: 1000 mL via INTRAVENOUS

## 2022-07-20 MED ORDER — ONDANSETRON HCL 8 MG PO TABS: 8.0000 mg | ORAL_TABLET | Freq: Three times a day (TID) | ORAL | 0 refills | Status: AC | PRN

## 2022-07-20 MED ORDER — SODIUM CHLORIDE 0.9 % IV SOLN
25.0000 mg | Freq: Once | INTRAVENOUS | Status: AC
  Administered 2022-07-20: 25 mg via INTRAVENOUS
  Filled 2022-07-20: qty 1

## 2022-07-20 MED ORDER — FAMOTIDINE IN NACL 20-0.9 MG/50ML-% IV SOLN
20.0000 mg | Freq: Once | INTRAVENOUS | Status: AC
  Administered 2022-07-20: 20 mg via INTRAVENOUS
  Filled 2022-07-20: qty 50

## 2022-07-20 MED ORDER — FENTANYL CITRATE (PF) 100 MCG/2ML IJ SOLN
100.0000 ug | Freq: Once | INTRAMUSCULAR | Status: AC
  Administered 2022-07-20: 100 ug via INTRAVENOUS
  Filled 2022-07-20: qty 2

## 2022-07-20 MED ORDER — SODIUM CHLORIDE 0.9 % IV SOLN
8.0000 mg | Freq: Once | INTRAVENOUS | Status: AC
  Administered 2022-07-20: 8 mg via INTRAVENOUS
  Filled 2022-07-20: qty 4

## 2022-07-20 MED ORDER — POTASSIUM CHLORIDE CRYS ER 20 MEQ PO TBCR: 20.0000 meq | EXTENDED_RELEASE_TABLET | Freq: Two times a day (BID) | ORAL | 0 refills | Status: AC

## 2022-07-20 MED ORDER — POTASSIUM CHLORIDE CRYS ER 20 MEQ PO TBCR
40.0000 meq | EXTENDED_RELEASE_TABLET | Freq: Once | ORAL | Status: DC
  Filled 2022-07-20: qty 2

## 2022-07-20 NOTE — MAU Note (Signed)
Courtney Zhang is a 27 y.o. at 77w1dhere in MAU reporting: ctx that started at 0300. Denies any LOF or VB. +FM   states she has been nauseated and vomiting for the last few days. Every time she eats she can't keep anything down and has body aches. Denies any fevers. States she smokes marijuana daily but took a break for a few days and when she smoked again yesterday it made her sickness worse.   Onset of complaint: 0300 Pain score: 8 Vitals:   07/20/22 0822  BP: 120/80  Pulse: (!) 107  Resp: 19  Temp: 97.8 F (36.6 C)     FHT:135

## 2022-07-20 NOTE — MAU Provider Note (Signed)
History     CSN: [id number]  Arrival date and time: 07/20/22 [id number]   Event Date/Time   First Provider Initiated Contact with Patient 07/20/22 0935      Chief Complaint  Patient presents with   Contractions   Emesis   Nausea   HPI  Ms.Courtney Zhang is a 27 y.o. female [phone] @ 3w1dhere in MAU with complaints of N/V and contractions. She has had this issue throughout her pregnancy. She has not taken anything for the symptoms. She is not able to keep anything down. She has no diarrhea. She does reports smoking THC.    OB History     Gravida  5   Para  2   Term  2   Preterm      AB  2   Living  2      SAB  1   IAB  1   Ectopic      Multiple  0   Live Births  2           Past Medical History:  Diagnosis Date   Abdominal cramping 07/14/2014   Amenorrhea 07/14/2014   Chlamydia    Contraceptive education 11/07/2013   GERD (gastroesophageal reflux disease)    Herpes genitalia    PCO (polycystic ovaries) 08/02/2014   Pelvic pain in female 07/14/2014   Positive pregnancy test 08/02/2014    Past Surgical History:  Procedure Laterality Date   CESAREAN SECTION     CHOLECYSTECTOMY  10/01/2016   DILATION AND CURETTAGE OF UTERUS  12/13/2011?   INDUCED ABORTION  12/05/2017    Family History  Problem Relation Age of Onset   Asthma Brother    Asthma Brother    Asthma Brother    Asthma Brother    Diabetes Maternal Grandmother    Hypertension Maternal Grandmother    Diabetes Maternal Grandfather    Diabetes Paternal Grandmother     Social History   Tobacco Use   Smoking status: Never   Smokeless tobacco: Never  Vaping Use   Vaping Use: Former  Substance Use Topics   Alcohol use: Not Currently   Drug use: Yes    Frequency: 7.0 times per week    Types: Marijuana    Comment: last used 07/08/2022  "2-3 times a day"    Allergies: No Known Allergies  Medications Prior to Admission  Medication Sig Dispense Refill Last Dose   Prenatal Vit-Fe  Fumarate-FA (PRENATAL VITAMIN PO) Take by mouth.   07/19/2022   valACYclovir (VALTREX) 500 MG tablet Take 500 mg by mouth 2 (two) times daily.   07/19/2022   calcium carbonate (TUMS EX) 750 MG chewable tablet Chew 1 tablet by mouth daily.      Results for orders placed or performed during the hospital encounter of 07/20/22 (from the past 48 hour(s))  Urinalysis, Routine w reflex microscopic -Urine, Clean Catch     Status: Abnormal   Collection Time: 07/20/22  8:46 AM  Result Value Ref Range   Color, Urine AMBER (A) YELLOW    Comment: BIOCHEMICALS MAY BE AFFECTED BY COLOR   APPearance HAZY (A) CLEAR   Specific Gravity, Urine 1.031 (H) 1.005 - 1.030   pH 5.0 5.0 - 8.0   Glucose, UA NEGATIVE NEGATIVE mg/dL   Hgb urine dipstick NEGATIVE NEGATIVE   Bilirubin Urine SMALL (A) NEGATIVE   Ketones, ur 80 (A) NEGATIVE mg/dL   Protein, ur 100 (A) NEGATIVE mg/dL   Nitrite NEGATIVE NEGATIVE   Leukocytes,Ua NEGATIVE  NEGATIVE   RBC / HPF 6-10 0 - 5 RBC/hpf   WBC, UA 6-10 0 - 5 WBC/hpf   Bacteria, UA RARE (A) NONE SEEN   Squamous Epithelial / HPF 11-20 0 - 5 /HPF   Mucus PRESENT     Comment: Performed at [facility], [place] [address], [place], [place] [address]  Comprehensive metabolic panel     Status: Abnormal   Collection Time: 07/20/22  9:47 AM  Result Value Ref Range   Sodium 135 135 - 145 mmol/L   Potassium 3.0 (L) 3.5 - 5.1 mmol/L   Chloride 102 98 - 111 mmol/L   CO2 20 (L) 22 - 32 mmol/L   Glucose, Bld 77 70 - 99 mg/dL    Comment: Glucose reference range applies only to samples taken after fasting for at least 8 hours.   BUN 5 (L) 6 - 20 mg/dL   Creatinine, Ser 0.58 0.44 - 1.00 mg/dL   Calcium 9.0 8.9 - 10.3 mg/dL   Total Protein 7.1 6.5 - 8.1 g/dL   Albumin 3.2 (L) 3.5 - 5.0 g/dL   AST 16 15 - 41 U/L   ALT 12 0 - 44 U/L   Alkaline Phosphatase 182 (H) 38 - 126 U/L   Total Bilirubin 0.3 0.3 - 1.2 mg/dL   GFR, Estimated >60 >60 mL/min    Comment: (NOTE) Calculated using the  CKD-EPI Creatinine Equation (2021)    Anion gap 13 5 - 15    Comment: Performed at [facility], [place] [address], [place],  [address]     Review of Systems  Constitutional:  Negative for fever.  Gastrointestinal:  Positive for abdominal pain, nausea and vomiting. Negative for diarrhea.  Genitourinary:  Negative for vaginal bleeding and vaginal discharge.   Physical Exam   Blood pressure 120/80, pulse (!) 107, temperature 97.8 F (36.6 C), temperature source Axillary, resp. rate 19, height 5' 8"$  (1.727 m), weight 104.1 kg, last menstrual period 10/19/2021.  Physical Exam Constitutional:      General: She is not in acute distress.    Appearance: Normal appearance. She is ill-appearing.  Genitourinary:    Comments: Dilation: 1 Effacement (%): 50 Cervical Position: Posterior Station: -3 Presentation: Vertex Exam by:: K. Cowher RN  Skin:    General: Skin is warm.  Neurological:     Mental Status: She is alert and oriented to person, place, and time.  Psychiatric:        Behavior: Behavior normal.    Fetal Tracing: Baseline: 130 bpm Variability: Moderate  Accelerations: 15x15 Decelerations: None Toco: UI  MAU Course  Procedures None  MDM  Urine shows >80 Ketones LR bolus x 2 Zofran & Phenergan given IV Patient requesting pain medication. Fentanyl 100 mcg given IV Patient now resting.  Will attempt to replace KDUR   Assessment and Plan   A:  1. Cannabis hyperemesis syndrome concurrent with and due to cannabis abuse ([place])   2. [redacted] weeks gestation of pregnancy   3. Braxton Hick's contraction   4. Hypokalemia      P:  Dc home Rx: Kdur, Zofran Return to MAU if symptoms worsen Labor precautions Stop using THC.  Lezlie Lye, NP 07/20/2022 12:23 PM

## 2022-07-21 ENCOUNTER — Telehealth: Payer: Self-pay | Admitting: *Deleted

## 2022-07-21 NOTE — Telephone Encounter (Signed)
Patient called stating she went to MAU twice this weekend for contractions. States she is still contracting and is "just miserable" and was told to call our office to possibly be set up for an induction.  Informed patient that elective inductions were not being scheduled anymore at this time and unless there was a medical indication, she would not be scheduled until 41 weeks. Expressed to patient that I understood her frustration and knew the contractions were tiring but encouraged to take Zofran as needed so that she could keep fluids down. Patient states she "was not going to keep the baby in her until then" and was going to go "back home".

## 2022-07-23 ENCOUNTER — Encounter: Payer: Self-pay | Admitting: Obstetrics & Gynecology

## 2022-08-22 IMAGING — US US PELVIS COMPLETE TRANSABD/TRANSVAG W DUPLEX
1 series · 13 of 25 positions shown · non-contrast
Comparison: None.

CLINICAL DATA: Right adnexal pain.

EXAM:
TRANSABDOMINAL AND TRANSVAGINAL ULTRASOUND OF PELVIS
DOPPLER ULTRASOUND OF OVARIES
TECHNIQUE: Both transabdominal and transvaginal ultrasound examinations of the
pelvis were performed. Transabdominal technique was performed for
global imaging of the pelvis including uterus, ovaries, adnexal
regions, and pelvic cul-de-sac.
It was necessary to proceed with endovaginal exam following the
transabdominal exam to visualize the uterus, endometrium, bilateral
ovaries and bilateral adnexa. Color and duplex Doppler ultrasound
was utilized to evaluate blood flow to the ovaries.

[Series 1: us pelvic complete w transvaginal and torsion righ · 13 of 110 slices shown]
[im 1/110]
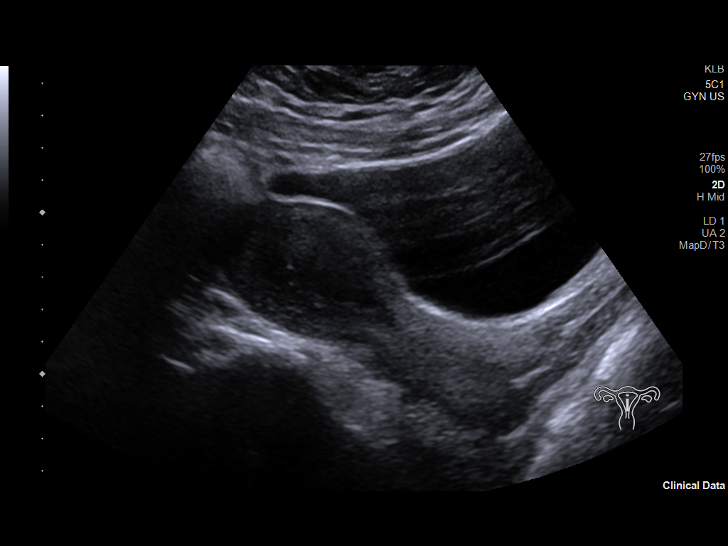
[im 10/110]
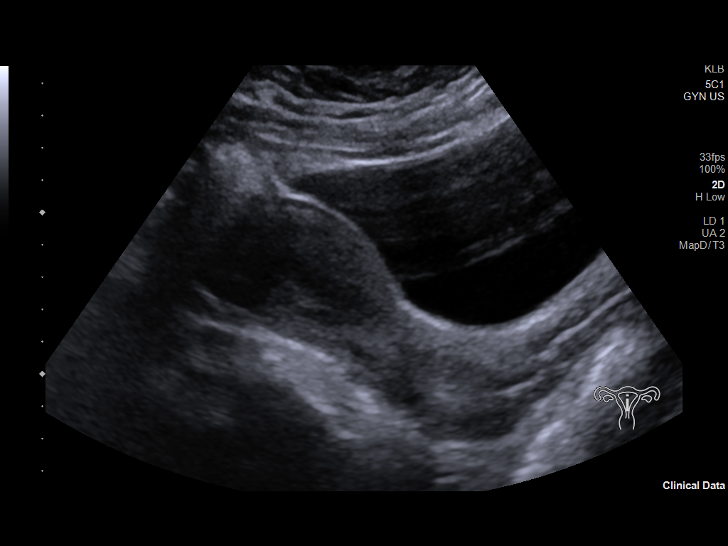
[im 19/110]
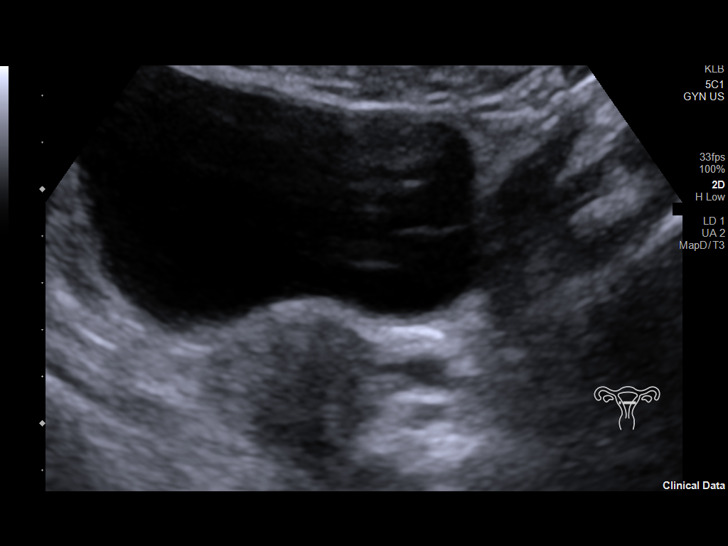
[im 28/110]
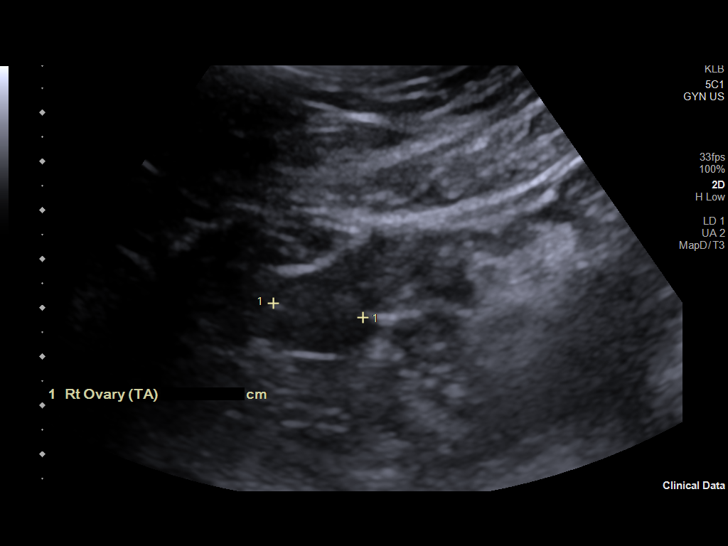
[im 37/110]
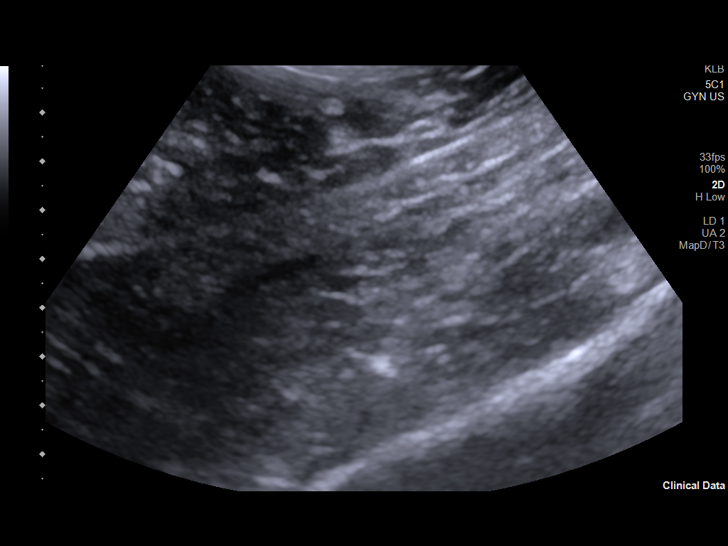
[im 46/110]
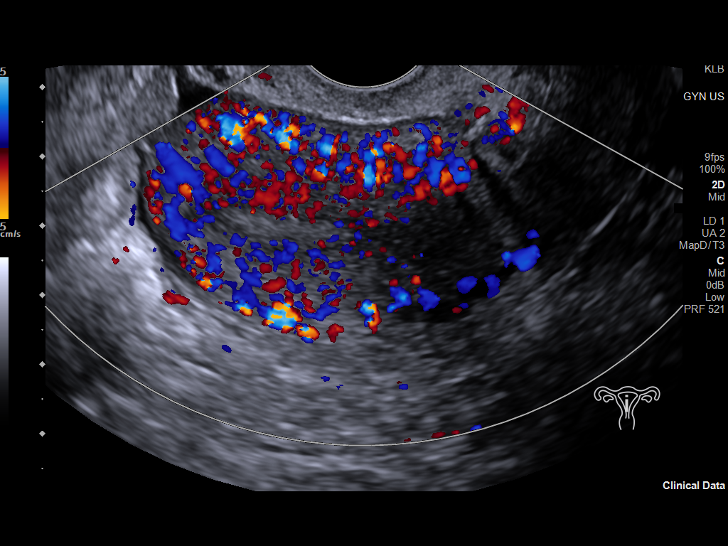
[im 55/110]
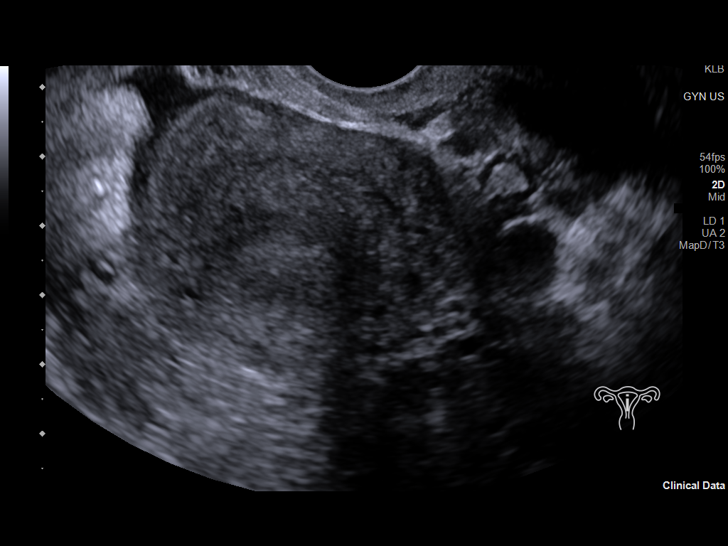
[im 64/110]
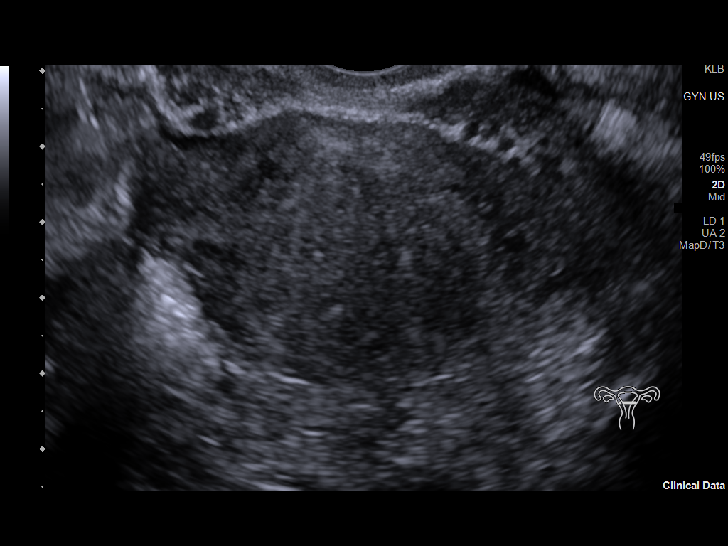
[im 73/110]
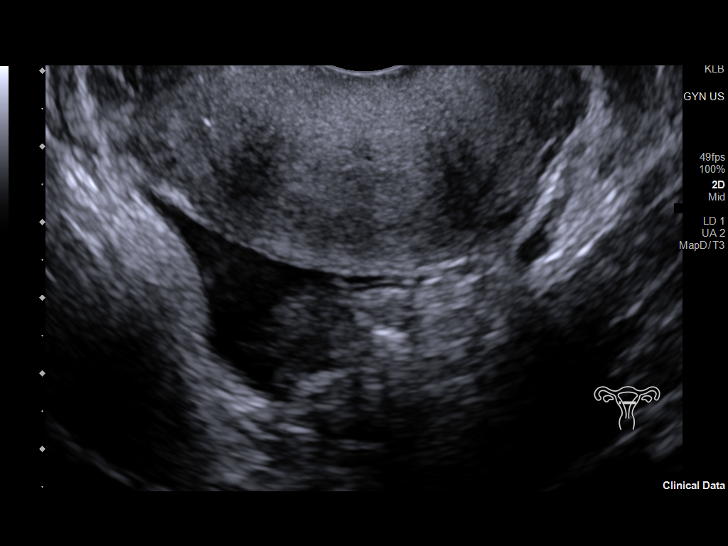
[im 82/110]
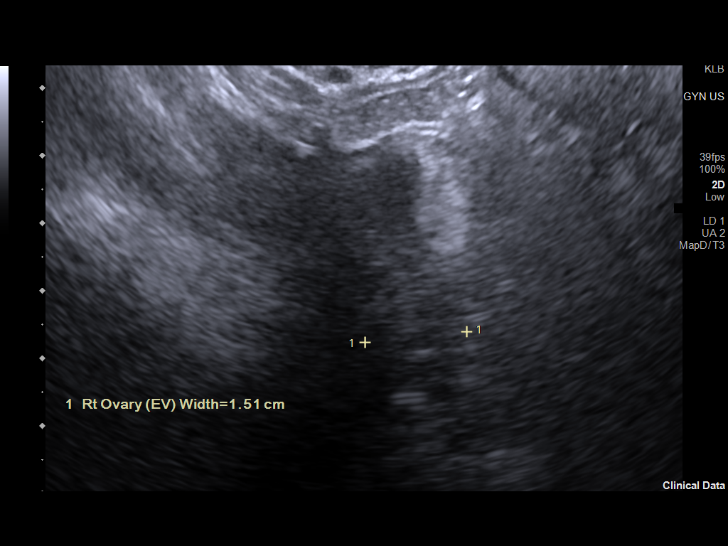
[im 91/110]
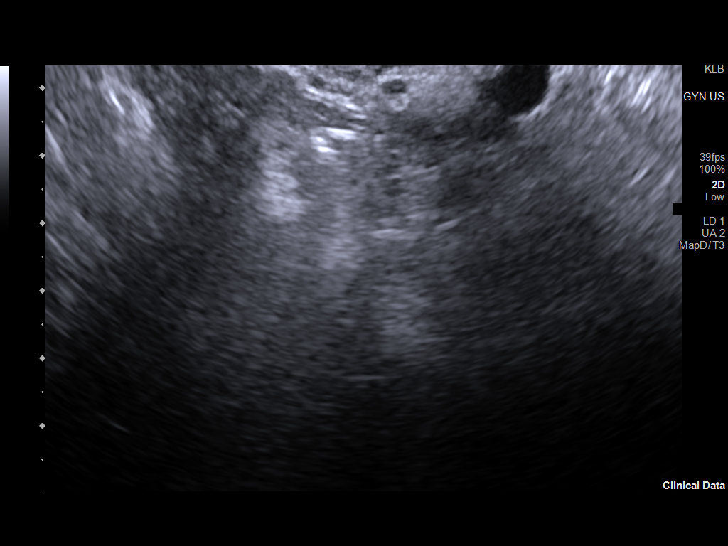
[im 100/110]
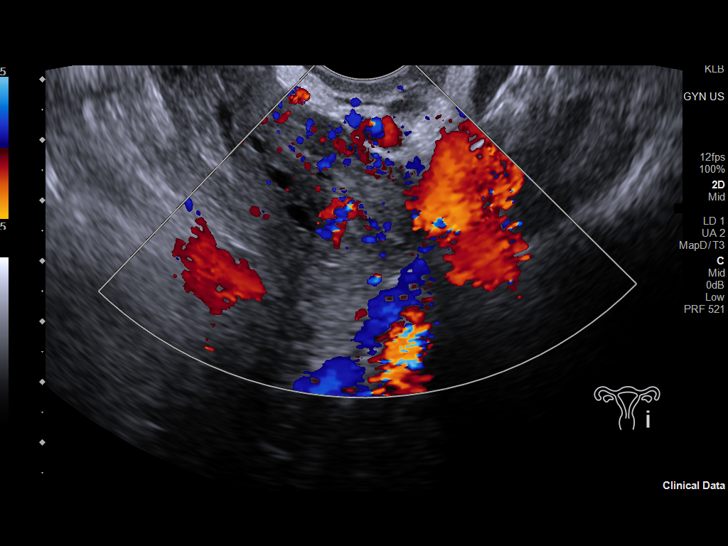
[im 110/110]
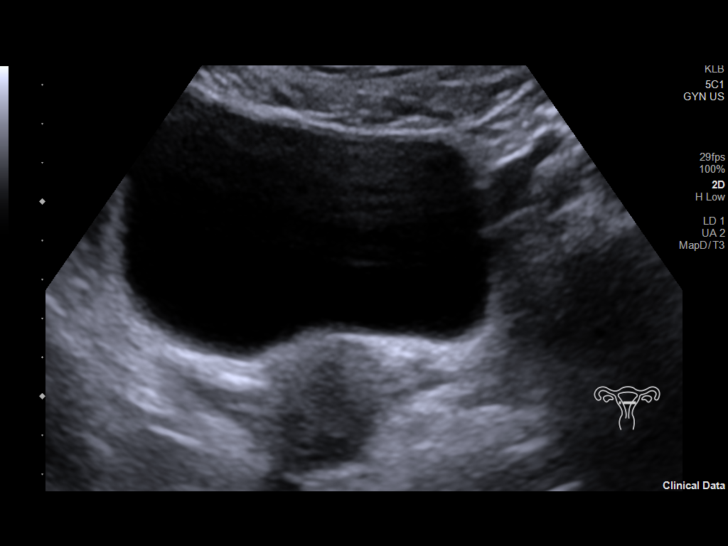

[13 of 25 positions shown; findings below may reference images not displayed]

FINDINGS: Uterus

Measurements: 8.3 cm x 4.1 cm x 4.5 cm = volume: 80 mL. No fibroids
or other mass visualized.

Endometrium

Thickness: 6.1 mm.  No focal abnormality visualized.

Right ovary

Measurements: 2.2 cm x 1.3 cm x 1.5 cm = volume: 2.1 mL. Normal
appearance/no adnexal mass.

Left ovary

Measurements: 2.8 cm x 1.8 cm x 1.8 cm = volume: 4.9 mL. Normal
appearance/no adnexal mass.

Pulsed Doppler evaluation of both ovaries demonstrates normal
low-resistance arterial and venous waveforms.

Other findings

A small amount of pelvic free fluid is seen.
IMPRESSION: 1. Small amount of pelvic free fluid which may be physiologic in
nature.
2. Otherwise unremarkable pelvic ultrasound.
